# Patient Record
Sex: Female | Born: 1950 | Race: White | Hispanic: No | Smoking: Current every day smoker
Health system: Southern US, Community
[De-identification: ages and names within clinical notes are randomized; demographics above are authoritative.]

## PROBLEM LIST (undated history)

## (undated) ENCOUNTER — Emergency Department (HOSPITAL_BASED_OUTPATIENT_CLINIC_OR_DEPARTMENT_OTHER): Admission: EM | Payer: PPO | Source: Home / Self Care

## (undated) DIAGNOSIS — J449 Chronic obstructive pulmonary disease, unspecified: Secondary | ICD-10-CM

## (undated) DIAGNOSIS — C50919 Malignant neoplasm of unspecified site of unspecified female breast: Secondary | ICD-10-CM

## (undated) DIAGNOSIS — M1990 Unspecified osteoarthritis, unspecified site: Secondary | ICD-10-CM

## (undated) DIAGNOSIS — B029 Zoster without complications: Secondary | ICD-10-CM

## (undated) DIAGNOSIS — T7840XA Allergy, unspecified, initial encounter: Secondary | ICD-10-CM

## (undated) DIAGNOSIS — Z9889 Other specified postprocedural states: Secondary | ICD-10-CM

## (undated) DIAGNOSIS — Z803 Family history of malignant neoplasm of breast: Secondary | ICD-10-CM

## (undated) DIAGNOSIS — K5790 Diverticulosis of intestine, part unspecified, without perforation or abscess without bleeding: Secondary | ICD-10-CM

## (undated) DIAGNOSIS — R112 Nausea with vomiting, unspecified: Secondary | ICD-10-CM

## (undated) DIAGNOSIS — K648 Other hemorrhoids: Secondary | ICD-10-CM

## (undated) DIAGNOSIS — F17200 Nicotine dependence, unspecified, uncomplicated: Secondary | ICD-10-CM

## (undated) DIAGNOSIS — M8580 Other specified disorders of bone density and structure, unspecified site: Secondary | ICD-10-CM

## (undated) DIAGNOSIS — E785 Hyperlipidemia, unspecified: Secondary | ICD-10-CM

## (undated) DIAGNOSIS — Z8049 Family history of malignant neoplasm of other genital organs: Secondary | ICD-10-CM

## (undated) HISTORY — PX: TONSILLECTOMY AND ADENOIDECTOMY: SUR1326

## (undated) HISTORY — PX: COLONOSCOPY: SHX174

## (undated) HISTORY — DX: Other specified disorders of bone density and structure, unspecified site: M85.80

## (undated) HISTORY — DX: Malignant neoplasm of unspecified site of unspecified female breast: C50.919

## (undated) HISTORY — DX: Unspecified osteoarthritis, unspecified site: M19.90

## (undated) HISTORY — DX: Allergy, unspecified, initial encounter: T78.40XA

## (undated) HISTORY — PX: TONSILLECTOMY: SUR1361

## (undated) HISTORY — DX: Chronic obstructive pulmonary disease, unspecified: J44.9

## (undated) HISTORY — DX: Zoster without complications: B02.9

## (undated) HISTORY — DX: Hyperlipidemia, unspecified: E78.5

## (undated) HISTORY — PX: BREAST LUMPECTOMY: SHX2

## (undated) HISTORY — PX: JOINT REPLACEMENT: SHX530

## (undated) HISTORY — DX: Diverticulosis of intestine, part unspecified, without perforation or abscess without bleeding: K57.90

## (undated) HISTORY — DX: Nicotine dependence, unspecified, uncomplicated: F17.200

## (undated) HISTORY — PX: BREAST BIOPSY: SHX20

## (undated) HISTORY — DX: Other hemorrhoids: K64.8

## (undated) HISTORY — PX: DILATION AND EVACUATION: SHX1459

## (undated) HISTORY — DX: Family history of malignant neoplasm of breast: Z80.3

## (undated) HISTORY — DX: Family history of malignant neoplasm of other genital organs: Z80.49

---

## 1997-05-03 ENCOUNTER — Ambulatory Visit (HOSPITAL_COMMUNITY): Admission: RE | Admit: 1997-05-03 | Discharge: 1997-05-03 | Payer: Self-pay | Admitting: Gynecology

## 1997-05-28 ENCOUNTER — Ambulatory Visit (HOSPITAL_BASED_OUTPATIENT_CLINIC_OR_DEPARTMENT_OTHER): Admission: RE | Admit: 1997-05-28 | Discharge: 1997-05-28 | Payer: Self-pay | Admitting: Surgery

## 1997-06-05 ENCOUNTER — Encounter: Admission: RE | Admit: 1997-06-05 | Discharge: 1997-09-03 | Payer: Self-pay | Admitting: Radiation Oncology

## 1997-06-06 ENCOUNTER — Ambulatory Visit (HOSPITAL_COMMUNITY): Admission: RE | Admit: 1997-06-06 | Discharge: 1997-06-07 | Payer: Self-pay | Admitting: Surgery

## 1997-11-21 ENCOUNTER — Encounter: Payer: Self-pay | Admitting: Surgery

## 1997-11-21 ENCOUNTER — Ambulatory Visit (HOSPITAL_COMMUNITY): Admission: RE | Admit: 1997-11-21 | Discharge: 1997-11-21 | Payer: Self-pay | Admitting: Surgery

## 1998-05-01 ENCOUNTER — Other Ambulatory Visit: Admission: RE | Admit: 1998-05-01 | Discharge: 1998-05-01 | Payer: Self-pay | Admitting: Gynecology

## 1998-05-12 ENCOUNTER — Ambulatory Visit (HOSPITAL_COMMUNITY): Admission: RE | Admit: 1998-05-12 | Discharge: 1998-05-12 | Payer: Self-pay | Admitting: Gynecology

## 1998-05-12 ENCOUNTER — Encounter: Payer: Self-pay | Admitting: Gynecology

## 1998-07-04 ENCOUNTER — Ambulatory Visit (HOSPITAL_BASED_OUTPATIENT_CLINIC_OR_DEPARTMENT_OTHER): Admission: RE | Admit: 1998-07-04 | Discharge: 1998-07-04 | Payer: Self-pay | Admitting: Surgery

## 1998-07-04 ENCOUNTER — Encounter: Payer: Self-pay | Admitting: Surgery

## 1998-07-28 ENCOUNTER — Encounter (INDEPENDENT_AMBULATORY_CARE_PROVIDER_SITE_OTHER): Payer: Self-pay | Admitting: Specialist

## 1998-07-28 ENCOUNTER — Other Ambulatory Visit: Admission: RE | Admit: 1998-07-28 | Discharge: 1998-07-28 | Payer: Self-pay | Admitting: Gynecology

## 1999-05-14 ENCOUNTER — Encounter: Payer: Self-pay | Admitting: Gynecology

## 1999-05-14 ENCOUNTER — Encounter: Admission: RE | Admit: 1999-05-14 | Discharge: 1999-05-14 | Payer: Self-pay | Admitting: Gynecology

## 1999-05-27 ENCOUNTER — Other Ambulatory Visit: Admission: RE | Admit: 1999-05-27 | Discharge: 1999-05-27 | Payer: Self-pay | Admitting: Gynecology

## 1999-09-25 ENCOUNTER — Encounter (INDEPENDENT_AMBULATORY_CARE_PROVIDER_SITE_OTHER): Payer: Self-pay | Admitting: Specialist

## 1999-09-25 ENCOUNTER — Other Ambulatory Visit: Admission: RE | Admit: 1999-09-25 | Discharge: 1999-09-25 | Payer: Self-pay | Admitting: Gynecology

## 2000-05-17 ENCOUNTER — Encounter: Admission: RE | Admit: 2000-05-17 | Discharge: 2000-05-17 | Payer: Self-pay | Admitting: Gynecology

## 2000-05-17 ENCOUNTER — Encounter: Payer: Self-pay | Admitting: Gynecology

## 2000-06-02 ENCOUNTER — Other Ambulatory Visit: Admission: RE | Admit: 2000-06-02 | Discharge: 2000-06-02 | Payer: Self-pay | Admitting: Internal Medicine

## 2001-05-18 ENCOUNTER — Encounter: Payer: Self-pay | Admitting: Family Medicine

## 2001-05-18 ENCOUNTER — Encounter: Admission: RE | Admit: 2001-05-18 | Discharge: 2001-05-18 | Payer: Self-pay | Admitting: Family Medicine

## 2001-06-02 ENCOUNTER — Other Ambulatory Visit: Admission: RE | Admit: 2001-06-02 | Discharge: 2001-06-02 | Payer: Self-pay | Admitting: Gynecology

## 2001-06-09 ENCOUNTER — Encounter: Payer: Self-pay | Admitting: Gynecology

## 2001-06-09 ENCOUNTER — Encounter: Admission: RE | Admit: 2001-06-09 | Discharge: 2001-06-09 | Payer: Self-pay | Admitting: Gynecology

## 2001-09-08 ENCOUNTER — Other Ambulatory Visit: Admission: RE | Admit: 2001-09-08 | Discharge: 2001-09-08 | Payer: Self-pay | Admitting: Gynecology

## 2001-11-27 ENCOUNTER — Other Ambulatory Visit: Admission: RE | Admit: 2001-11-27 | Discharge: 2001-11-27 | Payer: Self-pay | Admitting: Gynecology

## 2002-02-16 ENCOUNTER — Other Ambulatory Visit: Admission: RE | Admit: 2002-02-16 | Discharge: 2002-02-16 | Payer: Self-pay | Admitting: Gynecology

## 2002-06-21 ENCOUNTER — Other Ambulatory Visit: Admission: RE | Admit: 2002-06-21 | Discharge: 2002-06-21 | Payer: Self-pay | Admitting: Gynecology

## 2002-06-22 ENCOUNTER — Encounter: Admission: RE | Admit: 2002-06-22 | Discharge: 2002-06-22 | Payer: Self-pay | Admitting: Gynecology

## 2002-06-22 ENCOUNTER — Encounter: Payer: Self-pay | Admitting: Gynecology

## 2002-10-10 ENCOUNTER — Encounter: Payer: Self-pay | Admitting: Gastroenterology

## 2002-10-10 ENCOUNTER — Encounter: Admission: RE | Admit: 2002-10-10 | Discharge: 2002-10-10 | Payer: Self-pay | Admitting: Gastroenterology

## 2002-12-07 ENCOUNTER — Ambulatory Visit (HOSPITAL_COMMUNITY): Admission: RE | Admit: 2002-12-07 | Discharge: 2002-12-07 | Payer: Self-pay | Admitting: Gastroenterology

## 2002-12-19 ENCOUNTER — Ambulatory Visit (HOSPITAL_COMMUNITY): Admission: RE | Admit: 2002-12-19 | Discharge: 2002-12-19 | Payer: Self-pay | Admitting: Gastroenterology

## 2003-05-22 ENCOUNTER — Other Ambulatory Visit: Admission: RE | Admit: 2003-05-22 | Discharge: 2003-05-22 | Payer: Self-pay | Admitting: Gynecology

## 2003-05-24 ENCOUNTER — Encounter: Admission: RE | Admit: 2003-05-24 | Discharge: 2003-05-24 | Payer: Self-pay | Admitting: Oncology

## 2003-06-07 ENCOUNTER — Encounter: Admission: RE | Admit: 2003-06-07 | Discharge: 2003-06-07 | Payer: Self-pay | Admitting: Gynecology

## 2003-10-04 ENCOUNTER — Ambulatory Visit (HOSPITAL_COMMUNITY): Admission: RE | Admit: 2003-10-04 | Discharge: 2003-10-04 | Payer: Self-pay | Admitting: Gynecology

## 2004-05-18 ENCOUNTER — Ambulatory Visit: Payer: Self-pay | Admitting: Oncology

## 2004-05-19 ENCOUNTER — Other Ambulatory Visit: Admission: RE | Admit: 2004-05-19 | Discharge: 2004-05-19 | Payer: Self-pay | Admitting: Gynecology

## 2004-05-25 ENCOUNTER — Encounter: Admission: RE | Admit: 2004-05-25 | Discharge: 2004-05-25 | Payer: Self-pay | Admitting: Oncology

## 2005-05-20 ENCOUNTER — Other Ambulatory Visit: Admission: RE | Admit: 2005-05-20 | Discharge: 2005-05-20 | Payer: Self-pay | Admitting: Gynecology

## 2005-05-26 ENCOUNTER — Encounter: Admission: RE | Admit: 2005-05-26 | Discharge: 2005-05-26 | Payer: Self-pay | Admitting: Gynecology

## 2005-09-22 ENCOUNTER — Ambulatory Visit (HOSPITAL_COMMUNITY): Admission: RE | Admit: 2005-09-22 | Discharge: 2005-09-22 | Payer: Self-pay | Admitting: Gynecology

## 2006-03-03 ENCOUNTER — Emergency Department (HOSPITAL_COMMUNITY): Admission: EM | Admit: 2006-03-03 | Discharge: 2006-03-03 | Payer: Self-pay | Admitting: Family Medicine

## 2006-05-23 ENCOUNTER — Other Ambulatory Visit: Admission: RE | Admit: 2006-05-23 | Discharge: 2006-05-23 | Payer: Self-pay | Admitting: Gynecology

## 2006-05-24 ENCOUNTER — Encounter: Admission: RE | Admit: 2006-05-24 | Discharge: 2006-05-24 | Payer: Self-pay | Admitting: Gynecology

## 2007-05-26 ENCOUNTER — Other Ambulatory Visit: Admission: RE | Admit: 2007-05-26 | Discharge: 2007-05-26 | Payer: Self-pay | Admitting: Gynecology

## 2007-05-30 ENCOUNTER — Encounter: Admission: RE | Admit: 2007-05-30 | Discharge: 2007-05-30 | Payer: Self-pay | Admitting: Gynecology

## 2007-06-23 ENCOUNTER — Ambulatory Visit (HOSPITAL_COMMUNITY): Admission: RE | Admit: 2007-06-23 | Discharge: 2007-06-23 | Payer: Self-pay | Admitting: Gynecology

## 2007-06-27 ENCOUNTER — Encounter: Admission: RE | Admit: 2007-06-27 | Discharge: 2007-06-27 | Payer: Self-pay | Admitting: Gynecology

## 2007-11-10 ENCOUNTER — Ambulatory Visit: Payer: Self-pay | Admitting: Internal Medicine

## 2007-11-27 ENCOUNTER — Ambulatory Visit: Payer: Self-pay | Admitting: Internal Medicine

## 2007-11-27 LAB — HM COLONOSCOPY

## 2007-12-11 ENCOUNTER — Emergency Department (HOSPITAL_COMMUNITY): Admission: EM | Admit: 2007-12-11 | Discharge: 2007-12-11 | Payer: Self-pay | Admitting: Family Medicine

## 2008-05-27 ENCOUNTER — Encounter: Payer: Self-pay | Admitting: Gynecology

## 2008-05-27 ENCOUNTER — Encounter: Payer: Self-pay | Admitting: Internal Medicine

## 2008-05-27 ENCOUNTER — Other Ambulatory Visit: Admission: RE | Admit: 2008-05-27 | Discharge: 2008-05-27 | Payer: Self-pay | Admitting: Gynecology

## 2008-05-27 ENCOUNTER — Ambulatory Visit: Payer: Self-pay | Admitting: Gynecology

## 2008-05-30 ENCOUNTER — Encounter: Admission: RE | Admit: 2008-05-30 | Discharge: 2008-05-30 | Payer: Self-pay | Admitting: Gynecology

## 2008-08-27 ENCOUNTER — Encounter: Payer: Self-pay | Admitting: Internal Medicine

## 2008-08-27 LAB — CONVERTED CEMR LAB
Cholesterol: 234 mg/dL
HDL: 90 mg/dL
LDL (calc): 2.6 mg/dL
LDL Cholesterol: 134 mg/dL
TSH: 0.88 microintl units/mL
Triglyceride fasting, serum: 48 mg/dL

## 2008-11-04 ENCOUNTER — Ambulatory Visit: Payer: Self-pay | Admitting: Internal Medicine

## 2008-11-04 DIAGNOSIS — Z9189 Other specified personal risk factors, not elsewhere classified: Secondary | ICD-10-CM | POA: Insufficient documentation

## 2008-11-04 DIAGNOSIS — F17200 Nicotine dependence, unspecified, uncomplicated: Secondary | ICD-10-CM | POA: Insufficient documentation

## 2008-11-04 DIAGNOSIS — J449 Chronic obstructive pulmonary disease, unspecified: Secondary | ICD-10-CM | POA: Insufficient documentation

## 2008-11-04 DIAGNOSIS — M8580 Other specified disorders of bone density and structure, unspecified site: Secondary | ICD-10-CM

## 2008-11-05 ENCOUNTER — Encounter: Payer: Self-pay | Admitting: Internal Medicine

## 2009-05-29 ENCOUNTER — Other Ambulatory Visit: Admission: RE | Admit: 2009-05-29 | Discharge: 2009-05-29 | Payer: Self-pay | Admitting: Gynecology

## 2009-05-29 ENCOUNTER — Ambulatory Visit: Payer: Self-pay | Admitting: Gynecology

## 2009-06-04 ENCOUNTER — Encounter: Admission: RE | Admit: 2009-06-04 | Discharge: 2009-06-04 | Payer: Self-pay | Admitting: Gynecology

## 2009-10-30 ENCOUNTER — Encounter: Admission: RE | Admit: 2009-10-30 | Discharge: 2009-10-30 | Payer: Self-pay | Admitting: Gynecology

## 2010-01-01 ENCOUNTER — Ambulatory Visit (HOSPITAL_COMMUNITY)
Admission: RE | Admit: 2010-01-01 | Discharge: 2010-01-01 | Payer: Self-pay | Source: Home / Self Care | Attending: Gynecology | Admitting: Gynecology

## 2010-01-24 ENCOUNTER — Encounter: Payer: Self-pay | Admitting: Gastroenterology

## 2010-02-01 LAB — CONVERTED CEMR LAB: Pap Smear: NORMAL

## 2010-02-23 ENCOUNTER — Encounter: Payer: Self-pay | Admitting: Internal Medicine

## 2010-02-23 ENCOUNTER — Ambulatory Visit (INDEPENDENT_AMBULATORY_CARE_PROVIDER_SITE_OTHER): Payer: 59 | Admitting: Internal Medicine

## 2010-02-23 DIAGNOSIS — B029 Zoster without complications: Secondary | ICD-10-CM | POA: Insufficient documentation

## 2010-02-23 DIAGNOSIS — M949 Disorder of cartilage, unspecified: Secondary | ICD-10-CM

## 2010-03-03 NOTE — Assessment & Plan Note (Signed)
Summary: RASH / BUG BITE? / NWS #   Vital Signs:  Patient profile:   60 year old female Height:      66 inches (167.64 cm) Weight:      120.25 pounds (54.66 kg) BMI:     19.48 O2 Sat:      97 % on Room air Temp:     98.9 degrees F (37.17 degrees C) oral Pulse rate:   86 / minute Pulse rhythm:   regular BP sitting:   118 / 74  (left arm) Cuff size:   regular  Vitals Entered By: Brenton Grills CMA Duncan Dull) (February 23, 2010 10:53 AM)  O2 Flow:  Room air CC: Rash on buttocks x 9 days, redness, tender to touch/aj Is Patient Diabetic? No Comments Pt is no longer taking Evista (Joint pain)   Primary Care Provider:  Newt Lukes MD  CC:  Rash on buttocks x 9 days, redness, and tender to touch/aj.  History of Present Illness: c/o rash onset 10d ago located on right buttock denes outdoor exposure or known bug bite no rash elsewhere on body preceeded by itch and tingling - then red patch, then blisters and red nodules currently, dried scabs with mild  and tender pain, not swollen or red symptoms worse after hot bath and not improved with otc meds no fever no hx same  arthritis -  prior left knee pain and right rib pain all pain resolved within 2 weeks of stopping evista  osteopenia - follows with gyn for same - reviewed Ca and vit d recs  Clinical Review Panels:  Immunizations   Last Flu Vaccine:  Historical (09/04/2008)   Last Pneumovax:  Pneumovax (11/04/2008)  Lipid Management   Cholesterol:  234 (08/27/2008)   LDL (bad choesterol):  134 (08/27/2008)   HDL (good cholesterol):  90 (08/27/2008)   Triglycerides:  48 (08/27/2008)   Current Medications (verified): 1)  Evista 60 Mg Tabs (Raloxifene Hcl) .... Take 1 Po Qd 2)  Calcium Carbonate 600 Mg Tabs (Calcium Carbonate) .... Take 2 By Mouth Qd 3)  Flexamin W/vitamin D .... Take 1 By Mouth Qd 4)  Multivitamins  Tabs (Multiple Vitamin) .Marland Kitchen.. 1 By Mouth Once Daily  Allergies (verified): 1)  ! * Tetanus 2)  !  Codeine  Past History:  Past Medical History: tobacco abuse osteopenia OA - left knee  MD roster gyn - fernedez  Social History: Current Smoker married, lives with spouse  works in Water quality scientist for [employer] -  occ alcohol use - social  Review of Systems  The patient denies fever, weight loss, chest pain, and abdominal pain.    Physical Exam  General:  thin, alert, well-developed, well-nourished, and cooperative to examination.   tobacco smell in room Lungs:  normal respiratory effort, no intercostal retractions or use of accessory muscles; normal breath sounds bilaterally - no crackles and no wheezes.    Heart:  normal rate, regular rhythm, no murmur, and no rub. BLE without edema. normal DP pulses and normal cap refill in all 4 extremities    Skin:  resolving patch of shingles blisters localized over 4x6cm patch over right buttock - no cellulitis or oozing   Impression & Recommendations:  Problem # 1:  SHINGLES (ICD-053.9)  minimally symptomatic already past stage for antivirals given >1 week outbreak use topical steroids as needed - erx done no evidence for PHN at this time but pt educated on risk of same  Orders: Prescription Created Electronically [phone])  Problem #  2:  OSTEOPENIA (ICD-733.90)  The following medications were removed from the medication list:    Evista 60 Mg Tabs (Raloxifene hcl) .Marland Kitchen... Take 1 po qd  followed and tx for same by gyn -  intol of evista rec vit d 2000u/d and cal citrate until further f/u with gyn done  Complete Medication List: 1)  Calcium Carbonate 600 Mg Tabs (Calcium carbonate) .... Take 2 by mouth once daily 2)  Flexamin W/vitamin D  .... Take 1 by mouth qd 3)  Multivitamins Tabs (Multiple vitamin) .Marland Kitchen.. 1 by mouth once daily 4)  Betamethasone Dipropionate 0.05 % Oint (Betamethasone dipropionate) .... Apply to affected skin two times a day as needed  Patient Instructions: 1)  it was good to see you today. 2)  rash is an  outbreak of shingles - use steroid ointment as needed but no other treatment recommended at this time 3)  you are not contagious at this time 4)  your prescription has been electronically submitted to your pharmacy. Please take as directed. Contact our office if you believe you're having problems with the medication(s).  5)  Please schedule a follow-up appointment annually for physical and labs, call sooner if problems.  Prescriptions: BETAMETHASONE DIPROPIONATE 0.05 % OINT (BETAMETHASONE DIPROPIONATE) apply to affected skin two times a day as needed  #1 x 0   Entered and Authorized by:   Newt Lukes MD   Signed by:   Newt Lukes MD on 02/23/2010   Method used:   Electronically to        [facility]* (retail)       [address]       [address] Shipping/mailing       [place], [place]  [address]       Ph: [phone]       Fax: [phone]   RxID:   [id number]    Orders Added: 1)  Est. Patient Level IV [27253] 2)  Prescription Created Electronically [phone]

## 2010-03-06 ENCOUNTER — Encounter: Payer: Self-pay | Admitting: Internal Medicine

## 2010-03-06 ENCOUNTER — Ambulatory Visit (INDEPENDENT_AMBULATORY_CARE_PROVIDER_SITE_OTHER): Payer: 59 | Admitting: Internal Medicine

## 2010-03-06 DIAGNOSIS — J180 Bronchopneumonia, unspecified organism: Secondary | ICD-10-CM

## 2010-03-12 NOTE — Assessment & Plan Note (Signed)
Summary: BRONCHITIS /NWS   Vital Signs:  Patient profile:   60 year old female O2 Sat:      97 % on Room air Temp:     101.3 degrees F (38.50 degrees C) oral Pulse rate:   93 / minute BP sitting:   122 / 70  (left arm) Cuff size:   regular  Vitals Entered By: Orlan Leavens RMA (March 06, 2010 11:45 AM)  O2 Flow:  Room air CC: ? Bronchitis, URI symptoms Is Patient Diabetic? No Pain Assessment Patient in pain? no        Primary Care Provider:  Newt Lukes MD  CC:  ? Bronchitis and URI symptoms.  History of Present Illness:  URI Symptoms      This is a 60 year old woman who presents with URI symptoms.  The symptoms began 4 days ago.  The severity is described as moderate.  The patient reports sore throat, dry cough, productive cough, and sick contacts, but denies nasal congestion, clear nasal discharge, purulent nasal discharge, and earache.  Associated symptoms include fever of 100.5-103 degrees.  The patient denies stiff neck, dyspnea, wheezing, rash, vomiting, and diarrhea.  The patient also reports muscle aches.  The patient denies itchy throat, sneezing, seasonal symptoms, response to antihistamine, headache, and severe fatigue.  The patient denies the following risk factors for Strep sinusitis: double sickening, tooth pain, Strep exposure, tender adenopathy, and absence of cough.    also reviewed chronic med issuesL arthritis -  prior left knee pain and right rib pain all pain resolved within 2 weeks of stopping evista  osteopenia - follows with gyn for same - reviewed Ca and vit d recs  Current Medications (verified): 1)  Calcium Carbonate 600 Mg Tabs (Calcium Carbonate) .... Take 2 By Mouth Once Daily 2)  Flexamin W/vitamin D .... Take 1 By Mouth Qd 3)  Multivitamins  Tabs (Multiple Vitamin) .Marland Kitchen.. 1 By Mouth Once Daily 4)  Betamethasone Dipropionate 0.05 % Oint (Betamethasone Dipropionate) .... Apply To Affected Skin Two Times A Day As Needed  Allergies  (verified): 1)  ! * Tetanus 2)  ! Codeine  Past History:  Past Medical History: tobacco abuse osteopenia OA - left knee  MD roster: gyn - fernedez  Review of Systems  The patient denies chest pain, syncope, headaches, and hemoptysis.    Physical Exam  General:  thin, alert, well-developed, well-nourished, and cooperative to examination. tired and ill appearing.  tobacco smell in room Eyes:  vision grossly intact; pupils equal, round and reactive to light.  conjunctiva and lids normal.    Ears:  normal pinnae bilaterally, without erythema, swelling, or tenderness to palpation. TMs clear, without effusion, or cerumen impaction. Hearing grossly normal bilaterally  Mouth:  teeth and gums in good repair; mucous membranes moist, without lesions or ulcers. oropharynx clear without exudate, mod erythema. tiny erythematous nodule (1mm) on left side of OP - noninflammed - no tonsils Lungs:  coarse bronchial BS bilaterally with deep cough but no crackle or wheeze Heart:  normal rate, regular rhythm, no murmur, and no rub. BLE without edema. Psych:  Oriented X3, memory intact for recent and remote, normally interactive, good eye contact, not anxious appearing, not depressed appearing, and not agitated.      Impression & Recommendations:  Problem # 1:  BRONCHOPNEUMONIA (ICD-485)  fever but O2 sat ok - wll forgo cxr as results would not change tx plan for infx: 10d FQ cont otc cough med (declines  offered rx for narc cough suppressant), hydration callif unimproved, sooner if worse again reminded to stop smoking! Her updated medication list for this problem includes:    Levaquin 750 Mg Tabs (Levofloxacin) .Marland Kitchen... 1 by mouth once daily  Instructed patient to complete antibiotics, and call for worsened shortness of breath or new symptoms.   Orders: Prescription Created Electronically [phone])  Complete Medication List: 1)  Calcium Carbonate 600 Mg Tabs (Calcium carbonate) .... Take 2 by  mouth once daily 2)  Flexamin W/vitamin D  .... Take 1 by mouth qd 3)  Multivitamins Tabs (Multiple vitamin) .Marland Kitchen.. 1 by mouth once daily 4)  Betamethasone Dipropionate 0.05 % Oint (Betamethasone dipropionate) .... Apply to affected skin two times a day as needed 5)  Levaquin 750 Mg Tabs (Levofloxacin) .Marland Kitchen.. 1 by mouth once daily 6)  Robitussin Dm 100-10 Mg/66ml Syrp (Dextromethorphan-guaifenesin) .... As needed for cough  Patient Instructions: 1)  it was good to see you today. 2)  Levaquin for bronchopneumonia symptoms - your prescription has been electronically submitted to your pharmacy. Please take as directed. Contact our office if you believe you're having problems with the medication(s).  3)  Get plenty of rest, drink lots of clear liquids, and use Tylenol or Ibuprofen for fever and comfort. Robitussin DM for cough - call if something stronger is needed. Return in 7-10 days if you're not better:sooner if you're feeling worse. Prescriptions: LEVAQUIN 750 MG TABS (LEVOFLOXACIN) 1 by mouth once daily  #10 x 0   Entered and Authorized by:   Newt Lukes MD   Signed by:   Newt Lukes MD on 03/06/2010   Method used:   Electronically to        [facility]* (retail)       [address]       [address] Shipping/mailing       [place], [place]  [address]       Ph: [phone]       Fax: [phone]   RxID:   [id number]    Orders Added: 1)  Est. Patient Level IV [56387] 2)  Prescription Created Electronically [phone]

## 2010-03-16 ENCOUNTER — Other Ambulatory Visit: Payer: Self-pay | Admitting: Dermatology

## 2010-05-22 NOTE — Op Note (Signed)
NAME:  Tanya [name], Tanya [name]                         ACCOUNT NO.:  [id number]   MEDICAL RECORD NO.:  [id number]                   PATIENT TYPE:  AMB   LOCATION:  ENDO                                 FACILITY:   PHYSICIAN:  Anselmo Rod, M.D.               DATE OF BIRTH:  [birth year]   DATE OF PROCEDURE:  12/07/2002  DATE OF DISCHARGE:                                 OPERATIVE REPORT   PROCEDURE PERFORMED:  Screening colonoscopy.   ENDOSCOPIST:  Anselmo Rod, M.D.   INSTRUMENT USED:  Olympus video colonoscope changed to a pediatric  adjustable colonoscope.   INDICATIONS FOR PROCEDURE:  A 60 year old white female with a personal  history of breast cancer, rectal bleeding undergoing screening colonoscopy  to rule out colonic polyps, masses, etc.   PREPROCEDURE PREPARATION:  Informed consent was procured from the patient.  The patient was fasted for eight hours prior to procedure and prepped with a  bottle of magnesium citrate and gallon of GoLYTELY the night prior to  procedure.   PREPROCEDURE PHYSICAL:  VITAL SIGNS:  The patient has stable vital signs.  NECK:  Supple.  CHEST:  Clear to auscultation.  CARDIAC:  S1, S2 regular.  ABDOMEN:  Soft with normal bowel sounds.   DESCRIPTION OF PROCEDURE:  The patient was placed in the left lateral  decubitus position and sedated with 80 mg of Demerol and 8 mg of Versed  intravenously. Once the patient was adequately sedated and maintained on low-  flow oxygen and continuous cardiac monitoring, the Olympus video colonoscope  was advanced from the rectum to the hepatic flexure with difficulty and  therefore this was withdrawn and the peds adjustable colonoscope was used  instead.  The patient's position was changed from the left decubitus to  spine.  The patient positioned with gentle application of abdominal pressure  to reach the cecum.  The appendicular orifice and ileocecal valve were  clearly visualized and photographed.  The  terminal ileum appeared normal and  without lesion.  Retroflexion in the rectum revealed small internal  hemorrhoids which I suspect is the patient's source of rectal bleeding.  There was some extrinsic compression of the rectum, but the reason for this  is unclear to me.  The patient tolerated the procedure well without  complication.   IMPRESSION:  1. Small internal hemorrhoid.  2. Extrinsic compression of the rectum. Question uterine fibroid.  Symptoms     __________.  Etiology unclear.  3. No masses, polyps or diverticula seen.  4. Normal appearing colonic mucosa up to terminal ileum.   RECOMMENDATIONS:  1. Pelvic ultrasound with next gynecologic evaluation.  2. High-fiber diet with liberal fluid intake.  3. Repeat CRC screening in the next five years unless the patient develops     any abnormal symptoms in the interim.  4. Outpatient followup as need arises in the future.  Anselmo Rod, M.D.    JNM/MEDQ  D:  12/07/2002  T:  12/07/2002  Job:  [id number]   cc:   Gaetano Hawthorne. Lily Peer, M.D.  [address]  [place]  [place] [address]  Fax: [phone]   Mosetta Putt, M.D.  [address] [place]  [place] [address]  Fax: [phone]   Genene Churn. Cyndie Chime, M.D.  [address] [facility]  [place]  [place] [address]  Fax: [phone]

## 2010-05-29 ENCOUNTER — Other Ambulatory Visit: Payer: Self-pay | Admitting: Gynecology

## 2010-05-29 DIAGNOSIS — Z1231 Encounter for screening mammogram for malignant neoplasm of breast: Secondary | ICD-10-CM

## 2010-06-22 ENCOUNTER — Other Ambulatory Visit: Payer: 59

## 2010-06-22 ENCOUNTER — Encounter: Payer: 59 | Admitting: Gynecology

## 2010-06-23 ENCOUNTER — Ambulatory Visit
Admission: RE | Admit: 2010-06-23 | Discharge: 2010-06-23 | Disposition: A | Discharge status: Home / Self Care | Payer: 59 | Source: Ambulatory Visit | Attending: Gynecology | Admitting: Gynecology

## 2010-06-23 DIAGNOSIS — Z1231 Encounter for screening mammogram for malignant neoplasm of breast: Secondary | ICD-10-CM

## 2010-07-02 ENCOUNTER — Encounter: Payer: 59 | Admitting: Gynecology

## 2010-07-03 ENCOUNTER — Other Ambulatory Visit: Payer: Self-pay | Admitting: Gynecology

## 2010-07-03 ENCOUNTER — Encounter (INDEPENDENT_AMBULATORY_CARE_PROVIDER_SITE_OTHER): Payer: 59 | Admitting: Gynecology

## 2010-07-03 ENCOUNTER — Other Ambulatory Visit: Payer: 59

## 2010-07-03 ENCOUNTER — Other Ambulatory Visit (HOSPITAL_COMMUNITY)
Admission: RE | Admit: 2010-07-03 | Discharge: 2010-07-03 | Disposition: A | Discharge status: Home / Self Care | Payer: 59 | Source: Ambulatory Visit | Attending: Gynecology | Admitting: Gynecology

## 2010-07-03 DIAGNOSIS — R634 Abnormal weight loss: Secondary | ICD-10-CM

## 2010-07-03 DIAGNOSIS — Z124 Encounter for screening for malignant neoplasm of cervix: Secondary | ICD-10-CM | POA: Insufficient documentation

## 2010-07-03 DIAGNOSIS — Z01419 Encounter for gynecological examination (general) (routine) without abnormal findings: Secondary | ICD-10-CM

## 2010-07-03 DIAGNOSIS — Z833 Family history of diabetes mellitus: Secondary | ICD-10-CM

## 2010-07-03 DIAGNOSIS — Z13220 Encounter for screening for lipoid disorders: Secondary | ICD-10-CM

## 2010-07-03 DIAGNOSIS — D259 Leiomyoma of uterus, unspecified: Secondary | ICD-10-CM

## 2010-07-03 DIAGNOSIS — Z1211 Encounter for screening for malignant neoplasm of colon: Secondary | ICD-10-CM

## 2010-07-03 DIAGNOSIS — R823 Hemoglobinuria: Secondary | ICD-10-CM

## 2010-07-03 LAB — LIPID PANEL
Cholesterol: 225 mg/dL — AB (ref 0–200)
HDL: 100 mg/dL — AB (ref 35–70)

## 2010-07-03 LAB — CBC AND DIFFERENTIAL: HCT: 45 % (ref 36–46)

## 2010-07-03 LAB — TSH: TSH: 0.87 u[IU]/mL (ref 0.41–5.90)

## 2010-07-10 ENCOUNTER — Encounter: Payer: Self-pay | Admitting: *Deleted

## 2010-07-13 ENCOUNTER — Encounter: Payer: Self-pay | Admitting: Internal Medicine

## 2011-01-22 ENCOUNTER — Other Ambulatory Visit: Payer: Self-pay | Admitting: Gynecology

## 2011-05-26 ENCOUNTER — Other Ambulatory Visit: Payer: Self-pay | Admitting: Gynecology

## 2011-05-26 ENCOUNTER — Telehealth: Payer: Self-pay | Admitting: *Deleted

## 2011-05-26 DIAGNOSIS — Z1231 Encounter for screening mammogram for malignant neoplasm of breast: Secondary | ICD-10-CM

## 2011-05-26 DIAGNOSIS — Z0000 Encounter for general adult medical examination without abnormal findings: Secondary | ICD-10-CM

## 2011-05-26 NOTE — Telephone Encounter (Signed)
Besides her ultrasound she would have a CBC, fasting lipid profile, fasting blood sugar and urinalysis and TSH for her visit.

## 2011-05-26 NOTE — Telephone Encounter (Signed)
Pt has annual schedule for June 5th and would like to know if she will have pelvic ultrasound done this year as well? Pt said the reason for ultrasound was due to history of breast cancer. She would also like to know if additional blood work other than fasting cholesterol were going to be order? Please advise

## 2011-05-27 NOTE — Telephone Encounter (Signed)
Pt informed with the below, orders placed in computer.

## 2011-06-07 ENCOUNTER — Other Ambulatory Visit: Payer: Self-pay | Admitting: Gynecology

## 2011-06-07 DIAGNOSIS — Z853 Personal history of malignant neoplasm of breast: Secondary | ICD-10-CM

## 2011-06-09 ENCOUNTER — Encounter: Payer: Self-pay | Admitting: Gynecology

## 2011-06-09 ENCOUNTER — Other Ambulatory Visit: Payer: 59

## 2011-06-09 ENCOUNTER — Ambulatory Visit (INDEPENDENT_AMBULATORY_CARE_PROVIDER_SITE_OTHER): Payer: 59 | Admitting: Gynecology

## 2011-06-09 ENCOUNTER — Ambulatory Visit (INDEPENDENT_AMBULATORY_CARE_PROVIDER_SITE_OTHER): Payer: 59

## 2011-06-09 VITALS — BP 118/70 | Ht 64.5 in | Wt 118.0 lb

## 2011-06-09 DIAGNOSIS — Z0000 Encounter for general adult medical examination without abnormal findings: Secondary | ICD-10-CM

## 2011-06-09 DIAGNOSIS — Z01419 Encounter for gynecological examination (general) (routine) without abnormal findings: Secondary | ICD-10-CM

## 2011-06-09 DIAGNOSIS — D251 Intramural leiomyoma of uterus: Secondary | ICD-10-CM

## 2011-06-09 DIAGNOSIS — N83339 Acquired atrophy of ovary and fallopian tube, unspecified side: Secondary | ICD-10-CM

## 2011-06-09 DIAGNOSIS — C50912 Malignant neoplasm of unspecified site of left female breast: Secondary | ICD-10-CM | POA: Insufficient documentation

## 2011-06-09 DIAGNOSIS — Z853 Personal history of malignant neoplasm of breast: Secondary | ICD-10-CM

## 2011-06-09 DIAGNOSIS — D259 Leiomyoma of uterus, unspecified: Secondary | ICD-10-CM

## 2011-06-09 LAB — CBC
Hemoglobin: 15.1 g/dL — ABNORMAL HIGH (ref 12.0–15.0)
MCH: 33.7 pg (ref 26.0–34.0)
MCHC: 33.7 g/dL (ref 30.0–36.0)
Platelets: 173 10*3/uL (ref 150–400)
RBC: 4.48 MIL/uL (ref 3.87–5.11)

## 2011-06-09 LAB — LIPID PANEL: Total CHOL/HDL Ratio: 2.9 Ratio

## 2011-06-09 LAB — TSH: TSH: 1.39 u[IU]/mL (ref 0.350–4.500)

## 2011-06-09 LAB — GLUCOSE, RANDOM: Glucose, Bld: 89 mg/dL (ref 70–99)

## 2011-06-09 NOTE — Progress Notes (Signed)
Tanya [name] 06/11/1950 [id number]   History:    61 y.o.  for annual gyn exam with no complaints. 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 Patient's treatment consisted of a left lumpectomy along with radiation and 5 years of tamoxifen. Patient currently on Evista 60 mg daily for osteopenia. Patient is a chronic smoker of 20 years. Patient had a normal chest x-ray in 2011. Her last mammogram was in June of 2012 normal. Patient does her monthly self breast examination. Last bone density study October 2011 with her lowest T score at the left femoral neck -1.3 (decreased bone mineralization/osteopenia). Patient currently taking calcium and vitamin D twice a day. Last colonoscopy was normal in 2009. In 2012 patient had outbreak of shingles and has not had a vaccine yet.  Patient had ultrasound today as a result of prior history of uterine fibroids and history of breast cancer for assessment of her ovaries. Ultrasound demonstrated a uterus that measured 5.5 x 4.1 x by 2.3 cm with endometrial stripe of 1.4 mm 2 small less than 1 cm fibroids were noted. Right and left ovary were atrophic.  Past medical history,surgical history, family history and social history were all reviewed and documented in the EPIC chart.  Gynecologic History No LMP recorded. Patient is postmenopausal. Contraception: none Last Pap: 2012. Results were: normal Last mammogram: 2012. Results were: normal  Obstetric History OB History    Grav Para Term Preterm Abortions TAB SAB Ect Mult Living   3 0   2  1   0     # Outc Date GA Lbr Len/2nd Wgt Sex Del Anes PTL Lv   1 GRA            2 ABT            3 SAB                ROS: A ROS was performed and pertinent positives and negatives are included in the history.  GENERAL: No fevers or chills. HEENT: No change in vision, no earache, sore throat or sinus congestion. NECK: No pain or stiffness. CARDIOVASCULAR: No  chest pain or pressure. No palpitations. PULMONARY: No shortness of breath, cough or wheeze. GASTROINTESTINAL: No abdominal pain, nausea, vomiting or diarrhea, melena or bright red blood per rectum. GENITOURINARY: No urinary frequency, urgency, hesitancy or dysuria. MUSCULOSKELETAL: No joint or muscle pain, no back pain, no recent trauma. DERMATOLOGIC: No rash, no itching, no lesions. ENDOCRINE: No polyuria, polydipsia, no heat or cold intolerance. No recent change in weight. HEMATOLOGICAL: No anemia or easy bruising or bleeding. NEUROLOGIC: No headache, seizures, numbness, tingling or weakness. PSYCHIATRIC: No depression, no loss of interest in normal activity or change in sleep pattern.     Exam: chaperone present  BP 118/70  Ht 5' 4.5" (1.638 m)  Wt 118 lb (53.524 kg)  BMI 19.94 kg/m2  Body mass index is 19.94 kg/(m^2).  General appearance : Well developed well nourished female. No acute distress HEENT: Neck supple, trachea midline, no carotid bruits, no thyroidmegaly Lungs: Clear to auscultation, no rhonchi or wheezes, or rib retractions  Heart: Regular rate and rhythm, no murmurs or gallops Breast:Examined in sitting and supine position were symmetrical in appearance, no palpable masses or tenderness,  no skin retraction, no nipple inversion, no nipple discharge, no skin discoloration, no axillary or supraclavicular lymphadenopathy Abdomen: no palpable masses or tenderness, no rebound or guarding Extremities: no edema or skin  discoloration or tenderness  Pelvic:  Bartholin, Urethra, Skene Glands: Within normal limits             Vagina: No gross lesions or discharge  Cervix: No gross lesions or discharge  Uterus  anteverted, normal size, shape and consistency, non-tender and mobile  Adnexa  Without masses or tenderness  Anus and perineum  normal   Rectovaginal  normal sphincter tone without palpated masses or tenderness             Hemoccult cards provided for patient's mid to the  office for testing     Assessment/Plan:  61 y.o. female for annual exam with prior history of ductal carcinoma in situ with microinvasion in 1999 has done well. Patient continues to smoke at least one pack cigarette per day despite numerous times that she has been counseled and offered treatment but has declined. Chest x-ray normal 2 years ago. She scheduled for mammogram in the next few weeks. Her next bone density study is due at the end of the year. She was instructed to continue to take her calcium and vitamin D twice a day and to engage in some form of weightbearing exercises. The following labs were drawn today: CBC, fasting lipid profile, fasting blood sugar, TSH and urinalysis. New past year screening guidelines discussed. No Pap smear done today. Literature information on smoking cessation was provided. Fecal occult blood testing card provided her to submit to the office at a later date for testing. Patient to continue her Evista 60 mg daily for osteopenia. Prescription was provided for the shingles vaccine.   Ok Edwards MD, 10:58 AM 06/09/2011

## 2011-06-10 LAB — URINALYSIS W MICROSCOPIC + REFLEX CULTURE
Hgb urine dipstick: NEGATIVE
Leukocytes, UA: NEGATIVE
Nitrite: NEGATIVE
Protein, ur: NEGATIVE mg/dL
Urobilinogen, UA: 0.2 mg/dL (ref 0.0–1.0)
pH: 7.5 (ref 5.0–8.0)

## 2011-06-17 ENCOUNTER — Ambulatory Visit: Payer: 59

## 2011-06-18 ENCOUNTER — Ambulatory Visit
Admission: RE | Admit: 2011-06-18 | Discharge: 2011-06-18 | Disposition: A | Discharge status: Home / Self Care | Payer: 59 | Source: Ambulatory Visit | Attending: Gynecology | Admitting: Gynecology

## 2011-06-18 DIAGNOSIS — Z1231 Encounter for screening mammogram for malignant neoplasm of breast: Secondary | ICD-10-CM

## 2011-07-21 ENCOUNTER — Other Ambulatory Visit: Payer: Self-pay | Admitting: Gynecology

## 2011-08-01 ENCOUNTER — Emergency Department (INDEPENDENT_AMBULATORY_CARE_PROVIDER_SITE_OTHER): Payer: 59

## 2011-08-01 ENCOUNTER — Encounter (HOSPITAL_COMMUNITY): Payer: Self-pay | Admitting: Emergency Medicine

## 2011-08-01 ENCOUNTER — Emergency Department (HOSPITAL_COMMUNITY)
Admission: EM | Admit: 2011-08-01 | Discharge: 2011-08-01 | Disposition: A | Discharge status: Home / Self Care | Payer: 59 | Source: Home / Self Care | Attending: Emergency Medicine | Admitting: Emergency Medicine

## 2011-08-01 DIAGNOSIS — S7000XA Contusion of unspecified hip, initial encounter: Secondary | ICD-10-CM

## 2011-08-01 DIAGNOSIS — S7002XA Contusion of left hip, initial encounter: Secondary | ICD-10-CM

## 2011-08-01 MED ORDER — HYDROCODONE-ACETAMINOPHEN 5-325 MG PO TABS: ORAL_TABLET | ORAL | Status: AC

## 2011-08-01 NOTE — ED Notes (Signed)
Pt was sitting in a dinning room chair when her husband stumbled into her, knocking her onto the floor onto her left hip. Began having pain during the night. States pain is with walking only.

## 2011-08-01 NOTE — ED Provider Notes (Signed)
Chief Complaint  Patient presents with  . Hip Pain    History of Present Illness:   Tanya [name] is a 61 year old female who injured her left hip last night. She was sitting in the chair and her husband tripped, bumped into the chair, not the chair over, and she ended up falling sideways on her left side. She did not hit her head and there was no loss of consciousness. She did thereafter have some pain in her left hip in the groin area. She's able to walk but with a limp. She denies any radiation the pain, numbness, or tingling.  Review of Systems:  Other than noted above, the patient denies any of the following symptoms: Systemic:  No fevers, chills, sweats, or aches.  No fatigue or tiredness. Musculoskeletal:  No joint pain, arthritis, bursitis, swelling, back pain, or neck pain. Neurological:  No muscular weakness, paresthesias, headache, or trouble with speech or coordination.  No dizziness.   PMFSH:  Past medical history, family history, social history, meds, and allergies were reviewed.  Physical Exam:   Vital signs:  BP 148/80  Pulse 89  Temp 99.4 F (37.4 C) (Oral)  Resp 18  SpO2 96% Gen:  Alert and oriented times 3.  In no distress. Musculoskeletal: There was slight pain to palpation in the left groin. No swelling, bruising or deformity. The hip itself has a full range of motion with some pain on rotation. She has full flexion. Otherwise, all joints had a full a ROM with no swelling, bruising or deformity.  No edema, pulses full. Extremities were warm and pink.  Capillary refill was brisk.  Skin:  Clear, warm and dry.  No rash. Neuro:  Alert and oriented times 3.  Muscle strength was normal.  Sensation was intact to light touch.   Radiology:  Dg Hip Complete Left  08/01/2011  *RADIOLOGY REPORT*  Clinical Data: Pain post fall  LEFT HIP - COMPLETE 2+ VIEW  Comparison: None.  Findings: Three views of the left hip submitted.  No acute fracture or subluxation.  Degenerative changes are  noted left hip joint with spurring of the left femoral head.  Minimal remodeling of the left femoral head.  Subtle sclerotic probable degenerative changes femoral head.  Mild narrowing of superior joint space.  IMPRESSION: No acute fracture or subluxation.  Degenerative changes as described above.  Original Report Authenticated By: Natasha Mead, M.D.    Assessment:  The encounter diagnosis was Contusion of left hip.  Plan:   1.  The following meds were prescribed:   New Prescriptions   HYDROCODONE-ACETAMINOPHEN (NORCO/VICODIN) 5-325 MG PER TABLET    1 to 2 tabs every 4 to 6 hours as needed for pain.   2.  The patient was instructed in symptomatic care, including rest and activity, elevation, application of ice and compression.  Appropriate handouts were given. 3.  The patient was told to return if becoming worse in any way, if no better in 3 or 4 days, and given some red flag symptoms that would indicate earlier return.   4.  The patient was told to follow up with her primary care physician if pain continues.   Reuben Likes, MD 08/01/11 [phone]

## 2011-08-25 ENCOUNTER — Telehealth: Payer: Self-pay | Admitting: *Deleted

## 2011-08-25 NOTE — Telephone Encounter (Signed)
Pt informed with the below note, she will call back in November to have chest x-ray done.

## 2011-08-25 NOTE — Telephone Encounter (Signed)
Pt called asking if you want a chest x-ray this year due to her smoking? Last office visit 06/09/11 annual

## 2011-08-25 NOTE — Telephone Encounter (Signed)
Yes, her last chest x-ray was in November of 2010 so this November would be 3 years. Please schedule for one after November.

## 2011-10-05 ENCOUNTER — Telehealth: Payer: Self-pay | Admitting: *Deleted

## 2011-10-05 NOTE — Telephone Encounter (Signed)
Error

## 2011-10-27 ENCOUNTER — Telehealth: Payer: Self-pay | Admitting: *Deleted

## 2011-10-27 DIAGNOSIS — M8580 Other specified disorders of bone density and structure, unspecified site: Secondary | ICD-10-CM

## 2011-10-27 NOTE — Telephone Encounter (Signed)
Message copied by Aura Camps on Wed Oct 27, 2011  9:01 AM ------      Message from: Carole Civil R      Created: Tue Oct 26, 2011  4:41 PM      Contact: [phone]       Tanya [name] pt spoke with you earlier about scheduling a bone density. She goes to the [facility] on [address].      Pt states when calling to make her appt for a bone density she was told we need to send order to them first. The phone number for them is [phone]. Can you call her at her work number once you do that? She came in here personally to make sure we would take care of it(??). Thanks, Toniann Fail

## 2011-10-27 NOTE — Telephone Encounter (Signed)
Order placed, pt informed to call and schedule.

## 2011-11-03 ENCOUNTER — Ambulatory Visit
Admission: RE | Admit: 2011-11-03 | Discharge: 2011-11-03 | Disposition: A | Discharge status: Home / Self Care | Payer: 59 | Source: Ambulatory Visit | Attending: Gynecology | Admitting: Gynecology

## 2011-11-03 DIAGNOSIS — M8580 Other specified disorders of bone density and structure, unspecified site: Secondary | ICD-10-CM

## 2011-11-08 ENCOUNTER — Telehealth: Payer: Self-pay | Admitting: *Deleted

## 2011-11-08 DIAGNOSIS — F17200 Nicotine dependence, unspecified, uncomplicated: Secondary | ICD-10-CM

## 2011-11-08 NOTE — Telephone Encounter (Signed)
Pt called requesting order for chest x-ray per telephone encounter 08/25/11 order placed.

## 2011-11-17 ENCOUNTER — Ambulatory Visit (HOSPITAL_COMMUNITY)
Admission: RE | Admit: 2011-11-17 | Discharge: 2011-11-17 | Disposition: A | Discharge status: Home / Self Care | Payer: 59 | Source: Ambulatory Visit | Attending: Gynecology | Admitting: Gynecology

## 2011-11-17 DIAGNOSIS — Z0000 Encounter for general adult medical examination without abnormal findings: Secondary | ICD-10-CM | POA: Insufficient documentation

## 2011-11-17 DIAGNOSIS — F17200 Nicotine dependence, unspecified, uncomplicated: Secondary | ICD-10-CM | POA: Insufficient documentation

## 2011-11-17 DIAGNOSIS — J449 Chronic obstructive pulmonary disease, unspecified: Secondary | ICD-10-CM | POA: Insufficient documentation

## 2011-11-17 DIAGNOSIS — J4489 Other specified chronic obstructive pulmonary disease: Secondary | ICD-10-CM | POA: Insufficient documentation

## 2011-11-22 ENCOUNTER — Telehealth: Payer: Self-pay | Admitting: *Deleted

## 2011-11-22 NOTE — Telephone Encounter (Signed)
Pt calling requesting recent dexa and chest x-ray results. Please advise

## 2011-11-22 NOTE — Telephone Encounter (Signed)
Please inform patient that her chest x-ray is stable with evidence of chronic obstructive pulmonary disease (smokers lungs). She needs to be reminded once again that she needs to stop smoking. Her bone density study in stable. She has osteopenia which is decreased bone mineralization. She's to continue to take her calcium and vitamin D as well as regular exercise 3-4 times a week and stop smoking. She should continue her Evista 60 mg daily

## 2011-11-23 NOTE — Telephone Encounter (Signed)
Pt informed with the below note. 

## 2012-02-25 ENCOUNTER — Telehealth: Payer: Self-pay | Admitting: *Deleted

## 2012-02-25 DIAGNOSIS — Z0000 Encounter for general adult medical examination without abnormal findings: Secondary | ICD-10-CM

## 2012-02-25 NOTE — Telephone Encounter (Signed)
Message copied by Deatra James on Fri Feb 25, 2012  9:14 AM ------      Message from: Burnett Harry      Created: Thu Feb 24, 2012  3:37 PM      Regarding: CPE 03/17/12       tHANKS ------

## 2012-02-25 NOTE — Telephone Encounter (Signed)
Received staff msg pt made cpx for March. Entering cpx labs...Tanya [name]

## 2012-03-13 ENCOUNTER — Ambulatory Visit (INDEPENDENT_AMBULATORY_CARE_PROVIDER_SITE_OTHER): Payer: 59

## 2012-03-13 DIAGNOSIS — Z0000 Encounter for general adult medical examination without abnormal findings: Secondary | ICD-10-CM

## 2012-03-13 LAB — URINALYSIS, ROUTINE W REFLEX MICROSCOPIC
Bilirubin Urine: NEGATIVE
Ketones, ur: 15
Leukocytes, UA: NEGATIVE
Nitrite: NEGATIVE
Specific Gravity, Urine: 1.025 (ref 1.000–1.030)
Total Protein, Urine: NEGATIVE
pH: 6 (ref 5.0–8.0)

## 2012-03-13 LAB — BASIC METABOLIC PANEL
Chloride: 103 mEq/L (ref 96–112)
GFR: 90.7 mL/min (ref >60.00)
Potassium: 4.3 mEq/L (ref 3.5–5.1)
Sodium: 140 mEq/L (ref 135–145)

## 2012-03-13 LAB — CBC WITH DIFFERENTIAL/PLATELET
Basophils Relative: 0.7 % (ref 0.0–3.0)
Eosinophils Relative: 1.5 % (ref 0.0–5.0)
HCT: 42.1 % (ref 36.0–46.0)
Lymphs Abs: 1.9 10*3/uL (ref 0.7–4.0)
MCV: 98.9 fl (ref 78.0–100.0)
Monocytes Absolute: 0.4 10*3/uL (ref 0.1–1.0)
Monocytes Relative: 6.8 % (ref 3.0–12.0)
RBC: 4.25 Mil/uL (ref 3.87–5.11)
WBC: 5.8 10*3/uL (ref 4.5–10.5)

## 2012-03-13 LAB — HEPATIC FUNCTION PANEL
Alkaline Phosphatase: 35 U/L — ABNORMAL LOW (ref 39–117)
Bilirubin, Direct: 0.1 mg/dL (ref 0.0–0.3)
Total Bilirubin: 0.6 mg/dL (ref 0.3–1.2)
Total Protein: 7.4 g/dL (ref 6.0–8.3)

## 2012-03-13 LAB — LIPID PANEL
HDL: 90.9 mg/dL (ref >39.00)
Total CHOL/HDL Ratio: 2
VLDL: 11.2 mg/dL (ref 0.0–40.0)

## 2012-03-13 LAB — TSH: TSH: 2.03 u[IU]/mL (ref 0.35–5.50)

## 2012-03-17 ENCOUNTER — Ambulatory Visit (INDEPENDENT_AMBULATORY_CARE_PROVIDER_SITE_OTHER): Payer: 59 | Admitting: Internal Medicine

## 2012-03-17 ENCOUNTER — Encounter: Payer: Self-pay | Admitting: Internal Medicine

## 2012-03-17 VITALS — BP 118/74 | HR 88 | Temp 98.1°F | Ht 65.5 in | Wt 117.1 lb

## 2012-03-17 DIAGNOSIS — M949 Disorder of cartilage, unspecified: Secondary | ICD-10-CM

## 2012-03-17 DIAGNOSIS — Z2911 Encounter for prophylactic immunotherapy for respiratory syncytial virus (RSV): Secondary | ICD-10-CM

## 2012-03-17 DIAGNOSIS — Z23 Encounter for immunization: Secondary | ICD-10-CM

## 2012-03-17 DIAGNOSIS — R3129 Other microscopic hematuria: Secondary | ICD-10-CM

## 2012-03-17 DIAGNOSIS — F17200 Nicotine dependence, unspecified, uncomplicated: Secondary | ICD-10-CM

## 2012-03-17 DIAGNOSIS — Z0000 Encounter for general adult medical examination without abnormal findings: Secondary | ICD-10-CM

## 2012-03-17 DIAGNOSIS — J449 Chronic obstructive pulmonary disease, unspecified: Secondary | ICD-10-CM

## 2012-03-17 NOTE — Assessment & Plan Note (Signed)
Continue Evista and WB activity Advised on smoking cessation

## 2012-03-17 NOTE — Assessment & Plan Note (Signed)
asymptomatic - continued tobacco abuse reviewed and cessation advised 10/2011 CXR with hyperinflation changes reviewed

## 2012-03-17 NOTE — Patient Instructions (Signed)
It was good to see you today. We have reviewed your prior records including labs and tests today Medications reviewed, no changes at this time. Health Maintenance reviewed - all recommended immunizations and age-appropriate screenings are up-to-date. Zostavax (shingles) updated today Repeat urine testing on Wednesday to follow up on "microscopic" trace of blood as discussed - your results will be called to you after review of same Continue to think about giving up cigarettes! Use nicotine gum, nicotine patches or electronic cigarettes. If you're interested in medication to help you quit, please call  - let me know how I can help! Please schedule followup in 12 months for annual medical physical and labs, call sooner if problems.  Health Maintenance, Females A healthy lifestyle and preventative care can promote health and wellness.  Maintain regular health, dental, and eye exams.  Eat a healthy diet. Foods like vegetables, fruits, whole grains, low-fat dairy products, and lean protein foods contain the nutrients you need without too many calories. Decrease your intake of foods high in solid fats, added sugars, and salt. Get information about a proper diet from your caregiver, if necessary.  Regular physical exercise is one of the most important things you can do for your health. Most adults should get at least 150 minutes of moderate-intensity exercise (any activity that increases your heart rate and causes you to sweat) each week. In addition, most adults need muscle-strengthening exercises on 2 or more days a week.   Maintain a healthy weight. The body mass index (BMI) is a screening tool to identify possible weight problems. It provides an estimate of body fat based on height and weight. Your caregiver can help determine your BMI, and can help you achieve or maintain a healthy weight. For adults 20 years and older:  A BMI below 18.5 is considered underweight.  A BMI of 18.5 to 24.9 is  normal.  A BMI of 25 to 29.9 is considered overweight.  A BMI of 30 and above is considered obese.  Maintain normal blood lipids and cholesterol by exercising and minimizing your intake of saturated fat. Eat a balanced diet with plenty of fruits and vegetables. Blood tests for lipids and cholesterol should begin at age 47 and be repeated every 5 years. If your lipid or cholesterol levels are high, you are over 50, or you are a high risk for heart disease, you may need your cholesterol levels checked more frequently.Ongoing high lipid and cholesterol levels should be treated with medicines if diet and exercise are not effective.  If you smoke, find out from your caregiver how to quit. If you do not use tobacco, do not start.  If you are pregnant, do not drink alcohol. If you are breastfeeding, be very cautious about drinking alcohol. If you are not pregnant and choose to drink alcohol, do not exceed 1 drink per day. One drink is considered to be 12 ounces (355 mL) of beer, 5 ounces (148 mL) of wine, or 1.5 ounces (44 mL) of liquor.  Avoid use of street drugs. Do not share needles with anyone. Ask for help if you need support or instructions about stopping the use of drugs.  High blood pressure causes heart disease and increases the risk of stroke. Blood pressure should be checked at least every 1 to 2 years. Ongoing high blood pressure should be treated with medicines, if weight loss and exercise are not effective.  If you are 50 to 60 years old, ask your caregiver if you should take aspirin  to prevent strokes.  Diabetes screening involves taking a blood sample to check your fasting blood sugar level. This should be done once every 3 years, after age 35, if you are within normal weight and without risk factors for diabetes. Testing should be considered at a younger age or be carried out more frequently if you are overweight and have at least 1 risk factor for diabetes.  Breast cancer screening is  essential preventative care for women. You should practice "breast self-awareness." This means understanding the normal appearance and feel of your breasts and may include breast self-examination. Any changes detected, no matter how small, should be reported to a caregiver. Women in their 38s and 30s should have a clinical breast exam (CBE) by a caregiver as part of a regular health exam every 1 to 3 years. After age 35, women should have a CBE every year. Starting at age 60, women should consider having a mammogram (breast X-ray) every year. Women who have a family history of breast cancer should talk to their caregiver about genetic screening. Women at a high risk of breast cancer should talk to their caregiver about having an MRI and a mammogram every year.  The Pap test is a screening test for cervical cancer. Women should have a Pap test starting at age 4. Between ages 46 and 50, Pap tests should be repeated every 2 years. Beginning at age 33, you should have a Pap test every 3 years as long as the past 3 Pap tests have been normal. If you had a hysterectomy for a problem that was not cancer or a condition that could lead to cancer, then you no longer need Pap tests. If you are between ages 28 and 50, and you have had normal Pap tests going back 10 years, you no longer need Pap tests. If you have had past treatment for cervical cancer or a condition that could lead to cancer, you need Pap tests and screening for cancer for at least 20 years after your treatment. If Pap tests have been discontinued, risk factors (such as a new sexual partner) need to be reassessed to determine if screening should be resumed. Some women have medical problems that increase the chance of getting cervical cancer. In these cases, your caregiver may recommend more frequent screening and Pap tests.  The human papillomavirus (HPV) test is an additional test that may be used for cervical cancer screening. The HPV test looks for the  virus that can cause the cell changes on the cervix. The cells collected during the Pap test can be tested for HPV. The HPV test could be used to screen women aged 60 years and older, and should be used in women of any age who have unclear Pap test results. After the age of 73, women should have HPV testing at the same frequency as a Pap test.  Colorectal cancer can be detected and often prevented. Most routine colorectal cancer screening begins at the age of 63 and continues through age 59. However, your caregiver may recommend screening at an earlier age if you have risk factors for colon cancer. On a yearly basis, your caregiver may provide home test kits to check for hidden blood in the stool. Use of a small camera at the end of a tube, to directly examine the colon (sigmoidoscopy or colonoscopy), can detect the earliest forms of colorectal cancer. Talk to your caregiver about this at age 25, when routine screening begins. Direct examination of the colon should  be repeated every 5 to 10 years through age 11, unless early forms of pre-cancerous polyps or small growths are found.  Hepatitis C blood testing is recommended for all people born from [birth year] through 1963 and any individual with known risks for hepatitis C.  Practice safe sex. Use condoms and avoid high-risk sexual practices to reduce the spread of sexually transmitted infections (STIs). Sexually active women aged 51 and younger should be checked for Chlamydia, which is a common sexually transmitted infection. Older women with new or multiple partners should also be tested for Chlamydia. Testing for other STIs is recommended if you are sexually active and at increased risk.  Osteoporosis is a disease in which the bones lose minerals and strength with aging. This can result in serious bone fractures. The risk of osteoporosis can be identified using a bone density scan. Women ages 14 and over and women at risk for fractures or osteoporosis should  discuss screening with their caregivers. Ask your caregiver whether you should be taking a calcium supplement or vitamin D to reduce the rate of osteoporosis.  Menopause can be associated with physical symptoms and risks. Hormone replacement therapy is available to decrease symptoms and risks. You should talk to your caregiver about whether hormone replacement therapy is right for you.  Use sunscreen with a sun protection factor (SPF) of 30 or greater. Apply sunscreen liberally and repeatedly throughout the day. You should seek shade when your shadow is shorter than you. Protect yourself by wearing long sleeves, pants, a wide-brimmed hat, and sunglasses year round, whenever you are outdoors.  Notify your caregiver of new moles or changes in moles, especially if there is a change in shape or color. Also notify your caregiver if a mole is larger than the size of a pencil eraser.  Stay current with your immunizations. Document Released: 07/06/2010 Document Revised: 03/15/2011 Document Reviewed: 07/06/2010 [facility] Patient Information 2013 [place], [place]. Smoking Cessation Quitting smoking is important to your health and has many advantages. However, it is not always easy to quit since nicotine is a very addictive drug. Often times, people try 3 times or more before being able to quit. This document explains the best ways for you to prepare to quit smoking. Quitting takes hard work and a lot of effort, but you can do it. ADVANTAGES OF QUITTING SMOKING  You will live longer, feel better, and live better.  Your body will feel the impact of quitting smoking almost immediately.  Within 20 minutes, blood pressure decreases. Your pulse returns to its normal level.  After 8 hours, carbon monoxide levels in the blood return to normal. Your oxygen level increases.  After 24 hours, the chance of having a heart attack starts to decrease. Your breath, hair, and body stop smelling like smoke.  After 48  hours, damaged nerve endings begin to recover. Your sense of taste and smell improve.  After 72 hours, the body is virtually free of nicotine. Your bronchial tubes relax and breathing becomes easier.  After 2 to 12 weeks, lungs can hold more air. Exercise becomes easier and circulation improves.  The risk of having a heart attack, stroke, cancer, or lung disease is greatly reduced.  After 1 year, the risk of coronary heart disease is cut in half.  After 5 years, the risk of stroke falls to the same as a nonsmoker.  After 10 years, the risk of lung cancer is cut in half and the risk of other cancers decreases significantly.  After 15  years, the risk of coronary heart disease drops, usually to the level of a nonsmoker.  If you are pregnant, quitting smoking will improve your chances of having a healthy baby.  The people you live with, especially any children, will be healthier.  You will have extra money to spend on things other than cigarettes. QUESTIONS TO THINK ABOUT BEFORE ATTEMPTING TO QUIT You may want to talk about your answers with your caregiver.  Why do you want to quit?  If you tried to quit in the past, what helped and what did not?  What will be the most difficult situations for you after you quit? How will you plan to handle them?  Who can help you through the tough times? Your family? Friends? A caregiver?  What pleasures do you get from smoking? What ways can you still get pleasure if you quit? Here are some questions to ask your caregiver:  How can you help me to be successful at quitting?  What medicine do you think would be best for me and how should I take it?  What should I do if I need more help?  What is smoking withdrawal like? How can I get information on withdrawal? GET READY  Set a quit date.  Change your environment by getting rid of all cigarettes, ashtrays, matches, and lighters in your home, car, or work. Do not let people smoke in your  home.  Review your past attempts to quit. Think about what worked and what did not. GET SUPPORT AND ENCOURAGEMENT You have a better chance of being successful if you have help. You can get support in many ways.  Tell your family, friends, and co-workers that you are going to quit and need their support. Ask them not to smoke around you.  Get individual, group, or telephone counseling and support. Programs are available at [employer] and health centers. Call your local health department for information about programs in your area.  Spiritual beliefs and practices may help some smokers quit.  Download a "quit meter" on your computer to keep track of quit statistics, such as how long you have gone without smoking, cigarettes not smoked, and money saved.  Get a self-help book about quitting smoking and staying off of tobacco. LEARN NEW SKILLS AND BEHAVIORS  Distract yourself from urges to smoke. Talk to someone, go for a walk, or occupy your time with a task.  Change your normal routine. Take a different route to work. Drink tea instead of coffee. Eat breakfast in a different place.  Reduce your stress. Take a hot bath, exercise, or read a book.  Plan something enjoyable to do every day. Reward yourself for not smoking.  Explore interactive web-based programs that specialize in helping you quit. GET MEDICINE AND USE IT CORRECTLY Medicines can help you stop smoking and decrease the urge to smoke. Combining medicine with the above behavioral methods and support can greatly increase your chances of successfully quitting smoking.  Nicotine replacement therapy helps deliver nicotine to your body without the negative effects and risks of smoking. Nicotine replacement therapy includes nicotine gum, lozenges, inhalers, nasal sprays, and skin patches. Some may be available over-the-counter and others require a prescription.  Antidepressant medicine helps people abstain from smoking, but how  this works is unknown. This medicine is available by prescription.  Nicotinic receptor partial agonist medicine simulates the effect of nicotine in your brain. This medicine is available by prescription. Ask your caregiver for advice about which medicines to  use and how to use them based on your health history. Your caregiver will tell you what side effects to look out for if you choose to be on a medicine or therapy. Carefully read the information on the package. Do not use any other product containing nicotine while using a nicotine replacement product.  RELAPSE OR DIFFICULT SITUATIONS Most relapses occur within the first 3 months after quitting. Do not be discouraged if you start smoking again. Remember, most people try several times before finally quitting. You may have symptoms of withdrawal because your body is used to nicotine. You may crave cigarettes, be irritable, feel very hungry, cough often, get headaches, or have difficulty concentrating. The withdrawal symptoms are only temporary. They are strongest when you first quit, but they will go away within 10 14 days. To reduce the chances of relapse, try to:  Avoid drinking alcohol. Drinking lowers your chances of successfully quitting.  Reduce the amount of caffeine you consume. Once you quit smoking, the amount of caffeine in your body increases and can give you symptoms, such as a rapid heartbeat, sweating, and anxiety.  Avoid smokers because they can make you want to smoke.  Do not let weight gain distract you. Many smokers will gain weight when they quit, usually less than 10 pounds. Eat a healthy diet and stay active. You can always lose the weight gained after you quit.  Find ways to improve your mood other than smoking. FOR MORE INFORMATION  www.smokefree.gov  Document Released: 12/15/2000 Document Revised: 06/22/2011 Document Reviewed: 04/01/2011 [facility] Patient Information 2013 [place], [place].

## 2012-03-17 NOTE — Progress Notes (Signed)
Subjective:    Patient ID: Tanya [name], female    DOB: 1950/12/27, 62 y.o.   MRN: [id number]  HPI  patient is here today for annual physical. Patient feels well and has no complaints.  Also reviewed chronic medical issues and interval history  Past Medical History  Diagnosis Date  . COPD, mild   . Smoker   . Osteopenia     DEXA 10/27/11: -1.4, no change from 10/2009  . Shingles   . Breast cancer    Family History  Problem Relation Age of Onset  . Heart disease Mother   . Hypertension Mother   . Breast cancer Mother 25  . Cancer Mother     UTERINE AND SKIN  . Heart disease Paternal Grandfather   . Hypertension Father    History  Substance Use Topics  . Smoking status: Current Every Day Smoker -- 1.00 packs/day    Types: Cigarettes  . Smokeless tobacco: Never Used  . Alcohol Use: Yes     Comment: 5 DRINKS A WEEK    Review of Systems Constitutional: Negative for fever or weight change.  Respiratory: Negative for cough and shortness of breath.   Cardiovascular: Negative for chest pain or palpitations.  Gastrointestinal: Negative for abdominal pain, no bowel changes.  Musculoskeletal: Negative for gait problem or joint swelling.  Skin: Negative for rash.  Neurological: Negative for dizziness or headache.  No other specific complaints in a complete review of systems (except as listed in HPI above).     Objective:   Physical Exam BP 118/74  Pulse 88  Temp(Src) 98.1 F (36.7 C) (Oral)  Ht 5' 5.5" (1.664 m)  Wt 117 lb 1.9 oz (53.125 kg)  BMI 19.19 kg/m2  SpO2 99% Wt Readings from Last 3 Encounters:  03/17/12 117 lb 1.9 oz (53.125 kg)  06/09/11 118 lb (53.524 kg)  02/23/10 120 lb 4 oz (54.545 kg)   Constitutional: She is thin, smells of tobacco - but well-developed and well-nourished. No distress.  HENT: Head: Normocephalic and atraumatic. Ears: B TMs ok, no erythema or effusion; Nose: Nose normal. Mouth/Throat: Oropharynx is clear and moist. No  oropharyngeal exudate. small soft polyps (2) of upper mouth at junction of palates Eyes: Conjunctivae and EOM are normal. Pupils are equal, round, and reactive to light. No scleral icterus.  Neck: Normal range of motion. Neck supple. No JVD present. No thyromegaly present.  Cardiovascular: Normal rate, regular rhythm and normal heart sounds.  No murmur heard. No BLE edema. Pulmonary/Chest: Effort normal and breath sounds normal. No respiratory distress. She has no wheezes.  Abdominal: Soft. Bowel sounds are normal. She exhibits no distension. There is no tenderness. no masses. No bruit or abnormal pulsations. Musculoskeletal: Normal range of motion, no joint effusions. No gross deformities Neurological: She is alert and oriented to person, place, and time. No cranial nerve deficit. Coordination normal.  Skin: Skin is warm and dry. No rash noted. No erythema.  Psychiatric: She has a normal mood and affect. Her behavior is normal. Judgment and thought content normal.   Lab Results  Component Value Date   WBC 5.8 03/13/2012   HGB 14.4 03/13/2012   HCT 42.1 03/13/2012   PLT 209.0 03/13/2012   GLUCOSE 86 03/13/2012   CHOL 223* 03/13/2012   TRIG 56.0 03/13/2012   HDL 90.90 03/13/2012   LDLDIRECT 127.4 03/13/2012   LDLCALC 125* 06/09/2011   ALT 15 03/13/2012   AST 23 03/13/2012   NA 140 03/13/2012   K 4.3  03/13/2012   CL 103 03/13/2012   CREATININE 0.7 03/13/2012   BUN 22 03/13/2012   CO2 28 03/13/2012   TSH 2.03 03/13/2012   NWG:NFAOZ @ 85 bpm - R axis with RVH, occ PACs     Assessment & Plan:   CPX/v70.0 - Patient has been counseled on age-appropriate routine health concerns for screening and prevention. These are reviewed and up-to-date. Immunizations are up-to-date or declined. Labs and ECG reviewed.  microscopic hematuria - no symptoms UTI - given tobacco abise, recheck UA next week with Ucx and refer to uro for eval if persistent rbcs without UTI - explained same to pt who understands and  agrees  Tobacco abuse - .5 minutes today spent counseling patient on unhealthy effects of continued tobacco abuse and encouragement of cessation including medical options available to help the patient quit smoking.

## 2012-03-22 ENCOUNTER — Other Ambulatory Visit (INDEPENDENT_AMBULATORY_CARE_PROVIDER_SITE_OTHER): Payer: 59

## 2012-03-22 DIAGNOSIS — R3129 Other microscopic hematuria: Secondary | ICD-10-CM

## 2012-03-22 DIAGNOSIS — F17200 Nicotine dependence, unspecified, uncomplicated: Secondary | ICD-10-CM

## 2012-03-22 LAB — URINALYSIS, ROUTINE W REFLEX MICROSCOPIC
Hgb urine dipstick: NEGATIVE
Leukocytes, UA: NEGATIVE
Nitrite: NEGATIVE
Specific Gravity, Urine: 1.01 (ref 1.000–1.030)
Urobilinogen, UA: 0.2 (ref 0.0–1.0)

## 2012-03-24 LAB — URINE CULTURE: Colony Count: 6000

## 2012-04-07 ENCOUNTER — Other Ambulatory Visit: Payer: Self-pay

## 2012-04-07 ENCOUNTER — Telehealth: Payer: Self-pay

## 2012-04-07 ENCOUNTER — Other Ambulatory Visit: Payer: Self-pay | Admitting: Gynecology

## 2012-04-07 DIAGNOSIS — Z853 Personal history of malignant neoplasm of breast: Secondary | ICD-10-CM

## 2012-04-07 DIAGNOSIS — Z1231 Encounter for screening mammogram for malignant neoplasm of breast: Secondary | ICD-10-CM

## 2012-04-07 NOTE — Telephone Encounter (Signed)
Patient said you usually have her have a pelvic u/s at her [facility].  She wanted to see if you wanted her to do that again this year before she schedules CE.

## 2012-04-07 NOTE — Telephone Encounter (Signed)
Yes because of her history of breast cancer.

## 2012-04-10 NOTE — Telephone Encounter (Signed)
Tanya [name] contacted her and scheduled appt.

## 2012-04-26 ENCOUNTER — Encounter: Payer: Self-pay | Admitting: Gynecology

## 2012-04-26 ENCOUNTER — Ambulatory Visit (INDEPENDENT_AMBULATORY_CARE_PROVIDER_SITE_OTHER): Payer: 59

## 2012-04-26 ENCOUNTER — Ambulatory Visit (INDEPENDENT_AMBULATORY_CARE_PROVIDER_SITE_OTHER): Payer: 59 | Admitting: Gynecology

## 2012-04-26 ENCOUNTER — Other Ambulatory Visit (HOSPITAL_COMMUNITY)
Admission: RE | Admit: 2012-04-26 | Discharge: 2012-04-26 | Disposition: A | Discharge status: Home / Self Care | Payer: 59 | Source: Ambulatory Visit | Attending: Gynecology | Admitting: Gynecology

## 2012-04-26 VITALS — BP 122/74 | Ht 65.0 in | Wt 122.0 lb

## 2012-04-26 DIAGNOSIS — N83339 Acquired atrophy of ovary and fallopian tube, unspecified side: Secondary | ICD-10-CM

## 2012-04-26 DIAGNOSIS — Z853 Personal history of malignant neoplasm of breast: Secondary | ICD-10-CM

## 2012-04-26 DIAGNOSIS — F17200 Nicotine dependence, unspecified, uncomplicated: Secondary | ICD-10-CM

## 2012-04-26 DIAGNOSIS — Z01419 Encounter for gynecological examination (general) (routine) without abnormal findings: Secondary | ICD-10-CM | POA: Insufficient documentation

## 2012-04-26 DIAGNOSIS — Z1151 Encounter for screening for human papillomavirus (HPV): Secondary | ICD-10-CM | POA: Insufficient documentation

## 2012-04-26 DIAGNOSIS — N8300 Follicular cyst of ovary, unspecified side: Secondary | ICD-10-CM

## 2012-04-26 NOTE — Patient Instructions (Addendum)
Smoking Cessation Quitting smoking is important to your health and has many advantages. However, it is not always easy to quit since nicotine is a very addictive drug. Often times, people try 3 times or more before being able to quit. This document explains the best ways for you to prepare to quit smoking. Quitting takes hard work and a lot of effort, but you can do it. ADVANTAGES OF QUITTING SMOKING  You will live longer, feel better, and live better.  Your body will feel the impact of quitting smoking almost immediately.  Within 20 minutes, blood pressure decreases. Your pulse returns to its normal level.  After 8 hours, carbon monoxide levels in the blood return to normal. Your oxygen level increases.  After 24 hours, the chance of having a heart attack starts to decrease. Your breath, hair, and body stop smelling like smoke.  After 48 hours, damaged nerve endings begin to recover. Your sense of taste and smell improve.  After 72 hours, the body is virtually free of nicotine. Your bronchial tubes relax and breathing becomes easier.  After 2 to 12 weeks, lungs can hold more air. Exercise becomes easier and circulation improves.  The risk of having a heart attack, stroke, cancer, or lung disease is greatly reduced.  After 1 year, the risk of coronary heart disease is cut in half.  After 5 years, the risk of stroke falls to the same as a nonsmoker.  After 10 years, the risk of lung cancer is cut in half and the risk of other cancers decreases significantly.  After 15 years, the risk of coronary heart disease drops, usually to the level of a nonsmoker.  If you are pregnant, quitting smoking will improve your chances of having a healthy baby.  The people you live with, especially any children, will be healthier.  You will have extra money to spend on things other than cigarettes. QUESTIONS TO THINK ABOUT BEFORE ATTEMPTING TO QUIT You may want to talk about your answers with your  caregiver.  Why do you want to quit?  If you tried to quit in the past, what helped and what did not?  What will be the most difficult situations for you after you quit? How will you plan to handle them?  Who can help you through the tough times? Your family? Friends? A caregiver?  What pleasures do you get from smoking? What ways can you still get pleasure if you quit? Here are some questions to ask your caregiver:  How can you help me to be successful at quitting?  What medicine do you think would be best for me and how should I take it?  What should I do if I need more help?  What is smoking withdrawal like? How can I get information on withdrawal? GET READY  Set a quit date.  Change your environment by getting rid of all cigarettes, ashtrays, matches, and lighters in your home, car, or work. Do not let people smoke in your home.  Review your past attempts to quit. Think about what worked and what did not. GET SUPPORT AND ENCOURAGEMENT You have a better chance of being successful if you have help. You can get support in many ways.  Tell your family, friends, and co-workers that you are going to quit and need their support. Ask them not to smoke around you.  Get individual, group, or telephone counseling and support. Programs are available at local hospitals and health centers. Call your local health department for   information about programs in your area.  Spiritual beliefs and practices may help some smokers quit.  Download a "quit meter" on your computer to keep track of quit statistics, such as how long you have gone without smoking, cigarettes not smoked, and money saved.  Get a self-help book about quitting smoking and staying off of tobacco. LEARN NEW SKILLS AND BEHAVIORS  Distract yourself from urges to smoke. Talk to someone, go for a walk, or occupy your time with a task.  Change your normal routine. Take a different route to work. Drink tea instead of coffee.  Eat breakfast in a different place.  Reduce your stress. Take a hot bath, exercise, or read a book.  Plan something enjoyable to do every day. Reward yourself for not smoking.  Explore interactive web-based programs that specialize in helping you quit. GET MEDICINE AND USE IT CORRECTLY Medicines can help you stop smoking and decrease the urge to smoke. Combining medicine with the above behavioral methods and support can greatly increase your chances of successfully quitting smoking.  Nicotine replacement therapy helps deliver nicotine to your body without the negative effects and risks of smoking. Nicotine replacement therapy includes nicotine gum, lozenges, inhalers, nasal sprays, and skin patches. Some may be available over-the-counter and others require a prescription.  Antidepressant medicine helps people abstain from smoking, but how this works is unknown. This medicine is available by prescription.  Nicotinic receptor partial agonist medicine simulates the effect of nicotine in your brain. This medicine is available by prescription. Ask your caregiver for advice about which medicines to use and how to use them based on your health history. Your caregiver will tell you what side effects to look out for if you choose to be on a medicine or therapy. Carefully read the information on the package. Do not use any other product containing nicotine while using a nicotine replacement product.  RELAPSE OR DIFFICULT SITUATIONS Most relapses occur within the first 3 months after quitting. Do not be discouraged if you start smoking again. Remember, most people try several times before finally quitting. You may have symptoms of withdrawal because your body is used to nicotine. You may crave cigarettes, be irritable, feel very hungry, cough often, get headaches, or have difficulty concentrating. The withdrawal symptoms are only temporary. They are strongest when you first quit, but they will go away within  10 14 days. To reduce the chances of relapse, try to:  Avoid drinking alcohol. Drinking lowers your chances of successfully quitting.  Reduce the amount of caffeine you consume. Once you quit smoking, the amount of caffeine in your body increases and can give you symptoms, such as a rapid heartbeat, sweating, and anxiety.  Avoid smokers because they can make you want to smoke.  Do not let weight gain distract you. Many smokers will gain weight when they quit, usually less than 10 pounds. Eat a healthy diet and stay active. You can always lose the weight gained after you quit.  Find ways to improve your mood other than smoking. FOR MORE INFORMATION  www.smokefree.gov  Document Released: 12/15/2000 Document Revised: 06/22/2011 Document Reviewed: 04/01/2011 [employer] Patient Information 2013 [employer].  

## 2012-04-26 NOTE — Progress Notes (Signed)
Tanya [name] 11-16-1950 [id number]   History:    62 y.o.  for annual gyn exam With no complaints today. Her primary physician Dr. Azalia Bilis has done her lab work. 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 Patient's treatment consisted of a left lumpectomy along with radiation and 5 years of tamoxifen. Patient currently on Evista 60 mg daily for osteopenia. Patient is a chronic smoker of 20 years. Patient had a normal chest x-ray in 2011.patient last mammogram was normal in 2013. Her breasts are dense and for this reason is recommended she have a 3-D with her next mammogram. Patient's shingles and Tdap vaccinations are up-to-date. Patient's last colonoscopy was normal in 2009. Her bone density study in 2013 demonstrated osteopenia. She is currently on Evista 60 mg daily. Patient would know prior history of abnormal Pap smears.  Patient is now 15 years cancer free from her breast cancer  Past medical history,surgical history, family history and social history were all reviewed and documented in the EPIC chart.  Gynecologic History No LMP recorded. Patient is postmenopausal. Contraception: post menopausal status Last Pap: 2013. Results were: normal Last mammogram: 2013. Results were: normal  Obstetric History OB History   Grav Para Term Preterm Abortions TAB SAB Ect Mult Living   3 0   2  1   0     # Outc Date GA Lbr Len/2nd Wgt Sex Del Anes PTL Lv   1 GRA            2 ABT            3 SAB                ROS: A ROS was performed and pertinent positives and negatives are included in the history.  GENERAL: No fevers or chills. HEENT: No change in vision, no earache, sore throat or sinus congestion. NECK: No pain or stiffness. CARDIOVASCULAR: No chest pain or pressure. No palpitations. PULMONARY: No shortness of breath, cough or wheeze. GASTROINTESTINAL: No abdominal pain, nausea, vomiting or diarrhea, melena or bright red  blood per rectum. GENITOURINARY: No urinary frequency, urgency, hesitancy or dysuria. MUSCULOSKELETAL: No joint or muscle pain, no back pain, no recent trauma. DERMATOLOGIC: No rash, no itching, no lesions. ENDOCRINE: No polyuria, polydipsia, no heat or cold intolerance. No recent change in weight. HEMATOLOGICAL: No anemia or easy bruising or bleeding. NEUROLOGIC: No headache, seizures, numbness, tingling or weakness. PSYCHIATRIC: No depression, no loss of interest in normal activity or change in sleep pattern.     Exam: chaperone present  BP 122/74  Ht 5\' 5"  (1.651 m)  Wt 122 lb (55.339 kg)  BMI 20.3 kg/m2  Body mass index is 20.3 kg/(m^2).  General appearance : Well developed well nourished female. No acute distress HEENT: Neck supple, trachea midline, no carotid bruits, no thyroidmegaly Lungs: Clear to auscultation, no rhonchi or wheezes, or rib retractions  Heart: Regular rate and rhythm, no murmurs or gallops Breast:Examined in sitting and supine position were symmetrical in appearance, no palpable masses or tenderness,  no skin retraction, no nipple inversion, no nipple discharge, no skin discoloration, no axillary or supraclavicular lymphadenopathy Abdomen: no palpable masses or tenderness, no rebound or guarding Extremities: no edema or skin discoloration or tenderness  Pelvic:  Bartholin, Urethra, Skene Glands: Within normal limits             Vagina: No gross lesions or discharge, atrophic changes  Cervix: No  gross lesions or discharge  Uterus  anteverted, normal size, shape and consistency, non-tender and mobile  Adnexa  Without masses or tenderness  Anus and perineum  normal   Rectovaginal  normal sphincter tone without palpated masses or tenderness             Hemoccult card provided     Assessment/Plan:  62 y.o. 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 Patient's  treatment consisted of a left lumpectomy along with radiation and 5 years of tamoxifen.). Patient was provided with Hemoccult card system it to the office for testing. Pap smear was done today. New guidelines discussed. Patient was scheduled bone density study for next year. She was reminded to do her monthly self breast examination. In June when she goes upper mammogram I have recommended that she requests a 3-D since she's had a history of breast cancer and currently has dense breasts. Once again she was counseled as to the detrimental effects of smoking and literature formation was provided.     Ok Edwards MD, 1:53 PM 04/26/2012

## 2012-05-08 ENCOUNTER — Encounter: Payer: Self-pay | Admitting: Gynecology

## 2012-06-05 ENCOUNTER — Ambulatory Visit
Admission: RE | Admit: 2012-06-05 | Discharge: 2012-06-05 | Disposition: A | Discharge status: Home / Self Care | Payer: 59 | Source: Ambulatory Visit

## 2012-06-05 DIAGNOSIS — Z1231 Encounter for screening mammogram for malignant neoplasm of breast: Secondary | ICD-10-CM

## 2012-09-19 ENCOUNTER — Other Ambulatory Visit: Payer: Self-pay | Admitting: Gynecology

## 2012-10-10 ENCOUNTER — Ambulatory Visit (INDEPENDENT_AMBULATORY_CARE_PROVIDER_SITE_OTHER): Payer: 59

## 2012-10-10 DIAGNOSIS — Z23 Encounter for immunization: Secondary | ICD-10-CM

## 2012-11-24 ENCOUNTER — Telehealth: Payer: Self-pay | Admitting: *Deleted

## 2012-11-24 NOTE — Telephone Encounter (Signed)
Know she does not eat a chest x-ray this year

## 2012-11-24 NOTE — Telephone Encounter (Signed)
Pt is smoker had chest - ray done last in Nov. 2013, pt asked if you would like pt to repeat this year as well? Please advise

## 2012-11-24 NOTE — Telephone Encounter (Signed)
The below noted stating pt does not need a chest x-ray this year. I informed patient with this information.

## 2013-05-17 ENCOUNTER — Other Ambulatory Visit: Payer: Self-pay

## 2013-05-17 DIAGNOSIS — Z1231 Encounter for screening mammogram for malignant neoplasm of breast: Secondary | ICD-10-CM

## 2013-06-08 ENCOUNTER — Encounter: Payer: Self-pay | Admitting: Gynecology

## 2013-06-08 ENCOUNTER — Ambulatory Visit (INDEPENDENT_AMBULATORY_CARE_PROVIDER_SITE_OTHER): Payer: 59 | Admitting: Gynecology

## 2013-06-08 VITALS — BP 130/84 | Ht 64.25 in | Wt 119.0 lb

## 2013-06-08 DIAGNOSIS — M949 Disorder of cartilage, unspecified: Secondary | ICD-10-CM

## 2013-06-08 DIAGNOSIS — Z853 Personal history of malignant neoplasm of breast: Secondary | ICD-10-CM

## 2013-06-08 DIAGNOSIS — Z1159 Encounter for screening for other viral diseases: Secondary | ICD-10-CM

## 2013-06-08 DIAGNOSIS — M8580 Other specified disorders of bone density and structure, unspecified site: Secondary | ICD-10-CM

## 2013-06-08 DIAGNOSIS — F17200 Nicotine dependence, unspecified, uncomplicated: Secondary | ICD-10-CM

## 2013-06-08 DIAGNOSIS — Z01419 Encounter for gynecological examination (general) (routine) without abnormal findings: Secondary | ICD-10-CM

## 2013-06-08 DIAGNOSIS — M899 Disorder of bone, unspecified: Secondary | ICD-10-CM

## 2013-06-08 LAB — LIPID PANEL
Cholesterol: 211 mg/dL — ABNORMAL HIGH (ref 0–200)
HDL: 70 mg/dL (ref >39)
LDL Cholesterol: 126 mg/dL — ABNORMAL HIGH (ref 0–99)
Total CHOL/HDL Ratio: 3 Ratio
Triglycerides: 76 mg/dL (ref <150)
VLDL: 15 mg/dL (ref 0–40)

## 2013-06-08 LAB — CBC WITH DIFFERENTIAL/PLATELET
BASOS ABS: 0 10*3/uL (ref 0.0–0.1)
Basophils Relative: 0 % (ref 0–1)
Eosinophils Absolute: 0.1 10*3/uL (ref 0.0–0.7)
Eosinophils Relative: 2 % (ref 0–5)
HEMATOCRIT: 45.4 % (ref 36.0–46.0)
HEMOGLOBIN: 15.9 g/dL — AB (ref 12.0–15.0)
LYMPHS PCT: 32 % (ref 12–46)
Lymphs Abs: 1.6 10*3/uL (ref 0.7–4.0)
MCH: 34.1 pg — ABNORMAL HIGH (ref 26.0–34.0)
MCHC: 35 g/dL (ref 30.0–36.0)
MCV: 97.4 fL (ref 78.0–100.0)
MONO ABS: 0.4 10*3/uL (ref 0.1–1.0)
MONOS PCT: 8 % (ref 3–12)
NEUTROS ABS: 2.9 10*3/uL (ref 1.7–7.7)
Neutrophils Relative %: 58 % (ref 43–77)
Platelets: 183 10*3/uL (ref 150–400)
RBC: 4.66 MIL/uL (ref 3.87–5.11)
RDW: 13.5 % (ref 11.5–15.5)
WBC: 5 10*3/uL (ref 4.0–10.5)

## 2013-06-08 LAB — COMPREHENSIVE METABOLIC PANEL
ALBUMIN: 4.4 g/dL (ref 3.5–5.2)
ALT: 11 U/L (ref 0–35)
AST: 19 U/L (ref 0–37)
Alkaline Phosphatase: 36 U/L — ABNORMAL LOW (ref 39–117)
BUN: 15 mg/dL (ref 6–23)
CALCIUM: 9.5 mg/dL (ref 8.4–10.5)
CHLORIDE: 99 meq/L (ref 96–112)
CO2: 29 mEq/L (ref 19–32)
Creat: 0.66 mg/dL (ref 0.50–1.10)
GLUCOSE: 86 mg/dL (ref 70–99)
POTASSIUM: 4.3 meq/L (ref 3.5–5.3)
SODIUM: 139 meq/L (ref 135–145)
TOTAL PROTEIN: 6.9 g/dL (ref 6.0–8.3)
Total Bilirubin: 0.6 mg/dL (ref 0.2–1.2)

## 2013-06-08 LAB — TSH: TSH: 1.331 u[IU]/mL (ref 0.350–4.500)

## 2013-06-08 NOTE — Progress Notes (Signed)
Tanya [name] 10-24-1950 [id number]   History:    63 y.o.  for annual gyn exam with no complaints today.Her primary physician Dr. 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 Patient's treatment consisted of a left lumpectomy along with radiation and 5 years of tamoxifen. Patient currently on Evista 60 mg daily for osteopenia. Patient is a chronic smoker of 20 years. Patient had a normal chest x-ray in 2011.patient last mammogram in 2014 and was normal. She has dense breast and for this reason we are also recommending three-dimensional mammogram.Patient's shingles and Tdap vaccinations are up-to-date. Patient's last colonoscopy was normal in 2009. Her bone density study in 2013 demonstrated osteopenia. Patient with no past history of abnormal Pap smear. Patient taking her calcium and vitamin D twice a day.  Bone density study 2013 demonstrated that the lowest T score was -1.4 at the left femoral neck with no statistical change when compared with 2011.   Past medical history,surgical history, family history and social history were all reviewed and documented in the EPIC chart.  Gynecologic History No LMP recorded. Patient is postmenopausal. Contraception: post menopausal status Last Pap: 2014. Results were: normal Last mammogram: 2014. Results were: Normal but dense  Obstetric History OB History  Gravida Para Term Preterm AB SAB TAB Ectopic Multiple Living  3 0   2 1    0    # Outcome Date GA Lbr Len/2nd Weight Sex Delivery Anes PTL Lv  3 SAB           2 ABT           1 GRA                ROS: A ROS was performed and pertinent positives and negatives are included in the history.  GENERAL: No fevers or chills. HEENT: No change in vision, no earache, sore throat or sinus congestion. NECK: No pain or stiffness.  CARDIOVASCULAR: No chest pain or pressure. No palpitations. PULMONARY: No shortness of breath, cough or wheeze. GASTROINTESTINAL: No abdominal pain, nausea, vomiting or diarrhea, melena or bright red blood per rectum. GENITOURINARY: No urinary frequency, urgency, hesitancy or dysuria. MUSCULOSKELETAL: No joint or muscle pain, no back pain, no recent trauma. DERMATOLOGIC: 3 black mass with keratosis noted ENDOCRINE: No polyuria, polydipsia, no heat or cold intolerance. No recent change in weight. HEMATOLOGICAL: No anemia or easy bruising or bleeding. NEUROLOGIC: No headache, seizures, numbness, tingling or weakness. PSYCHIATRIC: No depression, no loss of interest in normal activity or change in sleep pattern.     Exam: chaperone present  BP 130/84  Ht 5' 4.25" (1.632 m)  Wt 119 lb (53.978 kg)  BMI 20.27 kg/m2  Body mass index is 20.27 kg/(m^2).  General appearance : Well developed well nourished female. No acute distress HEENT: Neck supple, trachea midline, no carotid bruits, no thyroidmegaly Lungs: Clear to auscultation, no rhonchi or wheezes, or rib retractions  Heart: Regular rate and rhythm, no murmurs or gallops Breast:Examined in sitting and supine position were symmetrical in appearance, no palpable masses or tenderness,  no skin retraction, no nipple inversion, no nipple discharge, no skin discoloration, no axillary or supraclavicular lymphadenopathy Abdomen: no palpable masses or tenderness, no rebound or guarding Extremities: no edema or skin discoloration or tenderness  Pelvic:  Bartholin, Urethra, Skene Glands: Within  normal limits             Vagina: No gross lesions or discharge, vaginal atrophy  Cervix: No gross lesions or discharge  Uterus  anteverted, normal size, shape and consistency, non-tender and mobile  Adnexa  Without masses or tenderness  Anus and perineum  normal   Rectovaginal  normal sphincter tone without palpated masses or tenderness             Hemoccult  cards provided     Assessment/Plan:  63 y.o. female for annual exam will have the following labs today: CBC, fasting lipid profile, comprehensive metabolic panel, TSH, and urinalysis. Pap smear not done today in accordance to the new guidelines.  #1. Patient will make appointment with dermatologist for now check #2 due to patient's history of chronic smoking one pack per day and history of COPD changes on previous x-rays she will have a chest x-ray PA and lateral this week. She was counseled on the detrimental effects of smoking once again. #3 patient will schedule her mammogram for next week and it's recommended that it be a three-dimensional mammogram because of her dense breasts and history of breast cancer #4 patient will need a bone density study at the end of this year. We discussed importance of calcium and vitamin D and regular exercise for osteoporosis prevention.  Note: This dictation was prepared with  Dragon/digital dictation along withSmart phrase technology. Any transcriptional errors that result from this process are unintentional.   [name] MD, 10:13 AM 06/08/2013

## 2013-06-08 NOTE — Patient Instructions (Signed)
Smoking Cessation Quitting smoking is important to your health and has many advantages. However, it is not always easy to quit since nicotine is a very addictive drug. Often times, people try 3 times or more before being able to quit. This document explains the best ways for you to prepare to quit smoking. Quitting takes hard work and a lot of effort, but you can do it. ADVANTAGES OF QUITTING SMOKING  You will live longer, feel better, and live better.  Your body will feel the impact of quitting smoking almost immediately.  Within 20 minutes, blood pressure decreases. Your pulse returns to its normal level.  After 8 hours, carbon monoxide levels in the blood return to normal. Your oxygen level increases.  After 24 hours, the chance of having a heart attack starts to decrease. Your breath, hair, and body stop smelling like smoke.  After 48 hours, damaged nerve endings begin to recover. Your sense of taste and smell improve.  After 72 hours, the body is virtually free of nicotine. Your bronchial tubes relax and breathing becomes easier.  After 2 to 12 weeks, lungs can hold more air. Exercise becomes easier and circulation improves.  The risk of having a heart attack, stroke, cancer, or lung disease is greatly reduced.  After 1 year, the risk of coronary heart disease is cut in half.  After 5 years, the risk of stroke falls to the same as a nonsmoker.  After 10 years, the risk of lung cancer is cut in half and the risk of other cancers decreases significantly.  After 15 years, the risk of coronary heart disease drops, usually to the level of a nonsmoker.  If you are pregnant, quitting smoking will improve your chances of having a healthy baby.  The people you live with, especially any children, will be healthier.  You will have extra money to spend on things other than cigarettes. QUESTIONS TO THINK ABOUT BEFORE ATTEMPTING TO QUIT You may want to talk about your answers with your  caregiver.  Why do you want to quit?  If you tried to quit in the past, what helped and what did not?  What will be the most difficult situations for you after you quit? How will you plan to handle them?  Who can help you through the tough times? Your family? Friends? A caregiver?  What pleasures do you get from smoking? What ways can you still get pleasure if you quit? Here are some questions to ask your caregiver:  How can you help me to be successful at quitting?  What medicine do you think would be best for me and how should I take it?  What should I do if I need more help?  What is smoking withdrawal like? How can I get information on withdrawal? GET READY  Set a quit date.  Change your environment by getting rid of all cigarettes, ashtrays, matches, and lighters in your home, car, or work. Do not let people smoke in your home.  Review your past attempts to quit. Think about what worked and what did not. GET SUPPORT AND ENCOURAGEMENT You have a better chance of being successful if you have help. You can get support in many ways.  Tell your family, friends, and co-workers that you are going to quit and need their support. Ask them not to smoke around you.  Get individual, group, or telephone counseling and support. Programs are available at local hospitals and health centers. Call your local health department for   information about programs in your area.  Spiritual beliefs and practices may help some smokers quit.  Download a "quit meter" on your computer to keep track of quit statistics, such as how long you have gone without smoking, cigarettes not smoked, and money saved.  Get a self-help book about quitting smoking and staying off of tobacco. LEARN NEW SKILLS AND BEHAVIORS  Distract yourself from urges to smoke. Talk to someone, go for a walk, or occupy your time with a task.  Change your normal routine. Take a different route to work. Drink tea instead of coffee.  Eat breakfast in a different place.  Reduce your stress. Take a hot bath, exercise, or read a book.  Plan something enjoyable to do every day. Reward yourself for not smoking.  Explore interactive web-based programs that specialize in helping you quit. GET MEDICINE AND USE IT CORRECTLY Medicines can help you stop smoking and decrease the urge to smoke. Combining medicine with the above behavioral methods and support can greatly increase your chances of successfully quitting smoking.  Nicotine replacement therapy helps deliver nicotine to your body without the negative effects and risks of smoking. Nicotine replacement therapy includes nicotine gum, lozenges, inhalers, nasal sprays, and skin patches. Some may be available over-the-counter and others require a prescription.  Antidepressant medicine helps people abstain from smoking, but how this works is unknown. This medicine is available by prescription.  Nicotinic receptor partial agonist medicine simulates the effect of nicotine in your brain. This medicine is available by prescription. Ask your caregiver for advice about which medicines to use and how to use them based on your health history. Your caregiver will tell you what side effects to look out for if you choose to be on a medicine or therapy. Carefully read the information on the package. Do not use any other product containing nicotine while using a nicotine replacement product.  RELAPSE OR DIFFICULT SITUATIONS Most relapses occur within the first 3 months after quitting. Do not be discouraged if you start smoking again. Remember, most people try several times before finally quitting. You may have symptoms of withdrawal because your body is used to nicotine. You may crave cigarettes, be irritable, feel very hungry, cough often, get headaches, or have difficulty concentrating. The withdrawal symptoms are only temporary. They are strongest when you first quit, but they will go away within  10 14 days. To reduce the chances of relapse, try to:  Avoid drinking alcohol. Drinking lowers your chances of successfully quitting.  Reduce the amount of caffeine you consume. Once you quit smoking, the amount of caffeine in your body increases and can give you symptoms, such as a rapid heartbeat, sweating, and anxiety.  Avoid smokers because they can make you want to smoke.  Do not let weight gain distract you. Many smokers will gain weight when they quit, usually less than 10 pounds. Eat a healthy diet and stay active. You can always lose the weight gained after you quit.  Find ways to improve your mood other than smoking. FOR MORE INFORMATION  www.smokefree.gov  Document Released: 12/15/2000 Document Revised: 06/22/2011 Document Reviewed: 04/01/2011 [employer] Patient Information 2014 [employer].  

## 2013-06-09 LAB — HEPATITIS C ANTIBODY: HCV Ab: NEGATIVE

## 2013-06-09 LAB — URINALYSIS W MICROSCOPIC + REFLEX CULTURE
BILIRUBIN URINE: NEGATIVE
Bacteria, UA: NONE SEEN
CRYSTALS: NONE SEEN
Casts: NONE SEEN
Glucose, UA: NEGATIVE mg/dL
Hgb urine dipstick: NEGATIVE
Ketones, ur: NEGATIVE mg/dL
LEUKOCYTES UA: NEGATIVE
NITRITE: NEGATIVE
Protein, ur: NEGATIVE mg/dL
SPECIFIC GRAVITY, URINE: 1.015 (ref 1.005–1.030)
SQUAMOUS EPITHELIAL / LPF: NONE SEEN
UROBILINOGEN UA: 0.2 mg/dL (ref 0.0–1.0)
pH: 6.5 (ref 5.0–8.0)

## 2013-06-12 ENCOUNTER — Ambulatory Visit
Admission: RE | Admit: 2013-06-12 | Discharge: 2013-06-12 | Disposition: A | Discharge status: Home / Self Care | Payer: 59 | Source: Ambulatory Visit

## 2013-06-12 DIAGNOSIS — Z1231 Encounter for screening mammogram for malignant neoplasm of breast: Secondary | ICD-10-CM

## 2013-06-22 ENCOUNTER — Encounter: Payer: Self-pay | Admitting: Gynecology

## 2013-06-22 ENCOUNTER — Ambulatory Visit (INDEPENDENT_AMBULATORY_CARE_PROVIDER_SITE_OTHER): Payer: 59 | Admitting: Gynecology

## 2013-06-22 ENCOUNTER — Ambulatory Visit (INDEPENDENT_AMBULATORY_CARE_PROVIDER_SITE_OTHER): Payer: 59

## 2013-06-22 DIAGNOSIS — Z853 Personal history of malignant neoplasm of breast: Secondary | ICD-10-CM

## 2013-06-22 DIAGNOSIS — N83201 Unspecified ovarian cyst, right side: Secondary | ICD-10-CM | POA: Insufficient documentation

## 2013-06-22 DIAGNOSIS — N83209 Unspecified ovarian cyst, unspecified side: Secondary | ICD-10-CM

## 2013-06-22 DIAGNOSIS — E785 Hyperlipidemia, unspecified: Secondary | ICD-10-CM

## 2013-06-22 DIAGNOSIS — D251 Intramural leiomyoma of uterus: Secondary | ICD-10-CM

## 2013-06-22 NOTE — Patient Instructions (Signed)
Patient information: High cholesterol (The Basics)  What is cholesterol? - Cholesterol is a substance that is found in the blood. Everyone has some. It is needed for good health. The problem is, people sometimes have too much cholesterol. Compared with people with normal cholesterol, people with high cholesterol have a higher risk of heart attacks, strokes, and other health problems. The higher your cholesterol, the higher your risk of these problems. Cholesterol levels in your body are determined significantly by your diet. Cholesterol levels may also be related to heart disease. The following material helps to explain this relationship and discusses what you can do to help keep your heart healthy. Not all cholesterol is bad. Low-density lipoprotein (LDL) cholesterol is the "bad" cholesterol. It may cause fatty deposits to build up inside your arteries. High-density lipoprotein (HDL) cholesterol is "good." It helps to remove the "bad" LDL cholesterol from your blood. Cholesterol is a very important risk factor for heart disease. Other risk factors are high blood pressure, smoking, stress, heredity, and weight.  The heart muscle gets its supply of blood through the coronary arteries. If your LDL cholesterol is high and your HDL cholesterol is low, you are at risk for having fatty deposits build up in your coronary arteries. This leaves less room through which blood can flow. Without sufficient blood and oxygen, the heart muscle cannot function properly and you may feel chest pains (angina pectoris). When a coronary artery closes up entirely, a part of the heart muscle may die, causing a heart attack (myocardial infarction).  CHECKING CHOLESTEROL When your caregiver sends your blood to a lab to be analyzed for cholesterol, a complete lipid (fat) profile may be done. With this test, the total amount of cholesterol and levels of LDL and HDL are determined. Triglycerides  are a type of fat that circulates in the blood and can also be used to determine heart disease risk. Are there different types of cholesterol? - Yes, there are a few different types. If you get a cholesterol test, you may hear your doctor or nurse talk about: Total cholesterol  LDL cholesterol - Some people call this the "bad" cholesterol. That's because having high LDL levels raises your risk of heart attacks, strokes, and other health problems.  HDL cholesterol - Some people call this the "good" cholesterol. That's because having high HDL levels lowers your risk of heart attacks, strokes, and other health problems.  Non-HDL cholesterol - Non-HDL cholesterol is your total cholesterol minus your HDL cholesterol.  Triglycerides - Triglycerides are not cholesterol. They are a type of fat. But they often get measured when cholesterol is measured. (Having high triglycerides also seems to increase the risk of heart attacks and strokes.)   Keep in mind, though, that many people who cannot meet these goals still have a low risk of heart attacks and strokes. What should I do if my doctor tells me I have high cholesterol? - Ask your doctor what your overall risk of heart attacks and strokes is. High cholesterol, by itself, is not always a reason to worry. Having high cholesterol is just one of many things that can increase your risk of heart attacks and strokes. Other factors that increase your risk include:  Cigarette smoking  High blood pressure  Having a parent, sister, or brother who got heart disease at a young age (Young, in this case, means younger than 55 for men and younger than 65 for women.)  Being a man (Women are at risk, too, but men   have a higher risk.)  Older age  If you are at high risk of heart attacks and strokes, having high cholesterol is a problem. On the other hand, if you have are at low risk, having high cholesterol may not mean much. Should I take medicine to lower cholesterol? - Not  everyone who has high cholesterol needs medicines. Your doctor or nurse will decide if you need them based on your age, family history, and other health concerns.  You should probably take a cholesterol-lowering medicine called a statin if you: Already had a heart attack or stroke  Have known heart disease  Have diabetes  Have a condition called peripheral artery disease, which makes it painful to walk, and happens when the arteries in your legs get clogged with fatty deposits  Have an abdominal aortic aneurysm, which is a widening of the main artery in the belly  Most people with any of the conditions listed above should take a statin no matter what their cholesterol level is. If your doctor or nurse puts you on a statin, stay on it. The medicine may not make you feel any different. But it can help prevent heart attacks, strokes, and death.  Can I lower my cholesterol without medicines? - Yes, you can lower your cholesterol some by:  Avoiding red meat, butter, fried foods, cheese, and other foods that have a lot of saturated fat  Losing weight (if you are overweight)  Being more active Even if these steps do little to change your cholesterol, they can improve your health in many ways.                                                   Cholesterol Control Diet  CONTROLLING CHOLESTEROL WITH DIET Although exercise and lifestyle factors are important, your diet is key. That is because certain foods are known to raise cholesterol and others to lower it. The goal is to balance foods for their effect on cholesterol and more importantly, to replace saturated and trans fat with other types of fat, such as monounsaturated fat, polyunsaturated fat, and omega-3 fatty acids. On average, a person should consume no more than 15 to 17 g of saturated fat daily. Saturated and trans fats are considered "bad" fats, and they will raise LDL cholesterol. Saturated fats are primarily found in animal products such as  meats, butter, and cream. However, that does not mean you need to sacrifice all your favorite foods. Today, there are good tasting, low-fat, low-cholesterol substitutes for most of the things you like to eat. Choose low-fat or nonfat alternatives. Choose round or loin cuts of red meat, since these types of cuts are lowest in fat and cholesterol. Chicken (without the skin), fish, veal, and ground turkey breast are excellent choices. Eliminate fatty meats, such as hot dogs and salami. Even shellfish have little or no saturated fat. Have a 3 oz (85 g) portion when you eat lean meat, poultry, or fish. Trans fats are also called "partially hydrogenated oils." They are oils that have been scientifically manipulated so that they are solid at room temperature resulting in a longer shelf life and improved taste and texture of foods in which they are added. Trans fats are found in stick margarine, some tub margarines, cookies, crackers, and baked goods.  When baking and cooking, oils are an excellent substitute   for butter. The monounsaturated oils are especially beneficial since it is believed they lower LDL and raise HDL. The oils you should avoid entirely are saturated tropical oils, such as coconut and palm.  Remember to eat liberally from food groups that are naturally free of saturated and trans fat, including fish, fruit, vegetables, beans, grains (barley, rice, couscous, bulgur wheat), and pasta (without cream sauces).  IDENTIFYING FOODS THAT LOWER CHOLESTEROL  Soluble fiber may lower your cholesterol. This type of fiber is found in fruits such as apples, vegetables such as broccoli, potatoes, and carrots, legumes such as beans, peas, and lentils, and grains such as barley. Foods fortified with plant sterols (phytosterol) may also lower cholesterol. You should eat at least 2 g per day of these foods for a cholesterol lowering effect.  Read package labels to identify low-saturated fats, trans fats free, and  low-fat foods at the supermarket. Select cheeses that have only 2 to 3 g saturated fat per ounce. Use a heart-healthy tub margarine that is free of trans fats or partially hydrogenated oil. When buying baked goods (cookies, crackers), avoid partially hydrogenated oils. Breads and muffins should be made from whole grains (whole-wheat or whole oat flour, instead of "flour" or "enriched flour"). Buy non-creamy canned soups with reduced salt and no added fats.  FOOD PREPARATION TECHNIQUES  Never deep-fry. If you must fry, either stir-fry, which uses very little fat, or use non-stick cooking sprays. When possible, broil, bake, or roast meats, and steam vegetables. Instead of dressing vegetables with butter or margarine, use lemon and herbs, applesauce and cinnamon (for squash and sweet potatoes), nonfat yogurt, salsa, and low-fat dressings for salads.  LOW-SATURATED FAT / LOW-FAT FOOD SUBSTITUTES Meats / Saturated Fat (g)  Avoid: Steak, marbled (3 oz/85 g) / 11 g   Choose: Steak, lean (3 oz/85 g) / 4 g   Avoid: Hamburger (3 oz/85 g) / 7 g   Choose: Hamburger, lean (3 oz/85 g) / 5 g   Avoid: Ham (3 oz/85 g) / 6 g   Choose: Ham, lean cut (3 oz/85 g) / 2.4 g   Avoid: Chicken, with skin, dark meat (3 oz/85 g) / 4 g   Choose: Chicken, skin removed, dark meat (3 oz/85 g) / 2 g   Avoid: Chicken, with skin, light meat (3 oz/85 g) / 2.5 g   Choose: Chicken, skin removed, light meat (3 oz/85 g) / 1 g  Dairy / Saturated Fat (g)  Avoid: Whole milk (1 cup) / 5 g   Choose: Low-fat milk, 2% (1 cup) / 3 g   Choose: Low-fat milk, 1% (1 cup) / 1.5 g   Choose: Skim milk (1 cup) / 0.3 g   Avoid: Hard cheese (1 oz/28 g) / 6 g   Choose: Skim milk cheese (1 oz/28 g) / 2 to 3 g   Avoid: Cottage cheese, 4% fat (1 cup) / 6.5 g   Choose: Low-fat cottage cheese, 1% fat (1 cup) / 1.5 g   Avoid: Ice cream (1 cup) / 9 g   Choose: Sherbet (1 cup) / 2.5 g   Choose: Nonfat frozen yogurt (1 cup) / 0.3 g    Choose: Frozen fruit bar / trace   Avoid: Whipped cream (1 tbs) / 3.5 g   Choose: Nondairy whipped topping (1 tbs) / 1 g  Condiments / Saturated Fat (g)  Avoid: Mayonnaise (1 tbs) / 2 g   Choose: Low-fat mayonnaise (1 tbs) / 1 g   Avoid:   Butter (1 tbs) / 7 g   Choose: Extra light margarine (1 tbs) / 1 g   Avoid: Coconut oil (1 tbs) / 11.8 g   Choose: Olive oil (1 tbs) / 1.8 g   Choose: Corn oil (1 tbs) / 1.7 g   Choose: Safflower oil (1 tbs) / 1.2 g   Choose: Sunflower oil (1 tbs) / 1.4 g   Choose: Soybean oil (1 tbs) / 2.4 g   Choose: Canola oil (1 tbs) / 1 g  Smoking Cessation Quitting smoking is important to your health and has many advantages. However, it is not always easy to quit since nicotine is a very addictive drug. Often times, people try 3 times or more before being able to quit. This document explains the best ways for you to prepare to quit smoking. Quitting takes hard work and a lot of effort, but you can do it. ADVANTAGES OF QUITTING SMOKING You will live longer, feel better, and live better. Your body will feel the impact of quitting smoking almost immediately. Within 20 minutes, blood pressure decreases. Your pulse returns to its normal level. After 8 hours, carbon monoxide levels in the blood return to normal. Your oxygen level increases. After 24 hours, the chance of having a heart attack starts to decrease. Your breath, hair, and body stop smelling like smoke. After 48 hours, damaged nerve endings begin to recover. Your sense of taste and smell improve. After 72 hours, the body is virtually free of nicotine. Your bronchial tubes relax and breathing becomes easier. After 2 to 12 weeks, lungs can hold more air. Exercise becomes easier and circulation improves. The risk of having a heart attack, stroke, cancer, or lung disease is greatly reduced. After 1 year, the risk of coronary heart disease is cut in half. After 5 years, the risk of stroke falls to  the same as a nonsmoker. After 10 years, the risk of lung cancer is cut in half and the risk of other cancers decreases significantly. After 15 years, the risk of coronary heart disease drops, usually to the level of a nonsmoker. If you are pregnant, quitting smoking will improve your chances of having a healthy baby. The people you live with, especially any children, will be healthier. You will have extra money to spend on things other than cigarettes. QUESTIONS TO THINK ABOUT BEFORE ATTEMPTING TO QUIT You may want to talk about your answers with your caregiver. Why do you want to quit? If you tried to quit in the past, what helped and what did not? What will be the most difficult situations for you after you quit? How will you plan to handle them? Who can help you through the tough times? Your family? Friends? A caregiver? What pleasures do you get from smoking? What ways can you still get pleasure if you quit? Here are some questions to ask your caregiver: How can you help me to be successful at quitting? What medicine do you think would be best for me and how should I take it? What should I do if I need more help? What is smoking withdrawal like? How can I get information on withdrawal? GET READY Set a quit date. Change your environment by getting rid of all cigarettes, ashtrays, matches, and lighters in your home, car, or work. Do not let people smoke in your home. Review your past attempts to quit. Think about what worked and what did not. GET SUPPORT AND ENCOURAGEMENT You have a better chance of being  successful if you have help. You can get support in many ways. Tell your family, friends, and co-workers that you are going to quit and need their support. Ask them not to smoke around you. Get individual, group, or telephone counseling and support. Programs are available at [employer] and health centers. Call your local health department for information about programs in your  area. Spiritual beliefs and practices may help some smokers quit. Download a "quit meter" on your computer to keep track of quit statistics, such as how long you have gone without smoking, cigarettes not smoked, and money saved. Get a self-help book about quitting smoking and staying off of tobacco. [place] yourself from urges to smoke. Talk to someone, go for a walk, or occupy your time with a task. Change your normal routine. Take a different route to work. Drink tea instead of coffee. Eat breakfast in a different place. Reduce your stress. Take a hot bath, exercise, or read a book. Plan something enjoyable to do every day. Reward yourself for not smoking. Explore interactive web-based programs that specialize in helping you quit. GET MEDICINE AND USE IT CORRECTLY Medicines can help you stop smoking and decrease the urge to smoke. Combining medicine with the above behavioral methods and support can greatly increase your chances of successfully quitting smoking. Nicotine replacement therapy helps deliver nicotine to your body without the negative effects and risks of smoking. Nicotine replacement therapy includes nicotine gum, lozenges, inhalers, nasal sprays, and skin patches. Some may be available over-the-counter and others require a prescription. Antidepressant medicine helps people abstain from smoking, but how this works is unknown. This medicine is available by prescription. Nicotinic receptor partial agonist medicine simulates the effect of nicotine in your brain. This medicine is available by prescription. Ask your caregiver for advice about which medicines to use and how to use them based on your health history. Your caregiver will tell you what side effects to look out for if you choose to be on a medicine or therapy. Carefully read the information on the package. Do not use any other product containing nicotine while using a nicotine replacement product.   RELAPSE OR DIFFICULT SITUATIONS Most relapses occur within the first 3 months after quitting. Do not be discouraged if you start smoking again. Remember, most people try several times before finally quitting. You may have symptoms of withdrawal because your body is used to nicotine. You may crave cigarettes, be irritable, feel very hungry, cough often, get headaches, or have difficulty concentrating. The withdrawal symptoms are only temporary. They are strongest when you first quit, but they will go away within 10-14 days. To reduce the chances of relapse, try to: Avoid drinking alcohol. Drinking lowers your chances of successfully quitting. Reduce the amount of caffeine you consume. Once you quit smoking, the amount of caffeine in your body increases and can give you symptoms, such as a rapid heartbeat, sweating, and anxiety. Avoid smokers because they can make you want to smoke. Do not let weight gain distract you. Many smokers will gain weight when they quit, usually less than 10 pounds. Eat a healthy diet and stay active. You can always lose the weight gained after you quit. Find ways to improve your mood other than smoking. FOR MORE INFORMATION  www.smokefree.gov  Document Released: 12/15/2000 Document Revised: 06/22/2011 Document Reviewed: 04/01/2011 [facility] Patient Information 2015 [place], [place]. This information is not intended to replace advice given to you by your health care provider. Make  sure you discuss any questions you have with your health care provider.  

## 2013-06-22 NOTE — Progress Notes (Signed)
   Patient presented to the office in today to discuss her annual screen pelvic ultrasound. Patient was seen in the office on June 5 for her annual exam. Patient's primary physician Dr. Ripley Fraise who she has not seen in over a year so we did her fasting lab work last visit. Her fasting lipid profile once again and demonstrated her LDL to be elevated at 126. Patient once again is adamant about trying to be placed on statin. She has other family members who have hyperlipidemia. She once again states that she is going to try a  cholesterol-lowering diet that she had read about I have told her that this may be genetic and will need medical treatment. I am also concerned over 20+ year smoking history where she smokes one pack cigarettes per day. She is a high  risk for stroke. She will touch base with her PCP to perhaps consider sending her to a cardiologist for stress test and carotid Doppler studies as well.   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 Patient's treatment consisted of a left lumpectomy along with radiation and 5 years of tamoxifen. Patient currently on Evista 60 mg daily for osteopenia.   Ultrasound today: Uterus measured 5.5 x 4.0 x 2.6 cm with endometrial stripe of 2.7 mm. Patient with 2 small intramural fibroids largest one measuring 16 x 8 mm. Left ear was normal. Right ovarian pedicle free thin wall a vascular cyst/follicle measuring 9 x 8 mm. No free fluid in the cul-de-sac.  The above findings were discussed with the patient. No worrisome feature. Followup ultrasound in one year. Once again the patient was encouraged to stop smoking. At smoking ligature provided. Also I presented her with a cholesterol-lowering diet handout  as well as information on hyperlipidemia.

## 2013-07-02 ENCOUNTER — Ambulatory Visit (HOSPITAL_COMMUNITY)
Admission: RE | Admit: 2013-07-02 | Discharge: 2013-07-02 | Disposition: A | Discharge status: Home / Self Care | Payer: 59 | Source: Ambulatory Visit | Attending: Gynecology | Admitting: Gynecology

## 2013-07-02 DIAGNOSIS — F17200 Nicotine dependence, unspecified, uncomplicated: Secondary | ICD-10-CM | POA: Insufficient documentation

## 2013-09-18 ENCOUNTER — Other Ambulatory Visit: Payer: Self-pay | Admitting: Dermatology

## 2013-09-18 ENCOUNTER — Ambulatory Visit (INDEPENDENT_AMBULATORY_CARE_PROVIDER_SITE_OTHER): Payer: 59

## 2013-09-18 DIAGNOSIS — Z23 Encounter for immunization: Secondary | ICD-10-CM

## 2013-10-18 ENCOUNTER — Telehealth: Payer: Self-pay | Admitting: Internal Medicine

## 2013-10-18 ENCOUNTER — Telehealth: Payer: Self-pay | Admitting: *Deleted

## 2013-10-18 DIAGNOSIS — M8580 Other specified disorders of bone density and structure, unspecified site: Secondary | ICD-10-CM

## 2013-10-18 NOTE — Telephone Encounter (Signed)
Pt called requesting bone density order placed at [facility] center, pt will call to schedule.

## 2013-10-19 NOTE — Telephone Encounter (Signed)
Patient notified and recall changed.

## 2013-10-19 NOTE — Telephone Encounter (Signed)
She can be a 10 year recall - 2019.

## 2013-10-19 NOTE — Telephone Encounter (Signed)
Patient states she has [employer] that will run out in December. She is asking to have her colonoscopy now (recall 11/2014). She does not have any problems, no change in her health history. Please, advise.

## 2013-11-05 ENCOUNTER — Encounter: Payer: Self-pay | Admitting: Gynecology

## 2013-11-12 ENCOUNTER — Ambulatory Visit
Admission: RE | Admit: 2013-11-12 | Discharge: 2013-11-12 | Disposition: A | Discharge status: Home / Self Care | Payer: 59 | Source: Ambulatory Visit | Attending: Gynecology | Admitting: Gynecology

## 2013-11-12 DIAGNOSIS — M8580 Other specified disorders of bone density and structure, unspecified site: Secondary | ICD-10-CM

## 2013-11-19 ENCOUNTER — Other Ambulatory Visit: Payer: Self-pay | Admitting: Family

## 2013-11-19 ENCOUNTER — Other Ambulatory Visit (INDEPENDENT_AMBULATORY_CARE_PROVIDER_SITE_OTHER): Payer: 59

## 2013-11-19 ENCOUNTER — Encounter: Payer: Self-pay | Admitting: Family

## 2013-11-19 ENCOUNTER — Ambulatory Visit (INDEPENDENT_AMBULATORY_CARE_PROVIDER_SITE_OTHER): Payer: 59 | Admitting: Family

## 2013-11-19 VITALS — BP 116/70 | HR 84 | Temp 98.3°F | Resp 18 | Ht 65.0 in | Wt 115.8 lb

## 2013-11-19 DIAGNOSIS — Z0000 Encounter for general adult medical examination without abnormal findings: Secondary | ICD-10-CM

## 2013-11-19 DIAGNOSIS — D7589 Other specified diseases of blood and blood-forming organs: Secondary | ICD-10-CM

## 2013-11-19 LAB — CBC
HCT: 47.1 % — ABNORMAL HIGH (ref 36.0–46.0)
Hemoglobin: 15.7 g/dL — ABNORMAL HIGH (ref 12.0–15.0)
MCHC: 33.4 g/dL (ref 30.0–36.0)
MCV: 101.6 fl — ABNORMAL HIGH (ref 78.0–100.0)
PLATELETS: 177 10*3/uL (ref 150.0–400.0)
RBC: 4.64 Mil/uL (ref 3.87–5.11)
RDW: 13.4 % (ref 11.5–15.5)
WBC: 5.9 10*3/uL (ref 4.0–10.5)

## 2013-11-19 LAB — LIPID PANEL
CHOLESTEROL: 225 mg/dL — AB (ref 0–200)
HDL: 76.1 mg/dL (ref >39.00)
LDL Cholesterol: 136 mg/dL — ABNORMAL HIGH (ref 0–99)
NonHDL: 148.9
TRIGLYCERIDES: 64 mg/dL (ref 0.0–149.0)
Total CHOL/HDL Ratio: 3
VLDL: 12.8 mg/dL (ref 0.0–40.0)

## 2013-11-19 LAB — BASIC METABOLIC PANEL
BUN: 17 mg/dL (ref 6–23)
CHLORIDE: 103 meq/L (ref 96–112)
CO2: 26 mEq/L (ref 19–32)
Calcium: 9.9 mg/dL (ref 8.4–10.5)
Creatinine, Ser: 0.7 mg/dL (ref 0.4–1.2)
GFR: 88.73 mL/min (ref >60.00)
Glucose, Bld: 91 mg/dL (ref 70–99)
POTASSIUM: 5 meq/L (ref 3.5–5.1)
SODIUM: 141 meq/L (ref 135–145)

## 2013-11-19 LAB — TSH: TSH: 1.38 u[IU]/mL (ref 0.35–4.50)

## 2013-11-19 NOTE — Patient Instructions (Signed)
Thank you for choosing [facility].  Summary/Instructions:  Please stop by the lab on the basement level of the building for your blood work. Your results will be released to MyChart (or called to you) after review, usually within 72hours after test completion. If any changes need to be made, you will be notified at that same time.  Health Maintenance Adopting a healthy lifestyle and getting preventive care can go a long way to promote health and wellness. Talk with your health care provider about what schedule of regular examinations is right for you. This is a good chance for you to check in with your provider about disease prevention and staying healthy. In between checkups, there are plenty of things you can do on your own. Experts have done a lot of research about which lifestyle changes and preventive measures are most likely to keep you healthy. Ask your health care provider for more information. WEIGHT AND DIET  Eat a healthy diet  Be sure to include plenty of vegetables, fruits, low-fat dairy products, and lean protein.  Do not eat a lot of foods high in solid fats, added sugars, or salt.  Get regular exercise. This is one of the most important things you can do for your health.  Most adults should exercise for at least 150 minutes each week. The exercise should increase your heart rate and make you sweat (moderate-intensity exercise).  Most adults should also do strengthening exercises at least twice a week. This is in addition to the moderate-intensity exercise.  Maintain a healthy weight  Body mass index (BMI) is a measurement that can be used to identify possible weight problems. It estimates body fat based on height and weight. Your health care provider can help determine your BMI and help you achieve or maintain a healthy weight.  For females 20 years of age and older:   A BMI below 18.5 is considered underweight.  A BMI of 18.5 to 24.9 is normal.  A BMI of 25  to 29.9 is considered overweight.  A BMI of 30 and above is considered obese.  Watch levels of cholesterol and blood lipids  You should start having your blood tested for lipids and cholesterol at 61 years of age, then have this test every 5 years.  You may need to have your cholesterol levels checked more often if:  Your lipid or cholesterol levels are high.  You are older than 61 years of age.  You are at high risk for heart disease.  CANCER SCREENING   Lung Cancer  Lung cancer screening is recommended for adults 55-80 years old who are at high risk for lung cancer because of a history of smoking.  A yearly low-dose CT scan of the lungs is recommended for people who:  Currently smoke.  Have quit within the past 15 years.  Have at least a 30-pack-year history of smoking. A pack year is smoking an average of one pack of cigarettes a day for 1 year.  Yearly screening should continue until it has been 15 years since you quit.  Yearly screening should stop if you develop a health problem that would prevent you from having lung cancer treatment.  Breast Cancer  Practice breast self-awareness. This means understanding how your breasts normally appear and feel.  It also means doing regular breast self-exams. Let your health care provider know about any changes, no matter how small.  If you are in your 20s or 30s, you should have a clinical breast exam (  exam (CBE) by a health care provider every 1-3 years as part of a regular health exam.  If you are 61 or older, have a CBE every year. Also consider having a breast X-ray (mammogram) every year.  If you have a family history of breast cancer, talk to your health care provider about genetic screening.  If you are at high risk for breast cancer, talk to your health care provider about having an MRI and a mammogram every year.  Breast cancer gene (BRCA) assessment is recommended for women who have family members with BRCA-related  cancers. BRCA-related cancers include:  Breast.  Ovarian.  Tubal.  Peritoneal cancers.  Results of the assessment will determine the need for genetic counseling and BRCA1 and BRCA2 testing. Cervical Cancer Routine pelvic examinations to screen for cervical cancer are no longer recommended for nonpregnant women who are considered low risk for cancer of the pelvic organs (ovaries, uterus, and vagina) and who do not have symptoms. A pelvic examination may be necessary if you have symptoms including those associated with pelvic infections. Ask your health care provider if a screening pelvic exam is right for you.   The Pap test is the screening test for cervical cancer for women who are considered at risk.  If you had a hysterectomy for a problem that was not cancer or a condition that could lead to cancer, then you no longer need Pap tests.  If you are older than 65 years, and you have had normal Pap tests for the past 10 years, you no longer need to have Pap tests.  If you have had past treatment for cervical cancer or a condition that could lead to cancer, you need Pap tests and screening for cancer for at least 20 years after your treatment.  If you no longer get a Pap test, assess your risk factors if they change (such as having a new sexual partner). This can affect whether you should start being screened again.  Some women have medical problems that increase their chance of getting cervical cancer. If this is the case for you, your health care provider may recommend more frequent screening and Pap tests.  The human papillomavirus (HPV) test is another test that may be used for cervical cancer screening. The HPV test looks for the virus that can cause cell changes in the cervix. The cells collected during the Pap test can be tested for HPV.  The HPV test can be used to screen women 64 years of age and older. Getting tested for HPV can extend the interval between normal Pap tests from  three to five years.  An HPV test also should be used to screen women of any age who have unclear Pap test results.  After 61 years of age, women should have HPV testing as often as Pap tests.  Colorectal Cancer  This type of cancer can be detected and often prevented.  Routine colorectal cancer screening usually begins at 61 years of age and continues through 61 years of age.  Your health care provider may recommend screening at an earlier age if you have risk factors for colon cancer.  Your health care provider may also recommend using home test kits to check for hidden blood in the stool.  A small camera at the end of a tube can be used to examine your colon directly (sigmoidoscopy or colonoscopy). This is done to check for the earliest forms of colorectal cancer.  Routine screening usually begins at age 67.  Direct examination of the colon should be repeated every 5-10 years through 61 years of age. However, you may need to be screened more often if early forms of precancerous polyps or small growths are found. Skin Cancer  Check your skin from head to toe regularly.  Tell your health care provider about any new moles or changes in moles, especially if there is a change in a mole's shape or color.  Also tell your health care provider if you have a mole that is larger than the size of a pencil eraser.  Always use sunscreen. Apply sunscreen liberally and repeatedly throughout the day.  Protect yourself by wearing long sleeves, pants, a wide-brimmed hat, and sunglasses whenever you are outside. HEART DISEASE, DIABETES, AND HIGH BLOOD PRESSURE   Have your blood pressure checked at least every 1-2 years. High blood pressure causes heart disease and increases the risk of stroke.  If you are between 55 years and 79 years old, ask your health care provider if you should take aspirin to prevent strokes.  Have regular diabetes screenings. This involves taking a blood sample to check  your fasting blood sugar level.  If you are at a normal weight and have a low risk for diabetes, have this test once every three years after 61 years of age.  If you are overweight and have a high risk for diabetes, consider being tested at a younger age or more often. PREVENTING INFECTION  Hepatitis B  If you have a higher risk for hepatitis B, you should be screened for this virus. You are considered at high risk for hepatitis B if:  You were born in a country where hepatitis B is common. Ask your health care provider which countries are considered high risk.  Your parents were born in a high-risk country, and you have not been immunized against hepatitis B (hepatitis B vaccine).  You have HIV or AIDS.  You use needles to inject street drugs.  You live with someone who has hepatitis B.  You have had sex with someone who has hepatitis B.  You get hemodialysis treatment.  You take certain medicines for conditions, including cancer, organ transplantation, and autoimmune conditions. Hepatitis C  Blood testing is recommended for:  Everyone born from 1945 through 1965.  Anyone with known risk factors for hepatitis C. Sexually transmitted infections (STIs)  You should be screened for sexually transmitted infections (STIs) including gonorrhea and chlamydia if:  You are sexually active and are younger than 61 years of age.  You are older than 61 years of age and your health care provider tells you that you are at risk for this type of infection.  Your sexual activity has changed since you were last screened and you are at an increased risk for chlamydia or gonorrhea. Ask your health care provider if you are at risk.  If you do not have HIV, but are at risk, it may be recommended that you take a prescription medicine daily to prevent HIV infection. This is called pre-exposure prophylaxis (PrEP). You are considered at risk if:  You are sexually active and do not regularly use  condoms or know the HIV status of your partner(s).  You take drugs by injection.  You are sexually active with a partner who has HIV. Talk with your health care provider about whether you are at high risk of being infected with HIV. If you choose to begin PrEP, you should first be tested for HIV. You should then be   tested every 3 months for as long as you are taking PrEP.  PREGNANCY   If you are premenopausal and you may become pregnant, ask your health care provider about preconception counseling.  If you may become pregnant, take 400 to 800 micrograms (mcg) of folic acid every day.  If you want to prevent pregnancy, talk to your health care provider about birth control (contraception). OSTEOPOROSIS AND MENOPAUSE   Osteoporosis is a disease in which the bones lose minerals and strength with aging. This can result in serious bone fractures. Your risk for osteoporosis can be identified using a bone density scan.  If you are 23 years of age or older, or if you are at risk for osteoporosis and fractures, ask your health care provider if you should be screened.  Ask your health care provider whether you should take a calcium or vitamin D supplement to lower your risk for osteoporosis.  Menopause may have certain physical symptoms and risks.  Hormone replacement therapy may reduce some of these symptoms and risks. Talk to your health care provider about whether hormone replacement therapy is right for you.  HOME CARE INSTRUCTIONS   Schedule regular health, dental, and eye exams.  Stay current with your immunizations.   Do not use any tobacco products including cigarettes, chewing tobacco, or electronic cigarettes.  If you are pregnant, do not drink alcohol.  If you are breastfeeding, limit how much and how often you drink alcohol.  Limit alcohol intake to no more than 1 drink per day for nonpregnant women. One drink equals 12 ounces of beer, 5 ounces of wine, or 1 ounces of hard  liquor.  Do not use street drugs.  Do not share needles.  Ask your health care provider for help if you need support or information about quitting drugs.  Tell your health care provider if you often feel depressed.  Tell your health care provider if you have ever been abused or do not feel safe at home. Document Released: 07/06/2010 Document Revised: 05/07/2013 Document Reviewed: 11/22/2012 [facility] Patient Information 2015 [place], [place]. This information is not intended to replace advice given to you by your health care provider. Make sure you discuss any questions you have with your health care provider.

## 2013-11-19 NOTE — Assessment & Plan Note (Addendum)
1) Anticipatory Guidance: Discussed importance of wearing a seatbelt while driving and not texting while driving; changing batteries in smoke detector at least once annually; wearing suntan lotion when outside; eating a balanced and moderate diet; getting physical activity at least 30 minutes per day.  2) Immunizations / Screenings / Labs:  All immunizations and recommended health screenings are up to date. Obtain TSH, Lipid profile, CBC and BMET  Overall well exam. Discussed risk factors associated with cardiovascular disease with heredity and smoking as her major risk factors. Pending lab results will consider low threshold for adding cholesterol medication for reduction of risk factors.   Follow up in 1 year for annual exam.

## 2013-11-19 NOTE — Progress Notes (Signed)
Subjective:    Patient ID: Tanya [name], female    DOB: [birth year], 63 y.o.   MRN: [id number]  Chief Complaint  Patient presents with  . Annual Exam    [facility]    HPI:  Tanya [name] is a 63 y.o. female who presents today for an annual wellness visit.  1) Health Maintenance - Overall health has been good. GYN is concerned about her cholesterol. Left hip had a fall 2 years ago, still has some on and off pain.  Diet - Trying to cut down on saturated fats, eats well overall.  Exercise - Yard work, walk the dog. No structured exercise.   2) Preventative Exams / Immunizations:  Dental -- Up to date Vision -- Up to date   Health Maintenance  Topic Date Due  . INFLUENZA VACCINE  08/05/2014  . COLONOSCOPY  11/27/2014  . PAP SMEAR  04/27/2015  . MAMMOGRAM  06/13/2015  . ZOSTAVAX  Completed   Immunization History  Administered Date(s) Administered  . Influenza Split 10/05/2011  . Influenza Whole 09/04/2008  . Influenza,inj,Quad PF,36+ Mos 10/10/2012, 09/18/2013  . Pneumococcal Polysaccharide-23 11/04/2008  . Zoster 03/17/2012   Allergies  Allergen Reactions  . Codeine     REACTION: Rash  . Tetanus Toxoid     REACTION: Shock   Current Outpatient Prescriptions on File Prior to Visit  Medication Sig Dispense Refill  . Calcium Carbonate-Vit D-Min (CALCIUM 1200 PO) Take by mouth. Take 600/400 two times a day    . cholecalciferol (VITAMIN D) 1000 UNITS tablet Take 1,000 Units by mouth 2 (two) times daily.    Marland Kitchen glucosamine-chondroitin 500-400 MG tablet Take 1 tablet by mouth 3 (three) times daily. OSTEO BI-FLEX JOINT SHEILD    . raloxifene (EVISTA) 60 MG tablet TAKE 1 TABLET BY MOUTH ONCE DAILY 90 tablet 4   No current facility-administered medications on file prior to visit.   Past Medical History  Diagnosis Date  . COPD, mild   . Smoker   . Osteopenia     DEXA 10/27/11: -1.4, no change from 10/2009  . Shingles   . Breast cancer    History   Social  History  . Marital Status: Married    Spouse Name: N/A    Number of Children: N/A  . Years of Education: N/A   Occupational History  . Not on file.   Social History Main Topics  . Smoking status: Current Every Day Smoker -- 1.00 packs/day    Types: Cigarettes  . Smokeless tobacco: Never Used  . Alcohol Use: Yes     Comment: 5 DRINKS A WEEK  . Drug Use: No  . Sexual Activity: No   Other Topics Concern  . Not on file   Social History Narrative   RISK FACTORS History  Smoking status  . Current Every Day Smoker -- 1.00 packs/day for 30 years  . Types: Cigarettes  Smokeless tobacco  . Never Used     Review of Systems  Constitutional: Denies fever, chills, fatigue, or significant weight gain/loss. HENT: Head: Denies headache or neck pain Ears: Denies changes in hearing, ringing in ears, earache, drainage Nose: Denies discharge, stuffiness, itching, nosebleed, sinus pain Throat: Denies sore throat, hoarseness, dry mouth, sores, thrush Eyes: Denies loss/changes in vision, pain, redness, blurry/double vision, flashing lights Cardiovascular: Denies chest pain/discomfort, tightness, palpitations, shortness of breath with activity, difficulty lying down, swelling, sudden awakening with shortness of breath Respiratory: Denies shortness of breath, cough, sputum production, wheezing  Gastrointestinal: Denies dysphasia, heartburn, change in appetite, nausea, change in bowel habits, rectal bleeding, constipation, diarrhea, yellow skin or eyes Genitourinary: Denies frequency, urgency, burning/pain, blood in urine, incontinence, change in urinary strength. Musculoskeletal: Denies stiffness, back pain, redness or swelling of joints Hip soreness from a previous fall.  Skin: Denies rashes, lumps, itching, dryness, color changes, or hair/nail changes Neurological: Denies dizziness, fainting, seizures, weakness, numbness, tingling, tremor Psychiatric - Denies nervousness, stress, depression  or memory loss Endocrine: Denies heat or cold intolerance, sweating, frequent urination, excessive thirst, changes in appetite Hematologic: Denies ease of bruising or bleeding    Objective:    BP 116/70 mmHg  Pulse 84  Temp(Src) 98.3 F (36.8 C) (Oral)  Resp 18  Ht 5\' 5"  (1.651 m)  Wt 115 lb 12 oz (52.504 kg)  BMI 19.26 kg/m2  SpO2 97% Nursing note and vital signs reviewed.  Physical Exam  Constitutional: She is oriented to person, place, and time. She appears well-developed and well-nourished.  HENT:  Head: Normocephalic.  Right Ear: Hearing, tympanic membrane, external ear and ear canal normal.  Left Ear: Hearing, tympanic membrane, external ear and ear canal normal.  Nose: Nose normal.  Mouth/Throat: Uvula is midline, oropharynx is clear and moist and mucous membranes are normal.  Eyes: Conjunctivae and EOM are normal. Pupils are equal, round, and reactive to light.  Neck: Neck supple. No JVD present. No tracheal deviation present. No thyromegaly present.  Cardiovascular: Normal rate, regular rhythm, normal heart sounds and intact distal pulses.   Pulmonary/Chest: Effort normal and breath sounds normal.  Abdominal: Soft. Bowel sounds are normal. She exhibits no distension and no mass. There is no tenderness. There is no rebound and no guarding.  Musculoskeletal: Normal range of motion. She exhibits no edema or tenderness.  No palpable hip soreness elicited.  Lymphadenopathy:    She has no cervical adenopathy.  Neurological: She is alert and oriented to person, place, and time. She has normal reflexes. No cranial nerve deficit. She exhibits normal muscle tone. Coordination normal.  Skin: Skin is warm and dry.  Psychiatric: She has a normal mood and affect. Her behavior is normal. Judgment and thought content normal.       Assessment & Plan:

## 2013-11-19 NOTE — Progress Notes (Signed)
Pre visit review using our clinic review tool, if applicable. No additional management support is needed unless otherwise documented below in the visit note. 

## 2013-12-03 ENCOUNTER — Other Ambulatory Visit: Payer: Self-pay | Admitting: Gynecology

## 2013-12-07 ENCOUNTER — Other Ambulatory Visit (INDEPENDENT_AMBULATORY_CARE_PROVIDER_SITE_OTHER): Payer: 59

## 2013-12-07 DIAGNOSIS — D7589 Other specified diseases of blood and blood-forming organs: Secondary | ICD-10-CM

## 2013-12-08 LAB — B12 AND FOLATE PANEL
Folate: 18.4 ng/mL (ref >5.9)
Vitamin B-12: 384 pg/mL (ref 211–911)

## 2013-12-09 ENCOUNTER — Encounter: Payer: Self-pay | Admitting: Family

## 2014-03-18 ENCOUNTER — Telehealth: Payer: Self-pay | Admitting: *Deleted

## 2014-03-18 MED ORDER — RALOXIFENE HCL 60 MG PO TABS: 60.0000 mg | ORAL_TABLET | Freq: Every day | ORAL | Status: DC

## 2014-03-18 NOTE — Telephone Encounter (Signed)
Pt has new insurance asked if her Rx for Evista 60 mg could be printed and she will pick up to take to pharmacy. This was done pt annual due in June 2016.

## 2014-04-03 ENCOUNTER — Ambulatory Visit (INDEPENDENT_AMBULATORY_CARE_PROVIDER_SITE_OTHER)
Admission: RE | Admit: 2014-04-03 | Discharge: 2014-04-03 | Disposition: A | Discharge status: Home / Self Care | Payer: 59 | Source: Ambulatory Visit | Attending: Internal Medicine | Admitting: Internal Medicine

## 2014-04-03 ENCOUNTER — Telehealth: Payer: Self-pay | Admitting: Internal Medicine

## 2014-04-03 ENCOUNTER — Ambulatory Visit (INDEPENDENT_AMBULATORY_CARE_PROVIDER_SITE_OTHER): Payer: 59 | Admitting: Internal Medicine

## 2014-04-03 ENCOUNTER — Encounter: Payer: Self-pay | Admitting: Internal Medicine

## 2014-04-03 ENCOUNTER — Other Ambulatory Visit (INDEPENDENT_AMBULATORY_CARE_PROVIDER_SITE_OTHER): Payer: 59

## 2014-04-03 VITALS — BP 148/90 | HR 92 | Ht 65.5 in | Wt 117.0 lb

## 2014-04-03 DIAGNOSIS — Z0000 Encounter for general adult medical examination without abnormal findings: Secondary | ICD-10-CM | POA: Insufficient documentation

## 2014-04-03 DIAGNOSIS — M25552 Pain in left hip: Secondary | ICD-10-CM | POA: Diagnosis not present

## 2014-04-03 DIAGNOSIS — R072 Precordial pain: Secondary | ICD-10-CM

## 2014-04-03 DIAGNOSIS — M8580 Other specified disorders of bone density and structure, unspecified site: Secondary | ICD-10-CM

## 2014-04-03 DIAGNOSIS — R0789 Other chest pain: Secondary | ICD-10-CM

## 2014-04-03 LAB — HEPATIC FUNCTION PANEL
ALBUMIN: 4.5 g/dL (ref 3.5–5.2)
ALT: 13 U/L (ref 0–35)
AST: 20 U/L (ref 0–37)
Alkaline Phosphatase: 40 U/L (ref 39–117)
BILIRUBIN DIRECT: 0.1 mg/dL (ref 0.0–0.3)
TOTAL PROTEIN: 7.4 g/dL (ref 6.0–8.3)
Total Bilirubin: 0.5 mg/dL (ref 0.2–1.2)

## 2014-04-03 LAB — BASIC METABOLIC PANEL
BUN: 15 mg/dL (ref 6–23)
CALCIUM: 9.9 mg/dL (ref 8.4–10.5)
CO2: 33 mEq/L — ABNORMAL HIGH (ref 19–32)
CREATININE: 0.73 mg/dL (ref 0.40–1.20)
Chloride: 101 mEq/L (ref 96–112)
GFR: 85.82 mL/min (ref >60.00)
Glucose, Bld: 97 mg/dL (ref 70–99)
Potassium: 4.2 mEq/L (ref 3.5–5.1)
Sodium: 138 mEq/L (ref 135–145)

## 2014-04-03 LAB — CBC WITH DIFFERENTIAL/PLATELET
BASOS PCT: 0.7 % (ref 0.0–3.0)
Basophils Absolute: 0 10*3/uL (ref 0.0–0.1)
Eosinophils Absolute: 0 10*3/uL (ref 0.0–0.7)
Eosinophils Relative: 0.9 % (ref 0.0–5.0)
HEMATOCRIT: 45.3 % (ref 36.0–46.0)
Hemoglobin: 15.6 g/dL — ABNORMAL HIGH (ref 12.0–15.0)
LYMPHS ABS: 1.6 10*3/uL (ref 0.7–4.0)
Lymphocytes Relative: 31.2 % (ref 12.0–46.0)
MCHC: 34.4 g/dL (ref 30.0–36.0)
MCV: 98.4 fl (ref 78.0–100.0)
Monocytes Absolute: 0.4 10*3/uL (ref 0.1–1.0)
Monocytes Relative: 7.7 % (ref 3.0–12.0)
NEUTROS ABS: 3.1 10*3/uL (ref 1.4–7.7)
Neutrophils Relative %: 59.5 % (ref 43.0–77.0)
PLATELETS: 172 10*3/uL (ref 150.0–400.0)
RBC: 4.61 Mil/uL (ref 3.87–5.11)
RDW: 13.2 % (ref 11.5–15.5)
WBC: 5.2 10*3/uL (ref 4.0–10.5)

## 2014-04-03 LAB — LIPID PANEL
Cholesterol: 221 mg/dL — ABNORMAL HIGH (ref 0–200)
HDL: 88.9 mg/dL (ref >39.00)
LDL Cholesterol: 113 mg/dL — ABNORMAL HIGH (ref 0–99)
NonHDL: 132.1
Total CHOL/HDL Ratio: 2
Triglycerides: 96 mg/dL (ref 0.0–149.0)
VLDL: 19.2 mg/dL (ref 0.0–40.0)

## 2014-04-03 LAB — URINALYSIS
Bilirubin Urine: NEGATIVE
KETONES UR: NEGATIVE
Leukocytes, UA: NEGATIVE
Nitrite: NEGATIVE
PH: 7 (ref 5.0–8.0)
Total Protein, Urine: NEGATIVE
URINE GLUCOSE: NEGATIVE
UROBILINOGEN UA: 0.2 (ref 0.0–1.0)

## 2014-04-03 LAB — TSH: TSH: 1.54 u[IU]/mL (ref 0.35–4.50)

## 2014-04-03 MED ORDER — ASPIRIN 325 MG PO TABS: 325.0000 mg | ORAL_TABLET | Freq: Every day | ORAL | Status: DC

## 2014-04-03 MED ORDER — NITROGLYCERIN 0.4 MG SL SUBL: 0.4000 mg | SUBLINGUAL_TABLET | SUBLINGUAL | Status: DC | PRN

## 2014-04-03 NOTE — Progress Notes (Signed)
   Subjective:    Patient ID: Tanya [name], female    DOB: June 06, 1950, 64 y.o.   MRN: [id number]  HPI  The patient is here for a wellness exam. The patient is c/o chest tightness x3, last on 3/26. One CP episode woke her up  - 20 min. No exertional sx's  The patient needs to address osteopenia  Wt Readings from Last 3 Encounters:  04/03/14 117 lb (53.071 kg)  11/19/13 115 lb 12 oz (52.504 kg)  06/08/13 119 lb (53.978 kg)   BP Readings from Last 3 Encounters:  04/03/14 148/90  11/19/13 116/70  06/08/13 130/84       Review of Systems  Constitutional: Positive for unexpected weight change. Negative for fever, chills, diaphoresis, activity change, appetite change and fatigue.  HENT: Negative for congestion, dental problem, ear pain, hearing loss, mouth sores, postnasal drip, sinus pressure, sneezing, sore throat and voice change.   Eyes: Negative for pain and visual disturbance.  Respiratory: Negative for cough, chest tightness, shortness of breath, wheezing and stridor.   Cardiovascular: Positive for chest pain. Negative for palpitations and leg swelling.  Gastrointestinal: Negative for nausea, vomiting, abdominal pain, blood in stool, abdominal distention and rectal pain.  Genitourinary: Negative for dysuria, frequency, hematuria, decreased urine volume, vaginal bleeding, vaginal discharge, difficulty urinating, vaginal pain and menstrual problem.  Musculoskeletal: Positive for arthralgias. Negative for back pain, joint swelling, gait problem and neck pain.  Skin: Negative for color change, rash and wound.  Neurological: Negative for dizziness, tremors, syncope, speech difficulty, weakness and light-headedness.  Hematological: Negative for adenopathy.  Psychiatric/Behavioral: Negative for suicidal ideas, hallucinations, behavioral problems, confusion, sleep disturbance, dysphoric mood and decreased concentration. The patient is not nervous/anxious and is not hyperactive.          Objective:   Physical Exam  Constitutional: She appears well-developed. No distress.  HENT:  Head: Normocephalic.  Right Ear: External ear normal.  Left Ear: External ear normal.  Nose: Nose normal.  Mouth/Throat: Oropharynx is clear and moist.  Eyes: Conjunctivae are normal. Pupils are equal, round, and reactive to light. Right eye exhibits no discharge. Left eye exhibits no discharge.  Neck: Normal range of motion. Neck supple. No JVD present. No tracheal deviation present. No thyromegaly present.  Cardiovascular: Normal rate, regular rhythm and normal heart sounds.   Pulmonary/Chest: No stridor. No respiratory distress. She has no wheezes.  Abdominal: Soft. Bowel sounds are normal. She exhibits no distension and no mass. There is no tenderness. There is no rebound and no guarding.  Musculoskeletal: She exhibits tenderness. She exhibits no edema.  Lymphadenopathy:    She has no cervical adenopathy.  Neurological: She displays normal reflexes. No cranial nerve deficit. She exhibits normal muscle tone. Coordination normal.  Skin: No rash noted. No erythema.  Psychiatric: She has a normal mood and affect. Her behavior is normal. Judgment and thought content normal.   Thin L hip is tender w/ROM Stiff LS spine   EKG       Assessment & Plan:

## 2014-04-03 NOTE — Telephone Encounter (Signed)
Pt called in said that the meds nitroGLYCERIN (NITROSTAT) 0.4 MG SL tablet was give to her for 20 count but they only come in per package of 25. Can she get a new script called in or written for 25 count. ?      [facility] on [address] [phone]

## 2014-04-03 NOTE — Progress Notes (Signed)
Pre visit review using our clinic review tool, if applicable. No additional management support is needed unless otherwise documented below in the visit note. 

## 2014-04-03 NOTE — Telephone Encounter (Signed)
Notified pt resent for 25ct...Tanya [name]

## 2014-04-03 NOTE — Assessment & Plan Note (Signed)
Vit D, Evista Start yoga or pilates

## 2014-04-03 NOTE — Assessment & Plan Note (Addendum)
Labs EKG, CXR Start ASA Start NTG prn. To ER if CP>10 min Card ref this or next week D/c smoking

## 2014-04-03 NOTE — Assessment & Plan Note (Signed)
Likely OA X ray if not better

## 2014-04-03 NOTE — Assessment & Plan Note (Signed)
We discussed age appropriate health related issues, including available/recomended screening tests and vaccinations. We discussed a need for adhering to healthy diet and exercise. Labs/EKG were reviewed/ordered. All questions were answered.   

## 2014-04-08 ENCOUNTER — Encounter: Payer: Self-pay | Admitting: Internal Medicine

## 2014-04-09 ENCOUNTER — Telehealth: Payer: Self-pay | Admitting: Internal Medicine

## 2014-05-06 ENCOUNTER — Ambulatory Visit: Payer: 59 | Admitting: Internal Medicine

## 2014-05-08 ENCOUNTER — Other Ambulatory Visit: Payer: Self-pay

## 2014-05-08 DIAGNOSIS — Z853 Personal history of malignant neoplasm of breast: Secondary | ICD-10-CM

## 2014-05-08 DIAGNOSIS — Z1231 Encounter for screening mammogram for malignant neoplasm of breast: Secondary | ICD-10-CM

## 2014-05-08 DIAGNOSIS — Z9889 Other specified postprocedural states: Secondary | ICD-10-CM

## 2014-05-13 ENCOUNTER — Encounter: Payer: Self-pay | Admitting: Internal Medicine

## 2014-05-28 ENCOUNTER — Encounter: Payer: Self-pay | Admitting: Cardiology

## 2014-05-28 ENCOUNTER — Ambulatory Visit (INDEPENDENT_AMBULATORY_CARE_PROVIDER_SITE_OTHER): Payer: 59 | Admitting: Cardiology

## 2014-05-28 VITALS — BP 140/60 | HR 88 | Ht 65.0 in | Wt 118.9 lb

## 2014-05-28 DIAGNOSIS — R072 Precordial pain: Secondary | ICD-10-CM

## 2014-05-28 NOTE — Progress Notes (Signed)
Cardiology Office Note   Date:  05/28/2014   ID:  Tanya [name] February 29, 1950, MRN [id number]  PCP:  Gwendolyn Grant, MD  Cardiologist:   [name], MD   Chief Complaint  Patient presents with  . Chest Pain      History of Present Illness: Tanya [name] is a 64 y.o. female who presents for evaluation of chest pain. She has no prior cardiac history. Her risk factor is smoking. She said she's had chest discomfort 4 times. The first time was in March and the last time was April 10. It was a somewhat pressure-like discomfort that came in waves. It was across her chest. It lasted for 5-10 minutes. It went away spontaneously came on at rest. There was no radiation. There were no associated symptoms such as nausea vomiting or shortness of breath. She didn't have palpitations, presyncope or syncope. She has had no recurrence of this can be very active doing household chores and pushing a mower.  She does not have significant shortness of breath, PND or orthopnea.    Past Medical History  Diagnosis Date  . COPD, mild   . Smoker   . Osteopenia     DEXA 10/27/11: -1.4, no change from 10/2009  . Shingles   . Breast cancer     Radiation and lumpectomy    Past Surgical History  Procedure Laterality Date  . Tonsillectomy and adenoidectomy    . Dilation and evacuation    . Breast lumpectomy    . Breast biopsy       Current Outpatient Prescriptions  Medication Sig Dispense Refill  . aspirin (BAYER ASPIRIN) 325 MG tablet Take 1 tablet (325 mg total) by mouth daily. 100 tablet 3  . cholecalciferol (VITAMIN D) 1000 UNITS tablet Take 1,000 Units by mouth 2 (two) times daily.    . nitroGLYCERIN (NITROSTAT) 0.4 MG SL tablet Place 1 tablet (0.4 mg total) under the tongue every 5 (five) minutes as needed for chest pain. 25 tablet 3   No current facility-administered medications for this visit.    Allergies:   Codeine and Tetanus toxoid    Social History:  The patient   reports that she has been smoking Cigarettes.  She has a 30 pack-year smoking history. She has never used smokeless tobacco. She reports that she drinks alcohol. She reports that she does not use illicit drugs.   Family History:  The patient's family history includes Breast cancer (age of onset: 42) in her mother; CAD in her mother; Cancer in her mother; Hearing loss in her maternal grandfather; Heart disease in her paternal grandfather; Heart disease (age of onset: 11) in her father; Hypertension in her father and mother.    ROS:  Please see the history of present illness.   Otherwise, review of systems are positive for back pain..   All other systems are reviewed and negative.    PHYSICAL EXAM: VS:  BP 140/60 mmHg  Pulse 88  Ht 5\' 5"  (1.651 m)  Wt 118 lb 14.4 oz (53.933 kg)  BMI 19.79 kg/m2 , BMI Body mass index is 19.79 kg/(m^2). GENERAL:  Well appearing HEENT:  Pupils equal round and reactive, fundi not visualized, oral mucosa unremarkable NECK:  No jugular venous distention, waveform within normal limits, carotid upstroke brisk and symmetric, no bruits, no thyromegaly LYMPHATICS:  No cervical, inguinal adenopathy LUNGS:  Clear to auscultation bilaterally BACK:  No CVA tenderness CHEST:  Unremarkable HEART:  PMI not displaced or sustained,S1  and S2 within normal limits, no S3, no S4, no clicks, no rubs, no murmurs ABD:  Flat, positive bowel sounds normal in frequency in pitch, no bruits, no rebound, no guarding, no midline pulsatile mass, no hepatomegaly, no splenomegaly EXT:  2 plus pulses throughout, no edema, no cyanosis no clubbing SKIN:  No rashes no nodules NEURO:  Cranial nerves II through XII grossly intact, motor grossly intact throughout PSYCH:  Cognitively intact, oriented to person place and time    EKG:  EKG is not ordered today. The ekg ordered 04/03/14 demonstrates NSR, rate 72, axis WNL, intervals WNL, no acute ST T wave changes.  05/28/2014   Recent  Labs: 04/03/2014: ALT 13; BUN 15; Creatinine 0.73; Hemoglobin 15.6*; Platelets 172.0; Potassium 4.2; Sodium 138; TSH 1.54    Lipid Panel    Component Value Date/Time   CHOL 221* 04/03/2014 1059   TRIG 96.0 04/03/2014 1059   TRIG 48 08/27/2008   HDL 88.90 04/03/2014 1059   CHOLHDL 2 04/03/2014 1059   VLDL 19.2 04/03/2014 1059   LDLCALC 113* 04/03/2014 1059   LDLCALC 2.6 08/27/2008   LDLDIRECT 127.4 03/13/2012 0752      Wt Readings from Last 3 Encounters:  05/28/14 118 lb 14.4 oz (53.933 kg)  04/03/14 117 lb (53.071 kg)  11/19/13 115 lb 12 oz (52.504 kg)      Other studies Reviewed: Additional studies/ records that were reviewed today include: none.    ASSESSMENT AND PLAN:  CHEST PAIN:  I think the pretest probability of obstructive coronary disease is somewhat low. However, she has risk factors. I will bring the patient back for a POET (Plain Old Exercise Test). This will allow me to screen for obstructive coronary disease, risk stratify and very importantly provide a prescription for exercise.  TOBACCO ABUSE:  We discussed a specific strategy for tobacco cessation.  (Greater than three minutes discussing tobacco cessation.)  STRESS:  She has stress and anxiety as a caregiver.  We discussed strategies for this.    Current medicines are reviewed at length with the patient today.  The patient does not have concerns regarding medicines.  The following changes have been made:  no change  Labs/ tests ordered today include:  No orders of the defined types were placed in this encounter.     Disposition:   FU with me as needed.     Signed, [name], MD  05/28/2014 2:31 PM    [facility]

## 2014-05-28 NOTE — Patient Instructions (Signed)
Your physician recommends that you schedule a follow-up appointment in: as needed with Dr. Hochrein  We are ordering a stress test for you to get done    

## 2014-06-10 ENCOUNTER — Ambulatory Visit (INDEPENDENT_AMBULATORY_CARE_PROVIDER_SITE_OTHER): Payer: 59 | Admitting: Gynecology

## 2014-06-10 ENCOUNTER — Encounter: Payer: Self-pay | Admitting: Gynecology

## 2014-06-10 VITALS — BP 118/68 | Ht 64.75 in | Wt 117.0 lb

## 2014-06-10 DIAGNOSIS — Z853 Personal history of malignant neoplasm of breast: Secondary | ICD-10-CM | POA: Diagnosis not present

## 2014-06-10 DIAGNOSIS — F17200 Nicotine dependence, unspecified, uncomplicated: Secondary | ICD-10-CM

## 2014-06-10 DIAGNOSIS — Z720 Tobacco use: Secondary | ICD-10-CM

## 2014-06-10 DIAGNOSIS — Z01419 Encounter for gynecological examination (general) (routine) without abnormal findings: Secondary | ICD-10-CM

## 2014-06-10 DIAGNOSIS — M8580 Other specified disorders of bone density and structure, unspecified site: Secondary | ICD-10-CM

## 2014-06-10 MED ORDER — RALOXIFENE HCL 60 MG PO TABS: 60.0000 mg | ORAL_TABLET | Freq: Every day | ORAL | Status: DC

## 2014-06-10 NOTE — Patient Instructions (Signed)
Raloxifene tablets What is this medicine? RALOXIFENE (ral OX i feen) reduces the amount of calcium lost from bones. It is used to treat and prevent osteoporosis in women who have experienced menopause. This medicine may be used for other purposes; ask your health care provider or pharmacist if you have questions. COMMON BRAND NAME(S): Evista What should I tell my health care provider before I take this medicine? They need to know if you have any of these conditions: -a history of blood clots -cancer -heart failure -liver disease -premenopausal -an unusual or allergic reaction to raloxifene, other medicines, foods, dyes, or preservatives -pregnant or trying to get pregnant -breast-feeding How should I use this medicine? Take this medicine by mouth with a glass of water. Follow the directions on the prescription label. The tablets can be taken with or without food. Take your doses at regular intervals. Do not take your medicine more often than directed. Talk to your pediatrician regarding the use of this medicine in children. Special care may be needed. Overdosage: If you think you have taken too much of this medicine contact a poison control center or emergency room at once. NOTE: This medicine is only for you. Do not share this medicine with others. What if I miss a dose? If you miss a dose, take it as soon as you can. If it is almost time for your next dose, take only that dose. Do not take double or extra doses. What may interact with this medicine? -ampicillin -cholestyramine -colestipol -diazepam -diazoxide -female hormones like hormone replacement therapy -lidocaine -warfarin This list may not describe all possible interactions. Give your health care provider a list of all the medicines, herbs, non-prescription drugs, or dietary supplements you use. Also tell them if you smoke, drink alcohol, or use illegal drugs. Some items may interact with your medicine. What should I watch  for while using this medicine? Visit your doctor or health care professional for regular checks on your progress. Do not stop taking this medicine except on the advice of your doctor or health care professional. You should make sure you get enough calcium and vitamin D in your diet while you are taking this medicine. Discuss your dietary needs with your health care professional or nutritionist. Exercise may help to prevent bone loss. Discuss your exercise needs with your doctor or health care professional. This medicine can rarely cause blood clots. You should avoid long periods of bed rest while taking this medicine. If you are going to have surgery, tell your doctor or health care professional that you are taking this medicine. This medicine should be stopped at least 3 days before surgery. After surgery, it should be restarted only after you are walking again. It should not be restarted while you still need long periods of bed rest. You should not smoke while taking this medicine. Smoking may also increase your risk of blood clots. Smoking can also decrease the effects of this medicine. This medicine does not prevent hot flashes. It may cause hot flashes in some patients at the start of therapy. What side effects may I notice from receiving this medicine? Side effects that you should report to your doctor or health care professional as soon as possible: -change in vision -chest pain -difficulty breathing -leg pain or swelling -skin rash, itching Side effects that usually do not require medical attention (report to your doctor or health care professional if they continue or are bothersome): -fluid build-up -leg cramps -stomach pain -sweating This list may not   describe all possible side effects. Call your doctor for medical advice about side effects. You may report side effects to FDA at 1-800-FDA-1088. Where should I keep my medicine? Keep out of the reach of children. Store at room  temperature between 15 and 30 degrees C (59 and 86 degrees F). Throw away any unused medicine after the expiration date. NOTE: This sheet is a summary. It may not cover all possible information. If you have questions about this medicine, talk to your doctor, pharmacist, or health care provider.  2015, Elsevier/Gold Standard. (2007-12-07 15:15:14)  

## 2014-06-10 NOTE — Progress Notes (Signed)
Tanya [name] [birth year] [id number]   History:    64 y.o.  for annual gyn exam with no complaints today. She is now seeing Dr. Alain Marion is her primary care physician. 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 Patient's treatment consisted of a left lumpectomy along with radiation and 5 years of tamoxifen. Patient currently on Evista 60 mg daily for osteopenia. Patient has been a smoker for over 20 years. Patient is allergic to tetanus and she has never had a tetanus vaccine. Her shingles vaccine and Pneumovax are up-to-date. She is taking her calcium and vitamin D twice a day and she's also on Evista 60 mg daily. Patient with no previous history of abnormal Pap smear.  Last bone density study in 2015 demonstrated the lowest T score was at the left femoral neck -1.4. The radiologist had described that in comparison with 2003 there was a 6% decrease in bone mineralization at the left femoral neck but no changes when compared to 2013. Right hip no changes reported.  Past medical history,surgical history, family history and social history were all reviewed and documented in the EPIC chart.  Gynecologic History No LMP recorded. Patient is postmenopausal. Contraception: post menopausal status Last Pap: 2014. Results were: normal Last mammogram: 2015. Results were: normal  Obstetric History OB History  Gravida Para Term Preterm AB SAB TAB Ectopic Multiple Living  3 0   2 1    0    # Outcome Date GA Lbr Len/2nd Weight Sex Delivery Anes PTL Lv  3 SAB           2 AB           1 Gravida                ROS: A ROS was performed and pertinent positives and negatives are included in the history.  GENERAL: No fevers or chills. HEENT: No change in vision, no earache, sore throat or sinus congestion. NECK:  No pain or stiffness. CARDIOVASCULAR: No chest pain or pressure. No palpitations. PULMONARY: No shortness of breath, cough or wheeze. GASTROINTESTINAL: No abdominal pain, nausea, vomiting or diarrhea, melena or bright red blood per rectum. GENITOURINARY: No urinary frequency, urgency, hesitancy or dysuria. MUSCULOSKELETAL: No joint or muscle pain, no back pain, no recent trauma. DERMATOLOGIC: No rash, no itching, no lesions. ENDOCRINE: No polyuria, polydipsia, no heat or cold intolerance. No recent change in weight. HEMATOLOGICAL: No anemia or easy bruising or bleeding. NEUROLOGIC: No headache, seizures, numbness, tingling or weakness. PSYCHIATRIC: No depression, no loss of interest in normal activity or change in sleep pattern.     Exam: chaperone present  BP 118/68 mmHg  Ht 5' 4.75" (1.645 m)  Wt 117 lb (53.071 kg)  BMI 19.61 kg/m2  Body mass index is 19.61 kg/(m^2).  General appearance : Well developed well nourished female. No acute distress HEENT: Eyes: no retinal hemorrhage or exudates,  Neck supple, trachea midline, no carotid bruits, no thyroidmegaly Lungs: Clear to auscultation, no rhonchi or wheezes, or rib retractions  Heart: Regular rate and rhythm, no murmurs or gallops Breast:Examined in sitting and supine position were symmetrical in appearance, no palpable masses or tenderness,  no skin retraction, no nipple inversion, no nipple discharge, no skin discoloration, no axillary or supraclavicular lymphadenopathy Abdomen: no palpable masses or tenderness,  no rebound or guarding Extremities: no edema or skin discoloration or tenderness  Pelvic:  Bartholin, Urethra, Skene Glands: Within normal limits             Vagina: No gross lesions or discharge, atrophic changes  Cervix: No gross lesions or discharge  Uterus  anteverted, normal size, shape and consistency, non-tender and mobile  Adnexa  Without masses or tenderness  Anus and perineum  normal   Rectovaginal  normal sphincter  tone without palpated masses or tenderness             Hemoccult cards provided     Assessment/Plan:  64 y.o. female for annual exam with past history of breast cancer doing well. Once again with counseled her on the detrimental effects of smoking. Recent chest x-ray done by her PCP. He will be doing her blood work. Patient will continue on her Evista 60 mg daily because of her history of osteopenia and chronic smoker and history of breast cancer. Patient has returned to take calcium and vitamin D twice a day. Pap smear will be done next year. Patient was reminded to do her monthly breast exam as well.   Uvaldo Rising H MD, 1:12 PM 06/10/2014

## 2014-06-11 ENCOUNTER — Encounter: Payer: Self-pay | Admitting: Internal Medicine

## 2014-06-18 ENCOUNTER — Ambulatory Visit
Admission: RE | Admit: 2014-06-18 | Discharge: 2014-06-18 | Disposition: A | Discharge status: Home / Self Care | Payer: 59 | Source: Ambulatory Visit

## 2014-06-18 DIAGNOSIS — Z9889 Other specified postprocedural states: Secondary | ICD-10-CM

## 2014-06-18 DIAGNOSIS — Z853 Personal history of malignant neoplasm of breast: Secondary | ICD-10-CM

## 2014-06-18 DIAGNOSIS — Z1231 Encounter for screening mammogram for malignant neoplasm of breast: Secondary | ICD-10-CM

## 2014-06-20 ENCOUNTER — Other Ambulatory Visit: Payer: Self-pay | Admitting: Gynecology

## 2014-06-20 DIAGNOSIS — R928 Other abnormal and inconclusive findings on diagnostic imaging of breast: Secondary | ICD-10-CM

## 2014-06-21 ENCOUNTER — Encounter: Payer: Self-pay | Admitting: Gynecology

## 2014-06-24 ENCOUNTER — Encounter: Payer: Self-pay | Admitting: *Deleted

## 2014-06-26 ENCOUNTER — Other Ambulatory Visit: Payer: Self-pay | Admitting: Gynecology

## 2014-06-26 ENCOUNTER — Ambulatory Visit
Admission: RE | Admit: 2014-06-26 | Discharge: 2014-06-26 | Disposition: A | Discharge status: Home / Self Care | Payer: 59 | Source: Ambulatory Visit | Attending: Gynecology | Admitting: Gynecology

## 2014-06-26 DIAGNOSIS — R928 Other abnormal and inconclusive findings on diagnostic imaging of breast: Secondary | ICD-10-CM

## 2014-06-27 ENCOUNTER — Telehealth (HOSPITAL_COMMUNITY): Payer: Self-pay

## 2014-06-27 ENCOUNTER — Telehealth (HOSPITAL_COMMUNITY): Payer: Self-pay | Admitting: *Deleted

## 2014-06-27 NOTE — Telephone Encounter (Signed)
Encounter complete. 

## 2014-07-02 ENCOUNTER — Ambulatory Visit (HOSPITAL_COMMUNITY)
Admission: RE | Admit: 2014-07-02 | Discharge: 2014-07-02 | Disposition: A | Discharge status: Home / Self Care | Payer: 59 | Source: Ambulatory Visit | Attending: Cardiology | Admitting: Cardiology

## 2014-07-02 ENCOUNTER — Telehealth (HOSPITAL_COMMUNITY): Payer: Self-pay | Admitting: *Deleted

## 2014-07-02 DIAGNOSIS — R072 Precordial pain: Secondary | ICD-10-CM

## 2014-07-03 LAB — EXERCISE TOLERANCE TEST
CHL CUP RESTING HR STRESS: 72 {beats}/min
CSEPEDS: 5 s
CSEPPHR: 153 {beats}/min
Estimated workload: 4.6 METS
Exercise duration (min): 3 min
MPHR: 158 {beats}/min
Percent HR: 96 %
RPE: 16

## 2014-07-04 ENCOUNTER — Encounter: Payer: Self-pay | Admitting: Gynecology

## 2014-07-04 ENCOUNTER — Ambulatory Visit
Admission: RE | Admit: 2014-07-04 | Discharge: 2014-07-04 | Disposition: A | Discharge status: Home / Self Care | Payer: 59 | Source: Ambulatory Visit | Attending: Gynecology | Admitting: Gynecology

## 2014-07-04 ENCOUNTER — Telehealth: Payer: Self-pay | Admitting: Cardiology

## 2014-07-04 ENCOUNTER — Other Ambulatory Visit: Payer: Self-pay | Admitting: Gynecology

## 2014-07-04 DIAGNOSIS — R928 Other abnormal and inconclusive findings on diagnostic imaging of breast: Secondary | ICD-10-CM

## 2014-07-04 NOTE — Telephone Encounter (Signed)
Results and recommendations reported to patient, no further questions.

## 2014-07-04 NOTE — Telephone Encounter (Signed)
Pt called in stating that she received a call from our office and she thinks that it is in regards to her results from a stress test that was done on 6/28. Please call back  Thanks

## 2014-07-05 ENCOUNTER — Telehealth: Payer: Self-pay

## 2014-07-05 DIAGNOSIS — C50912 Malignant neoplasm of unspecified site of left female breast: Secondary | ICD-10-CM

## 2014-07-05 NOTE — Telephone Encounter (Signed)
Patient called and I let her know that Tanya [name] is working on the referral.

## 2014-07-05 NOTE — Telephone Encounter (Signed)
Leigh with [facility] called and confirmed that breast biopsy of left breast was cancerous. Pt has a hx of breast cancer in 1999. Pt has been informed of results by the [facility], However, pt has [employer] and they require a referral from PCP for a Education officer, environmental.  Referral has been entered. [facility] is calling pt back to let them know that we have the referral in.

## 2014-07-09 ENCOUNTER — Telehealth: Payer: Self-pay | Admitting: Internal Medicine

## 2014-07-09 NOTE — Telephone Encounter (Signed)
Referral re-faxed to [facility].

## 2014-07-09 NOTE — Telephone Encounter (Signed)
Joyceann from General Surgery from [facility] stated she havent recieve the referral, please advise  Fax# [phone]  Phone # [phone]

## 2014-07-16 ENCOUNTER — Encounter: Payer: Self-pay | Admitting: Gynecology

## 2014-08-05 HISTORY — PX: MASTECTOMY: SHX3

## 2014-08-15 ENCOUNTER — Encounter: Payer: Self-pay | Admitting: Gynecology

## 2014-08-19 ENCOUNTER — Telehealth: Payer: Self-pay | Admitting: Internal Medicine

## 2014-08-19 DIAGNOSIS — C50912 Malignant neoplasm of unspecified site of left female breast: Secondary | ICD-10-CM

## 2014-08-19 NOTE — Telephone Encounter (Signed)
Is requesting referral to [facility] with Dr. Chauncey Cruel (1st choice) or Dr. Elder Negus (2nd choice) for breast cancer.

## 2014-08-19 NOTE — Telephone Encounter (Signed)
Referral pended with dx code for PCP review.

## 2014-08-20 NOTE — Telephone Encounter (Signed)
Noted and agree. Thanks.

## 2014-08-29 ENCOUNTER — Telehealth: Payer: Self-pay | Admitting: *Deleted

## 2014-08-29 NOTE — Telephone Encounter (Signed)
Received referral Auth fax from Ophelia Charter at pt's PCP.  She gave me Referral Auth # [id number].  Good for 08/26/14 thru 02/26/15.  Sent Fax to HIM to scan.

## 2014-09-02 ENCOUNTER — Other Ambulatory Visit: Payer: Self-pay | Admitting: *Deleted

## 2014-09-02 ENCOUNTER — Telehealth: Payer: Self-pay | Admitting: *Deleted

## 2014-09-02 DIAGNOSIS — C50912 Malignant neoplasm of unspecified site of left female breast: Secondary | ICD-10-CM

## 2014-09-02 NOTE — Telephone Encounter (Signed)
Received records from [facility].  Placed a copy in Dr. [name] box and took one to HIM to scan.

## 2014-09-03 ENCOUNTER — Encounter: Payer: Self-pay | Admitting: Oncology

## 2014-09-03 ENCOUNTER — Ambulatory Visit (HOSPITAL_BASED_OUTPATIENT_CLINIC_OR_DEPARTMENT_OTHER): Payer: 59 | Admitting: Oncology

## 2014-09-03 ENCOUNTER — Ambulatory Visit: Payer: 59

## 2014-09-03 ENCOUNTER — Other Ambulatory Visit (HOSPITAL_BASED_OUTPATIENT_CLINIC_OR_DEPARTMENT_OTHER): Payer: 59

## 2014-09-03 VITALS — BP 110/59 | HR 89 | Temp 98.5°F | Resp 18 | Ht 64.75 in | Wt 113.8 lb

## 2014-09-03 DIAGNOSIS — F17200 Nicotine dependence, unspecified, uncomplicated: Secondary | ICD-10-CM

## 2014-09-03 DIAGNOSIS — Z853 Personal history of malignant neoplasm of breast: Secondary | ICD-10-CM | POA: Insufficient documentation

## 2014-09-03 DIAGNOSIS — C50912 Malignant neoplasm of unspecified site of left female breast: Secondary | ICD-10-CM

## 2014-09-03 DIAGNOSIS — Z720 Tobacco use: Secondary | ICD-10-CM | POA: Diagnosis not present

## 2014-09-03 LAB — COMPREHENSIVE METABOLIC PANEL (CC13)
ALT: 14 U/L (ref 0–55)
AST: 17 U/L (ref 5–34)
Albumin: 4.1 g/dL (ref 3.5–5.0)
Alkaline Phosphatase: 43 U/L (ref 40–150)
Anion Gap: 8 mEq/L (ref 3–11)
BUN: 19.3 mg/dL (ref 7.0–26.0)
CHLORIDE: 102 meq/L (ref 98–109)
CO2: 31 meq/L — AB (ref 22–29)
Calcium: 9.6 mg/dL (ref 8.4–10.4)
Creatinine: 0.8 mg/dL (ref 0.6–1.1)
EGFR: 76 mL/min/{1.73_m2} — AB (ref >90)
GLUCOSE: 120 mg/dL (ref 70–140)
Potassium: 4.1 mEq/L (ref 3.5–5.1)
Sodium: 140 mEq/L (ref 136–145)
Total Bilirubin: 0.32 mg/dL (ref 0.20–1.20)
Total Protein: 6.9 g/dL (ref 6.4–8.3)

## 2014-09-03 LAB — CBC WITH DIFFERENTIAL/PLATELET
BASO%: 0.3 % (ref 0.0–2.0)
Basophils Absolute: 0 10*3/uL (ref 0.0–0.1)
EOS%: 1.3 % (ref 0.0–7.0)
Eosinophils Absolute: 0.1 10*3/uL (ref 0.0–0.5)
HCT: 42.1 % (ref 34.8–46.6)
HGB: 14.6 g/dL (ref 11.6–15.9)
LYMPH%: 35.5 % (ref 14.0–49.7)
MCH: 34.2 pg — ABNORMAL HIGH (ref 25.1–34.0)
MCHC: 34.7 g/dL (ref 31.5–36.0)
MCV: 98.6 fL (ref 79.5–101.0)
MONO#: 0.5 10*3/uL (ref 0.1–0.9)
MONO%: 8 % (ref 0.0–14.0)
NEUT%: 54.9 % (ref 38.4–76.8)
NEUTROS ABS: 3.4 10*3/uL (ref 1.5–6.5)
Platelets: 157 10*3/uL (ref 145–400)
RBC: 4.27 10*6/uL (ref 3.70–5.45)
RDW: 13 % (ref 11.2–14.5)
WBC: 6.1 10*3/uL (ref 3.9–10.3)
lymph#: 2.2 10*3/uL (ref 0.9–3.3)

## 2014-09-03 NOTE — Progress Notes (Signed)
[place]  [phone] [phone]     ID: Tanya [name] DOB: July 05, 1950  MR#: [id number]  EOF#:[id number]  Patient Care Team: Tanya Clack, MD as PCP - General Tanya Dragon, MD (Gastroenterology) Tanya Mass, MD (Obstetrics and Gynecology) Tanya Cruel, MD as Consulting Physician (Oncology) Tanya Ok, MD as Referring Physician (Surgery) PCP: Tanya Grant, MD OTHER MD: Tanya Breeding MD, Tanya Luria MD  CHIEF COMPLAINT: Estrogen receptor positive breast cancer  CURRENT TREATMENT: Paclitaxel plus trastuzumab, to be followed by anastrozole   BREAST CANCER HISTORY: At the age of 71, the patient underwent lumpectomy and sentinel lymph node biopsy for a left sided T1(mic) N0, stage IA invasive breast cancer. She completed whole breast irradiation and received tamoxifen for 5 years. She has continued on raloxifene.  More recently, screening mammography at the breast Center with tomography 06/19/2014 showed calcifications in the left breast, which were further evaluated with diagnostic left mammography 06/26/2014. Breast density was category C. There was a 5 mm group of heterogeneous calcifications in the outer left breast. This was biopsied 07/04/2014, and showed ([id number]) invasive and in situ ductal carcinoma, grade 2. There was a minimal amount of invasive tumor, but it did allow for prognostic panel, which showed the cancer to be estrogen receptor 90% positive, progesterone receptor 80% positive, both with strong staining intensity, with an MIB-1 of 80%, and HER-2 amplified, with a signals ratio of 8.09, the number per cell being 8.90.  The patient then was evaluated at [facility] by Dr. Elisha [name] and, given the prior history of radiation, mastectomy with repeat sentinel lymph node sampling was suggested. This was performed 08/08/2014, and showed ([id number]) invasive ductal carcinoma, grade 1, measuring 0.3 cm.  Margins were negative. All 3 sentinel lymph nodes sampled were negative. Repeat prognostic panel was obtained on the DCIS only, and found the tumor to be estrogen receptor positive at 65% and progesterone receptor positive at 75%.  The patient's subsequent history is as detailed below.   INTERVAL HISTORY: [name] was evaluated at the breast clinic 09/03/2014  REVIEW OF SYSTEMS: She did remarkably well with her surgery, with very little pain, no significant problems with bleeding, or fever. She does have some numbness involving the medial aspect of the left upper extremity as well as the axilla. At this point she is not contemplating reconstruction. Otherwise she has minimal fatigue, has some arthritic pain here in there which is not more intense or persistent than before, and does continue to smoke. A detailed review of systems today was otherwise stable.  PAST MEDICAL HISTORY: Past Medical History  Diagnosis Date  . COPD, mild   . Smoker   . Osteopenia     DEXA 10/27/11: -1.4, no change from 10/2009  . Shingles   . Breast cancer     Radiation and lumpectomy    PAST SURGICAL HISTORY: Past Surgical History  Procedure Laterality Date  . Tonsillectomy and adenoidectomy    . Dilation and evacuation    . Breast lumpectomy    . Breast biopsy      FAMILY HISTORY Family History  Problem Relation Age of Onset  . CAD Mother     stent older age  . Hypertension Mother   . Breast cancer Mother 11  . Cancer Mother     UTERINE AND SKIN  . Heart disease Paternal Grandfather   . Hypertension Father   . Hearing loss Maternal Grandfather   . Heart disease Father  67    CHF    the patient's father died with Alzheimer's disease at age 57. The patient's mother was diagnosed with breast cancer at age 67. She died from C. difficile colitis at age 40. The patient has one brother, one sister. There is no history of breast or ovarian cancer in the immediate family.  GYNECOLOGIC HISTORY:  No LMP  recorded. Patient is postmenopausal. Menarche age 83, the patient is GX P0. She underwent menopause approximately the year 2000. She did not take hormone replacement. 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 Her husband, Tanya [name], used to be in Press photographer (Set designer) but now has had 2 back fusions and is disabled. At home is just the 2 of them plus her dog Tanya [name]   ADVANCED DIRECTIVES: In place   HEALTH MAINTENANCE: Social History  Substance Use Topics  . Smoking status: Current Every Day Smoker -- 1.00 packs/day for 30 years    Types: Cigarettes  . Smokeless tobacco: Never Used     Comment: Trying to smoke less.   . Alcohol Use: 8.4 oz/week    14 Standard drinks or equivalent per week     Comment: 5 DRINKS A WEEK     Colonoscopy: November 2009  PAP:  Bone density: November 2015:  Lipid panel:  Allergies  Allergen Reactions  . Codeine     REACTION: Rash  . Tetanus Toxoid     REACTION: Shock    Current Outpatient Prescriptions  Medication Sig Dispense Refill  . Cholecalciferol (VITAMIN D-1000 Tanya [name] ST) 1000 UNITS tablet Take 1,000 Units by mouth.    . nitroGLYCERIN (NITROSTAT) 0.4 MG SL tablet Place 1 tablet (0.4 mg total) under the tongue every 5 (five) minutes as needed for chest pain. 25 tablet 3  . raloxifene (EVISTA) 60 MG tablet Take 1 tablet (60 mg total) by mouth daily. 30 tablet 11   No current facility-administered medications for this visit.    OBJECTIVE: Middle-aged white woman who appears stated age 64 Vitals:   09/03/14 1602  BP: 110/59  Pulse: 89  Temp: 98.5 F (36.9 C)  Resp: 18     Body [name] index is 19.08 kg/(m^2).    ECOG FS:0 - Asymptomatic  Ocular: Sclerae unicteric, pupils equal, round and reactive to light Ear-nose-throat: Oropharynx clear and moist Lymphatic: No cervical or supraclavicular adenopathy Lungs no  rales or rhonchi, good excursion bilaterally Heart regular rate and rhythm, no murmur appreciated Abd soft, nontender, positive bowel sounds MSK no focal spinal tenderness, no joint edema Neuro: non-focal, well-oriented, appropriate affect Breasts: The right breast is unremarkable. The left breast is status post mastectomy. The incision is healing very nicely. There is no evidence of local recurrence. Both axillae are benign.   LAB RESULTS:  CMP     Component Value Date/Time   NA 140 09/03/2014 1540   NA 138 04/03/2014 1059   K 4.1 09/03/2014 1540   K 4.2 04/03/2014 1059   CL 101 04/03/2014 1059   CO2 31* 09/03/2014 1540   CO2 33* 04/03/2014 1059   GLUCOSE 120 09/03/2014 1540   GLUCOSE 97 04/03/2014 1059   BUN 19.3 09/03/2014 1540   BUN 15 04/03/2014 1059   CREATININE 0.8 09/03/2014 1540   CREATININE 0.73 04/03/2014 1059   CREATININE 0.66 06/08/2013 1016   CALCIUM 9.6 09/03/2014 1540   CALCIUM 9.9 04/03/2014 1059  PROT 6.9 09/03/2014 1540   PROT 7.4 04/03/2014 1059   ALBUMIN 4.1 09/03/2014 1540   ALBUMIN 4.5 04/03/2014 1059   AST 17 09/03/2014 1540   AST 20 04/03/2014 1059   ALT 14 09/03/2014 1540   ALT 13 04/03/2014 1059   ALKPHOS 43 09/03/2014 1540   ALKPHOS 40 04/03/2014 1059   BILITOT 0.32 09/03/2014 1540   BILITOT 0.5 04/03/2014 1059    INo results found for: SPEP, UPEP  Lab Results  Component Value Date   WBC 6.1 09/03/2014   NEUTROABS 3.4 09/03/2014   HGB 14.6 09/03/2014   HCT 42.1 09/03/2014   MCV 98.6 09/03/2014   PLT 157 09/03/2014      Chemistry      Component Value Date/Time   NA 140 09/03/2014 1540   NA 138 04/03/2014 1059   K 4.1 09/03/2014 1540   K 4.2 04/03/2014 1059   CL 101 04/03/2014 1059   CO2 31* 09/03/2014 1540   CO2 33* 04/03/2014 1059   BUN 19.3 09/03/2014 1540   BUN 15 04/03/2014 1059   CREATININE 0.8 09/03/2014 1540   CREATININE 0.73 04/03/2014 1059   CREATININE 0.66 06/08/2013 1016      Component Value Date/Time    CALCIUM 9.6 09/03/2014 1540   CALCIUM 9.9 04/03/2014 1059   ALKPHOS 43 09/03/2014 1540   ALKPHOS 40 04/03/2014 1059   AST 17 09/03/2014 1540   AST 20 04/03/2014 1059   ALT 14 09/03/2014 1540   ALT 13 04/03/2014 1059   BILITOT 0.32 09/03/2014 1540   BILITOT 0.5 04/03/2014 1059       No results found for: LABCA2  No components found for: LABCA125  No results for input(s): INR in the last 168 hours.  Urinalysis    Component Value Date/Time   COLORURINE YELLOW 04/03/2014 1059   APPEARANCEUR CLEAR 04/03/2014 1059   LABSPEC <=1.005* 04/03/2014 1059   PHURINE 7.0 04/03/2014 1059   GLUCOSEU NEGATIVE 04/03/2014 1059   GLUCOSEU NEG 06/08/2013 1016   HGBUR TRACE-INTACT* 04/03/2014 1059   BILIRUBINUR NEGATIVE 04/03/2014 1059   KETONESUR NEGATIVE 04/03/2014 1059   PROTEINUR NEG 06/08/2013 1016   UROBILINOGEN 0.2 04/03/2014 1059   NITRITE NEGATIVE 04/03/2014 1059   LEUKOCYTESUR NEGATIVE 04/03/2014 1059    STUDIES: No results found. outside studies reviewed  ASSESSMENT: 64 y.o. [place] woman  (1) status post left lumpectomy and sentinel lymph node sampling 1999 for a T1(mic) N0, stage IA invasive breast cancer,  (a) status post adjuvant radiation  (b) status post tamoxifen for 5 years  (2) status post left breast biopsy 07/04/2014 for a clinical T1a N0, stage IA invasive ductal carcinoma, triple positive, with an MIB-1 of 80%  (3) status post left mastectomy and sentinel lymph node sampling 08/06/2014 for a pT1a pN0, stage IA invasive ductal carcinoma, grade 2, with negative margins. There was insufficient tissue to repeat HER-2 testing  (4) adjuvant chemotherapy will consist of paclitaxel weekly 12 with trastuzumab given every 3 weeks, continuing the trastuzumab alone to complete 1 year  (5) anastrozole to start once Taxol chemotherapy completed  (a) osteopenia, with a T score of -1.4 on bone density scan 11/12/2013  (6) genetics testing pending  (7) tobacco abuse: The  patient has been strongly advised to quit  PLAN: We spent the better part of today's hour-long appointment discussing the biology of breast cancer in general, and the specifics of the patient's tumor in particular. Shakiyla's situation is complex. In general, for T1a tumors, I recommend either  anti-estrogens alone or observation. The risk of recurrence is generally so low that there is no need for chemotherapy.  This situation is different. First this is a HER-2 positive tumor and HER-2 positivity interferes with antiestrogen effectiveness. This is why this cancer was able to develop right through the raloxifene the patient was taking. Second, the metastatic potential of HER-2 positive tumors is greater then that of other cancers.  If only a few cells from this tumor have dislodged and are now in her liver, bones, or lungs, they are likely to grow there and we would end up with incurable stage IV disease in a relatively short time.  For that reason in this case my recommendation is that we go ahead and treat and the minimum treatment and I can offer her for which we have level I evidence is Taxol weekly 12 given together with trastuzumab every 3 weeks, to be followed by trastuzumab alone to complete a year.  This means that when she starts the anastrozole, which she will once she is done with the Taxol, anastrozole will be able to work as it normally does, since there will not be "cross talk" from an unblocked HER-2 receptor.  Note that these recommendations are in line with NCCN guidelines ( BINV-5).  Today we discussed the possible toxicities, side effects and complications of paclitaxel and trastuzumab. She will be set up for echocardiogram and 2, to "chemotherapy school". At this point she would prefer not to have a port inserted.  She is going to see me again on September 9 and we are planning to start her chemotherapy on September 12.  In addition, she qualifies for genetics testing and I'm  setting that up separately.  We went into the effects of smoking as regards to chemotherapy and it would be highly advantageous if she could quit smoking at least for the next 3 months. In addition I have suggested that she decreased a call intake from 2 drinks daily to no more than one drink a day.  The patient has a good understanding of the overall plan. She agrees with it. She knows the goal of treatment in her case is cure. She will call with any problems that may develop before her next visit here.  Tanya Cruel, MD   09/03/2014 5:30 PM Medical Oncology and Hematology [facility] [address] [place], [place] [address] Tel. [phone]    Fax. [phone]

## 2014-09-03 NOTE — Progress Notes (Signed)
Checked in new br ca patient. Pt given breast packet as requested in notes to complete. Pt given Lenise's card for any financial questions or concerns if needed.

## 2014-09-04 ENCOUNTER — Telehealth: Payer: Self-pay | Admitting: Oncology

## 2014-09-04 ENCOUNTER — Telehealth: Payer: Self-pay | Admitting: *Deleted

## 2014-09-04 NOTE — Telephone Encounter (Signed)
Per staff message and POF I have scheduled appts. Advised scheduler of appts. JMW  

## 2014-09-04 NOTE — Telephone Encounter (Signed)
Called patient with upcoming appointments,echo to [name] for precert,patient declines genetics at this time,email to [name] w to help with 9/12 chemo and patient will get a printout tomorrow  Tanya [name]

## 2014-09-05 ENCOUNTER — Other Ambulatory Visit: Payer: 59

## 2014-09-05 ENCOUNTER — Other Ambulatory Visit: Payer: Self-pay | Admitting: *Deleted

## 2014-09-11 ENCOUNTER — Telehealth: Payer: Self-pay | Admitting: Oncology

## 2014-09-11 NOTE — Telephone Encounter (Signed)
s.w. pt and advised on time change....pt ok and aeware

## 2014-09-13 ENCOUNTER — Telehealth: Payer: Self-pay | Admitting: Oncology

## 2014-09-13 ENCOUNTER — Telehealth: Payer: Self-pay | Admitting: *Deleted

## 2014-09-13 ENCOUNTER — Ambulatory Visit (HOSPITAL_BASED_OUTPATIENT_CLINIC_OR_DEPARTMENT_OTHER): Payer: 59 | Admitting: Oncology

## 2014-09-13 VITALS — BP 114/61 | HR 93 | Temp 97.4°F | Resp 18 | Ht 76.75 in | Wt 115.7 lb

## 2014-09-13 DIAGNOSIS — C50912 Malignant neoplasm of unspecified site of left female breast: Secondary | ICD-10-CM | POA: Diagnosis not present

## 2014-09-13 DIAGNOSIS — Z720 Tobacco use: Secondary | ICD-10-CM | POA: Diagnosis not present

## 2014-09-13 MED ORDER — ONDANSETRON HCL 8 MG PO TABS: 8.0000 mg | ORAL_TABLET | Freq: Two times a day (BID) | ORAL | Status: DC | PRN

## 2014-09-13 MED ORDER — PROCHLORPERAZINE MALEATE 10 MG PO TABS: 10.0000 mg | ORAL_TABLET | Freq: Four times a day (QID) | ORAL | Status: DC | PRN

## 2014-09-13 MED ORDER — LORAZEPAM 0.5 MG PO TABS: 0.5000 mg | ORAL_TABLET | Freq: Three times a day (TID) | ORAL | Status: DC

## 2014-09-13 NOTE — Telephone Encounter (Signed)
Per staff message and POF I have scheduled appts. Advised scheduler of appts. JMW  

## 2014-09-13 NOTE — Telephone Encounter (Signed)
All appointments readjusted as dr gm added multi appointments,patient will get a new schedule 9/12

## 2014-09-13 NOTE — Progress Notes (Signed)
Tanya [name]  [phone] Fax:([phone]) [phone]     ID: Tanya [name] DOB: 1950/10/12  MR#: [id number]  TML#:[id number]  Patient Care Team: Rowe Clack, MD as PCP - General Tanya Dragon, MD (Gastroenterology) [name], MD (Obstetrics and Gynecology) Chauncey Cruel, MD as Consulting Physician (Oncology) Tanya Ok, MD as Referring Physician (Surgery) PCP: Tanya Grant, MD OTHER MD: Tanya Breeding MD, Tanya Luria MD  CHIEF COMPLAINT: Estrogen receptor positive breast cancer  CURRENT TREATMENT: Paclitaxel plus trastuzumab, to be followed by anastrozole   BREAST CANCER HISTORY: From the original intake note:  At the age of 64, the patient underwent lumpectomy and sentinel lymph node biopsy for a left sided T1(mic) N0, stage IA invasive breast cancer. She completed whole breast irradiation and received tamoxifen for 5 years. She has continued on raloxifene.  More recently, screening mammography at the breast Center with tomography 06/19/2014 showed calcifications in the left breast, which were further evaluated with diagnostic left mammography 06/26/2014. Breast density was category C. There was a 5 mm group of heterogeneous calcifications in the outer left breast. This was biopsied 07/04/2014, and showed ([id number]) invasive and in situ ductal carcinoma, grade 2. There was a minimal amount of invasive tumor, but it did allow for prognostic panel, which showed the cancer to be estrogen receptor 90% positive, progesterone receptor 80% positive, both with strong staining intensity, with an MIB-1 of 80%, and HER-2 amplified, with a signals ratio of 8.09, the number per cell being 8.90.  The patient then was evaluated at [facility] by Dr. Elisha Headland and, given the prior history of radiation, mastectomy with repeat sentinel lymph node sampling was suggested. This was performed 08/08/2014, and showed ([id number]) invasive ductal  carcinoma, grade 1, measuring 0.3 cm. Margins were negative. All 3 sentinel lymph nodes sampled were negative. Repeat prognostic panel was obtained on the DCIS only, and found the tumor to be estrogen receptor positive at 65% and progesterone receptor positive at 75%.  The patient's subsequent history is as detailed below.   INTERVAL HISTORY: [name] returns today for follow-up of her early stage breast cancer. She was supposed to have had an echocardiogram this week but for some reason that was not scheduled. Those orders have been reentered and I have asked her to call if she has not heard by Tuesday of next week with a specific date. This means we are not be able to start the trastuzumab this coming week, but of course we can start the Taxol and that is the plan. She did come to chemotherapy school and is faithfully following all the suggestions made there including going to her dentist, which she did yesterday. Recall that she has opted against a port. The nurses do feel she has "good veins". She knows that if axis becomes a problem placing a port can be done readily  REVIEW OF SYSTEMS: She has occasional discomfort at the surgical site, but this is intermittent and mild. She takes Motrin for that as well as for her occasional arthritic discomfort. A detailed review of systems today was otherwise entirely stable  PAST MEDICAL HISTORY: Past Medical History  Diagnosis Date  . COPD, mild   . Smoker   . Osteopenia     DEXA 10/27/11: -1.4, no change from 10/2009  . Shingles   . Breast cancer     Radiation and lumpectomy    PAST SURGICAL HISTORY: Past Surgical History  Procedure Laterality Date  . Tonsillectomy and  adenoidectomy    . Dilation and evacuation    . Breast lumpectomy    . Breast biopsy      FAMILY HISTORY Family History  Problem Relation Age of Onset  . CAD Mother     stent older age  . Hypertension Mother   . Breast cancer Mother 25  . Cancer Mother     UTERINE AND  SKIN  . Heart disease Paternal Grandfather   . Hypertension Father   . Hearing loss Maternal Grandfather   . Heart disease Father 54    CHF    the patient's father died with Alzheimer's disease at age 37. The patient's mother was diagnosed with breast cancer at age 40. She died from C. difficile colitis at age 2. The patient has one brother, one sister. There is no history of breast or ovarian cancer in the immediate family.  GYNECOLOGIC HISTORY:  No LMP recorded. Patient is postmenopausal. Menarche age 36, the patient is GX P0. She underwent menopause approximately the year 2000. She did not take hormone replacement. 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 Her husband, [name], used to be in Press photographer (Set designer) but now has had 2 back fusions and is disabled. At home is just the 2 of them plus her dog Max   ADVANCED DIRECTIVES: In place   HEALTH MAINTENANCE: Social History  Substance Use Topics  . Smoking status: Current Every Day Smoker -- 1.00 packs/day for 30 years    Types: Cigarettes  . Smokeless tobacco: Never Used     Comment: Trying to smoke less.   . Alcohol Use: 8.4 oz/week    14 Standard drinks or equivalent per week     Comment: 5 DRINKS A WEEK     Colonoscopy: November 2009  PAP:  Bone density: November 2015:  Lipid panel:  Allergies  Allergen Reactions  . Codeine     REACTION: Rash  . Tetanus Toxoid     REACTION: Shock    Current Outpatient Prescriptions  Medication Sig Dispense Refill  . Calcium Carbonate-Vitamin D (CALTRATE 600+D) 600-400 MG-UNIT per tablet Take 1 tablet by mouth daily.    . Cholecalciferol (VITAMIN D-1000 MAX ST) 1000 UNITS tablet Take 1,000 Units by mouth.    Marland Kitchen ibuprofen (MOTRIN IB) 200 MG tablet Take 1 tablet (200 mg total) by mouth every 6 (six) hours as needed. 30 tablet 0  . LORazepam (ATIVAN)  0.5 MG tablet Take 1 tablet (0.5 mg total) by mouth every 8 (eight) hours. 30 tablet 0  . ondansetron (ZOFRAN) 8 MG tablet Take 1 tablet (8 mg total) by mouth 2 (two) times daily as needed for nausea or vomiting. 20 tablet 1  . prochlorperazine (COMPAZINE) 10 MG tablet Take 1 tablet (10 mg total) by mouth every 6 (six) hours as needed for nausea or vomiting. 30 tablet 1   No current facility-administered medications for this visit.    OBJECTIVE: Middle-aged white woman in no acute distress Filed Vitals:   09/13/14 0847  BP: 114/61  Pulse: 93  Temp: 97.4 F (36.3 C)  Resp: 18     Body mass index is 13.82 kg/(m^2).    ECOG FS:0 - Asymptomatic  Sclerae unicteric, pupils round and equal Oropharynx clear and moist-- no thrush or other lesions No cervical or supraclavicular adenopathy Lungs no rales or rhonchi Heart regular rate and rhythm  Abd soft, nontender, positive bowel sounds MSK no focal spinal tenderness, no upper extremity lymphedema Neuro: nonfocal, well oriented, appropriate affect Breasts: Deferred    LAB RESULTS:  CMP     Component Value Date/Time   NA 140 09/03/2014 1540   NA 138 04/03/2014 1059   K 4.1 09/03/2014 1540   K 4.2 04/03/2014 1059   CL 101 04/03/2014 1059   CO2 31* 09/03/2014 1540   CO2 33* 04/03/2014 1059   GLUCOSE 120 09/03/2014 1540   GLUCOSE 97 04/03/2014 1059   BUN 19.3 09/03/2014 1540   BUN 15 04/03/2014 1059   CREATININE 0.8 09/03/2014 1540   CREATININE 0.73 04/03/2014 1059   CREATININE 0.66 06/08/2013 1016   CALCIUM 9.6 09/03/2014 1540   CALCIUM 9.9 04/03/2014 1059   PROT 6.9 09/03/2014 1540   PROT 7.4 04/03/2014 1059   ALBUMIN 4.1 09/03/2014 1540   ALBUMIN 4.5 04/03/2014 1059   AST 17 09/03/2014 1540   AST 20 04/03/2014 1059   ALT 14 09/03/2014 1540   ALT 13 04/03/2014 1059   ALKPHOS 43 09/03/2014 1540   ALKPHOS 40 04/03/2014 1059   BILITOT 0.32 09/03/2014 1540   BILITOT 0.5 04/03/2014 1059    INo results found for: SPEP,  UPEP  Lab Results  Component Value Date   WBC 6.1 09/03/2014   NEUTROABS 3.4 09/03/2014   HGB 14.6 09/03/2014   HCT 42.1 09/03/2014   MCV 98.6 09/03/2014   PLT 157 09/03/2014      Chemistry      Component Value Date/Time   NA 140 09/03/2014 1540   NA 138 04/03/2014 1059   K 4.1 09/03/2014 1540   K 4.2 04/03/2014 1059   CL 101 04/03/2014 1059   CO2 31* 09/03/2014 1540   CO2 33* 04/03/2014 1059   BUN 19.3 09/03/2014 1540   BUN 15 04/03/2014 1059   CREATININE 0.8 09/03/2014 1540   CREATININE 0.73 04/03/2014 1059   CREATININE 0.66 06/08/2013 1016      Component Value Date/Time   CALCIUM 9.6 09/03/2014 1540   CALCIUM 9.9 04/03/2014 1059   ALKPHOS 43 09/03/2014 1540   ALKPHOS 40 04/03/2014 1059   AST 17 09/03/2014 1540   AST 20 04/03/2014 1059   ALT 14 09/03/2014 1540   ALT 13 04/03/2014 1059   BILITOT 0.32 09/03/2014 1540   BILITOT 0.5 04/03/2014 1059       No results found for: LABCA2  No components found for: LABCA125  No results for input(s): INR in the last 168 hours.  Urinalysis    Component Value Date/Time   COLORURINE YELLOW 04/03/2014 1059   APPEARANCEUR CLEAR 04/03/2014 1059   LABSPEC <=1.005* 04/03/2014 1059   PHURINE 7.0 04/03/2014 1059   GLUCOSEU NEGATIVE 04/03/2014 1059   GLUCOSEU NEG 06/08/2013 1016   HGBUR TRACE-INTACT* 04/03/2014 1059   BILIRUBINUR NEGATIVE 04/03/2014 1059   KETONESUR NEGATIVE 04/03/2014 1059   PROTEINUR NEG 06/08/2013 1016   UROBILINOGEN 0.2 04/03/2014 1059   NITRITE NEGATIVE 04/03/2014 1059   LEUKOCYTESUR NEGATIVE 04/03/2014 1059    STUDIES: No results found.   ASSESSMENT: 64 y.o. [place] woman  (1) status post left lumpectomy and sentinel lymph node sampling 1999 for a T1(mic) N0, stage IA invasive breast cancer,  (a) status post adjuvant radiation  (b) status post tamoxifen for 5 years  (2) status post left breast biopsy 07/04/2014 for a clinical T1a N0, stage IA invasive ductal carcinoma, triple positive,  with an MIB-1 of 80%  (3) status post left  mastectomy and sentinel lymph node sampling 08/06/2014 for a pT1a pN0, stage IA invasive ductal carcinoma, grade 2, with negative margins. There was insufficient tissue to repeat HER-2 testing  (4) adjuvant chemotherapy will consist of paclitaxel weekly 12 with trastuzumab given every 3 weeks, continuing the trastuzumab alone to complete 1 year  (5) anastrozole to start once Taxol chemotherapy completed  (a) osteopenia, with a T score of -1.4 on bone density scan 11/12/2013  (6) genetics testing pending  (7) tobacco abuse: The patient has been strongly advised to quit  PLAN: I spent approximately 50 minutes with Pamala Hurry today reviewing her situation and treatment plan.  Unfortunately even though the echo order was entered it was not scheduled and she has not had that. Accordingly we are going to have to postpone the trastuzumab treatment for at least a week, until that is done. She can however start the paclitaxel next week as scheduled.  We had originally discussed the possibility of using pertuzumab. That is not going to be possible. That is restricted to the larger tumors or node-positive tumors. Accordingly her anti-HER-2 treatment will be trastuzumab alone.  Today we reviewed in more detail the possible toxicities, side effects and complications of paclitaxel. She also reviewed these at the "therapy school" visit. She understands she may lose her hair or it may thin. She is going to investigate wig possibilities today. We also talked about concerns regarding peripheral neuropathy issues.  For nausea she will have ondansetron which she will take at twice daily for 2 days beginning the evening of chemotherapy. She will also have prochlorperazine every 6 hours as needed. If this works well for her and if she doesn't get headaches from the ondansetron at that will be the plan. Otherwise we will omit the ondansetron, and lean on the prochlorperazine  plus or Tanya dexamethasone.  I also wrote her for lorazepam since she is likely to have some insomnia problems on treatment day he comes of pretreatment steroids.  Her insurance is changing and her pharmacy has changed. All that was straightened out today as well.  The plan then is to start with Taxol alone on September 12, and add the Herceptin within next treatment, on September 19. She will see me on September 19 to make sure everything went well with the first Taxol treatment.  She has a good understanding of this plan. She agrees with it. She knows a goal of treatment in her case is cure. She will call with any problems that may develop before her next visit here.   Chauncey Cruel, MD   09/13/2014 9:57 AM Medical Oncology and Hematology [facility] [address] [place], [place] [address] Tel. [phone]    Fax. [phone]

## 2014-09-13 NOTE — Telephone Encounter (Signed)
Appointments made and avs printed for patient °

## 2014-09-14 NOTE — Progress Notes (Signed)
[facility]  Telephone:([phone]) [phone] Fax:(336) [phone]     ID: Tanya [name] DOB: 29-Jun-1950  MR#: [id number]  TKZ#:[id number]  Patient Care Team: Rowe Clack, MD as PCP - General [name], MD (Gastroenterology) [name], MD (Obstetrics and Gynecology) Chauncey Cruel, MD as Consulting Physician (Oncology) [name], MD as Referring Physician (Surgery) PCP: Gwendolyn Grant, MD OTHER MD: [name] MD, [name] MD  CHIEF COMPLAINT: Estrogen receptor positive breast cancer  CURRENT TREATMENT: Paclitaxel plus trastuzumab, to be followed by anastrozole   BREAST CANCER HISTORY: From the original intake note:  At the age of 72, the patient underwent lumpectomy and sentinel lymph node biopsy for a left sided T1(mic) N0, stage IA invasive breast cancer. She completed whole breast irradiation and received tamoxifen for 5 years. She has continued on raloxifene.  More recently, screening mammography at the breast Center with tomography 06/19/2014 showed calcifications in the left breast, which were further evaluated with diagnostic left mammography 06/26/2014. Breast density was category C. There was a 5 mm group of heterogeneous calcifications in the outer left breast. This was biopsied 07/04/2014, and showed ([id number]) invasive and in situ ductal carcinoma, grade 2. There was a minimal amount of invasive tumor, but it did allow for prognostic panel, which showed the cancer to be estrogen receptor 90% positive, progesterone receptor 80% positive, both with strong staining intensity, with an MIB-1 of 80%, and HER-2 amplified, with a signals ratio of 8.09, the number per cell being 8.90.  The patient then was evaluated at [facility] by Dr. Elisha Headland and, given the prior history of radiation, mastectomy with repeat sentinel lymph node sampling was suggested. This was performed 08/08/2014, and showed ([id number]) invasive ductal  carcinoma, grade 1, measuring 0.3 cm. Margins were negative. All 3 sentinel lymph nodes sampled were negative. Repeat prognostic panel was obtained on the DCIS only, and found the tumor to be estrogen receptor positive at 65% and progesterone receptor positive at 75%.  The patient's subsequent history is as detailed below.   INTERVAL HISTORY: Tanya [name] returns today for follow-up of her breast cancer. She was supposed to have had an echocardiogram but for some reason that was not done. We are checking with our scheduling people. Also the appointment this morning was read as canceled for [place] in fact she did show and she was seen today. She did make it to chemotherapy school and learned more about the possible side effects, toxicities and complications of her treatment. She is here today to discuss supportive therapy and specifically anti-emetics  REVIEW OF SYSTEMS: A detailed review of systems today is otherwise stable  PAST MEDICAL HISTORY: Past Medical History  Diagnosis Date  . COPD, mild   . Smoker   . Osteopenia     DEXA 10/27/11: -1.4, no change from 10/2009  . Shingles   . Breast cancer     Radiation and lumpectomy    PAST SURGICAL HISTORY: Past Surgical History  Procedure Laterality Date  . Tonsillectomy and adenoidectomy    . Dilation and evacuation    . Breast lumpectomy    . Breast biopsy      FAMILY HISTORY Family History  Problem Relation Age of Onset  . CAD Mother     stent older age  . Hypertension Mother   . Breast cancer Mother 28  . Cancer Mother     UTERINE AND SKIN  . Heart disease Paternal Grandfather   . Hypertension Father   .  Hearing loss Maternal Grandfather   . Heart disease Father 74    CHF    the patient's father died with Alzheimer's disease at age 61. The patient's mother was diagnosed with breast cancer at age 43. She died from C. difficile colitis at age 65. The patient has one brother, one sister. There is no history of breast or ovarian  cancer in the immediate family.  GYNECOLOGIC HISTORY:  No LMP recorded. Patient is postmenopausal. Menarche age 54, the patient is GX P0. She underwent menopause approximately the year 2000. She did not take hormone replacement. 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 Her husband, Tanya [name], used to be in Press photographer (Set designer) but now has had 2 back fusions and is disabled. At home is just the 2 of them plus her dog Max   ADVANCED DIRECTIVES: In place   HEALTH MAINTENANCE: Social History  Substance Use Topics  . Smoking status: Current Every Day Smoker -- 1.00 packs/day for 30 years    Types: Cigarettes  . Smokeless tobacco: Never Used     Comment: Trying to smoke less.   . Alcohol Use: 8.4 oz/week    14 Standard drinks or equivalent per week     Comment: 5 DRINKS A WEEK     Colonoscopy: November 2009  PAP:  Bone density: November 2015:  Lipid panel:  Allergies  Allergen Reactions  . Codeine     REACTION: Rash  . Tetanus Toxoid     REACTION: Shock    Current Outpatient Prescriptions  Medication Sig Dispense Refill  . Calcium Carbonate-Vitamin D (CALTRATE 600+D) 600-400 MG-UNIT per tablet Take 1 tablet by mouth daily.    . Cholecalciferol (VITAMIN D-1000 MAX ST) 1000 UNITS tablet Take 1,000 Units by mouth.    Marland Kitchen ibuprofen (MOTRIN IB) 200 MG tablet Take 1 tablet (200 mg total) by mouth every 6 (six) hours as needed. 30 tablet 0  . LORazepam (ATIVAN) 0.5 MG tablet Take 1 tablet (0.5 mg total) by mouth every 8 (eight) hours. 30 tablet 0  . ondansetron (ZOFRAN) 8 MG tablet Take 1 tablet (8 mg total) by mouth 2 (two) times daily as needed for nausea or vomiting. 20 tablet 1  . prochlorperazine (COMPAZINE) 10 MG tablet Take 1 tablet (10 mg total) by mouth every 6 (six) hours as needed for nausea or vomiting. 30 tablet 1   No current  facility-administered medications for this visit.    OBJECTIVE: Middle-aged white woman who appears stated age There were no vitals filed for this visit.   There is no weight on file to calculate BMI.     ECOG FS:0 - Asymptomatic (Patient's vitals were not obtained because the patient arrived late and was moved directly to an examination room)  Sclerae unicteric, pupils round and equal Oropharynx clear and moist-- no thrush or other lesions No cervical or supraclavicular adenopathy Lungs no rales or rhonchi Heart regular rate and rhythm Abd soft, nontender, positive bowel sounds MSK no focal spinal tenderness, no upper extremity lymphedema Neuro: nonfocal, well oriented, appropriate affect Breasts: Deferred    LAB RESULTS:  CMP     Component Value Date/Time   NA 140 09/03/2014 1540   NA 138 04/03/2014 1059   K 4.1 09/03/2014 1540   K 4.2 04/03/2014 1059   CL 101 04/03/2014 1059   CO2 31* 09/03/2014 1540  CO2 33* 04/03/2014 1059   GLUCOSE 120 09/03/2014 1540   GLUCOSE 97 04/03/2014 1059   BUN 19.3 09/03/2014 1540   BUN 15 04/03/2014 1059   CREATININE 0.8 09/03/2014 1540   CREATININE 0.73 04/03/2014 1059   CREATININE 0.66 06/08/2013 1016   CALCIUM 9.6 09/03/2014 1540   CALCIUM 9.9 04/03/2014 1059   PROT 6.9 09/03/2014 1540   PROT 7.4 04/03/2014 1059   ALBUMIN 4.1 09/03/2014 1540   ALBUMIN 4.5 04/03/2014 1059   AST 17 09/03/2014 1540   AST 20 04/03/2014 1059   ALT 14 09/03/2014 1540   ALT 13 04/03/2014 1059   ALKPHOS 43 09/03/2014 1540   ALKPHOS 40 04/03/2014 1059   BILITOT 0.32 09/03/2014 1540   BILITOT 0.5 04/03/2014 1059    INo results found for: SPEP, UPEP  Lab Results  Component Value Date   WBC 6.1 09/03/2014   NEUTROABS 3.4 09/03/2014   HGB 14.6 09/03/2014   HCT 42.1 09/03/2014   MCV 98.6 09/03/2014   PLT 157 09/03/2014      Chemistry      Component Value Date/Time   NA 140 09/03/2014 1540   NA 138 04/03/2014 1059   K 4.1 09/03/2014 1540    K 4.2 04/03/2014 1059   CL 101 04/03/2014 1059   CO2 31* 09/03/2014 1540   CO2 33* 04/03/2014 1059   BUN 19.3 09/03/2014 1540   BUN 15 04/03/2014 1059   CREATININE 0.8 09/03/2014 1540   CREATININE 0.73 04/03/2014 1059   CREATININE 0.66 06/08/2013 1016      Component Value Date/Time   CALCIUM 9.6 09/03/2014 1540   CALCIUM 9.9 04/03/2014 1059   ALKPHOS 43 09/03/2014 1540   ALKPHOS 40 04/03/2014 1059   AST 17 09/03/2014 1540   AST 20 04/03/2014 1059   ALT 14 09/03/2014 1540   ALT 13 04/03/2014 1059   BILITOT 0.32 09/03/2014 1540   BILITOT 0.5 04/03/2014 1059       No results found for: LABCA2  No components found for: LABCA125  No results for input(s): INR in the last 168 hours.  Urinalysis    Component Value Date/Time   COLORURINE YELLOW 04/03/2014 1059   APPEARANCEUR CLEAR 04/03/2014 1059   LABSPEC <=1.005* 04/03/2014 1059   PHURINE 7.0 04/03/2014 1059   GLUCOSEU NEGATIVE 04/03/2014 1059   GLUCOSEU NEG 06/08/2013 1016   HGBUR TRACE-INTACT* 04/03/2014 1059   BILIRUBINUR NEGATIVE 04/03/2014 1059   KETONESUR NEGATIVE 04/03/2014 1059   PROTEINUR NEG 06/08/2013 1016   UROBILINOGEN 0.2 04/03/2014 1059   NITRITE NEGATIVE 04/03/2014 1059   LEUKOCYTESUR NEGATIVE 04/03/2014 1059    STUDIES: No results found. outside studies reviewed  ASSESSMENT: 64 y.o. [place] woman  (1) status post left lumpectomy and sentinel lymph node sampling 1999 for a T1(mic) N0, stage IA invasive breast cancer,  (a) status post adjuvant radiation  (b) status post tamoxifen for 5 years  (2) status post left breast biopsy 07/04/2014 for a clinical T1a N0, stage IA invasive ductal carcinoma, triple positive, with an MIB-1 of 80%  (3) status post left mastectomy and sentinel lymph node sampling 08/06/2014 for a pT1a pN0, stage IA invasive ductal carcinoma, grade 2, with negative margins. There was insufficient tissue to repeat HER-2 testing  (4) adjuvant chemotherapy to start 09/16/2014 and  will consist of paclitaxel weekly 12 with trastuzumab given every 3 weeks, continuing the trastuzumab alone to complete 1 year  (5) anastrozole to start once Taxol chemotherapy completed  (a) osteopenia, with a T score  of -1.4 on bone density scan 11/12/2013  (6) genetics testing pending  (7) tobacco abuse: The patient has been strongly advised to quit  PLAN: I spent approximately 45 minutes today with Tanya [name] going over her situation. She understands we cannot use pertuzumab because that is reserved for patients with larger tumors or not positive tumors. We discussed the fact that she has opted not to have a port placed. If she decides to change her mind regarding that of course we can always place  I do not know why her echocardiogram was not performed this last week as ordered. That has been ordered now. What that means is that we cannot start the trastuzumab this coming week. However we can start the Taxol. Today we discussed the possible toxicities, side effects and complications of Taxol again in detail.  Tanya [name] is concerned regarding steroid premeds and steroids for anti-nausea. Taxol is generally not a highly anti-emetic agent and I think just with ondansetron she should do well. If she gets headaches from ondansetron and we will go with Compazine. Otherwise a Compazine will be chiefly for backup.  The plan then is to start the Taxol alone September 12, obtain an echo next week, and at the trastuzumab on September 19. Tanya [name] has a good understanding of this plan. She will call with any problems that may develop before her next visit here.  Chauncey Cruel, MD   09/14/2014 10:26 AM Medical Oncology and Hematology [facility] [address] [place], [place] [address] Tel. [phone]    Fax. [phone]

## 2014-09-16 ENCOUNTER — Ambulatory Visit (HOSPITAL_BASED_OUTPATIENT_CLINIC_OR_DEPARTMENT_OTHER): Payer: 59

## 2014-09-16 VITALS — BP 105/59 | HR 67 | Temp 97.6°F | Resp 18 | Ht 64.75 in | Wt 115.0 lb

## 2014-09-16 DIAGNOSIS — Z5111 Encounter for antineoplastic chemotherapy: Secondary | ICD-10-CM | POA: Diagnosis not present

## 2014-09-16 DIAGNOSIS — C50912 Malignant neoplasm of unspecified site of left female breast: Secondary | ICD-10-CM

## 2014-09-16 MED ORDER — FAMOTIDINE IN NACL 20-0.9 MG/50ML-% IV SOLN
INTRAVENOUS | Status: AC
Start: 2014-09-16 — End: 2014-09-16
  Filled 2014-09-16: qty 50

## 2014-09-16 MED ORDER — DIPHENHYDRAMINE HCL 50 MG/ML IJ SOLN
INTRAMUSCULAR | Status: AC
  Filled 2014-09-16: qty 1

## 2014-09-16 MED ORDER — SODIUM CHLORIDE 0.9 % IV SOLN
Freq: Once | INTRAVENOUS | Status: AC
  Administered 2014-09-16: 09:00:00 via INTRAVENOUS
  Filled 2014-09-16: qty 2

## 2014-09-16 MED ORDER — FAMOTIDINE IN NACL 20-0.9 MG/50ML-% IV SOLN
20.0000 mg | Freq: Once | INTRAVENOUS | Status: AC
  Administered 2014-09-16: 20 mg via INTRAVENOUS

## 2014-09-16 MED ORDER — DEXTROSE 5 % IV SOLN
80.0000 mg/m2 | Freq: Once | INTRAVENOUS | Status: AC
  Administered 2014-09-16: 126 mg via INTRAVENOUS
  Filled 2014-09-16: qty 21

## 2014-09-16 MED ORDER — SODIUM CHLORIDE 0.9 % IV SOLN
Freq: Once | INTRAVENOUS | Status: AC
  Administered 2014-09-16: 08:00:00 via INTRAVENOUS

## 2014-09-16 MED ORDER — DIPHENHYDRAMINE HCL 50 MG/ML IJ SOLN
50.0000 mg | Freq: Once | INTRAMUSCULAR | Status: AC
  Administered 2014-09-16: 50 mg via INTRAVENOUS

## 2014-09-16 NOTE — Patient Instructions (Signed)
[facility] Discharge Instructions for Patients Receiving Chemotherapy  Today you received the following chemotherapy agents: Taxol  To help prevent nausea and vomiting after your treatment, we encourage you to take your nausea medication as prescribed by your physician.   If you develop nausea and vomiting that is not controlled by your nausea medication, call the clinic.   BELOW ARE SYMPTOMS THAT SHOULD BE REPORTED IMMEDIATELY:  *FEVER GREATER THAN 100.5 F  *CHILLS WITH OR WITHOUT FEVER  NAUSEA AND VOMITING THAT IS NOT CONTROLLED WITH YOUR NAUSEA MEDICATION  *UNUSUAL SHORTNESS OF BREATH  *UNUSUAL BRUISING OR BLEEDING  TENDERNESS IN MOUTH AND THROAT WITH OR WITHOUT PRESENCE OF ULCERS  *URINARY PROBLEMS  *BOWEL PROBLEMS  UNUSUAL RASH Items with * indicate a potential emergency and should be followed up as soon as possible.  Feel free to call the clinic you have any questions or concerns. The clinic phone number is [phone].  Please show the CHEMO ALERT CARD at check-in to the Emergency Department and triage nurse.  Paclitaxel injection (Taxol) What is this medicine? PACLITAXEL (PAK li TAX el) is a chemotherapy drug. It targets fast dividing cells, like cancer cells, and causes these cells to die. This medicine is used to treat ovarian cancer, breast cancer, and other cancers. This medicine may be used for other purposes; ask your health care provider or pharmacist if you have questions. COMMON BRAND NAME(S): Onxol, Taxol What should I tell my health care provider before I take this medicine? They need to know if you have any of these conditions: -blood disorders -irregular heartbeat -infection (especially a virus infection such as chickenpox, cold sores, or herpes) -liver disease -previous or ongoing radiation therapy -an unusual or allergic reaction to paclitaxel, alcohol, polyoxyethylated castor oil, other chemotherapy agents, other medicines,  foods, dyes, or preservatives -pregnant or trying to get pregnant -breast-feeding How should I use this medicine? This drug is given as an infusion into a vein. It is administered in a hospital or clinic by a specially trained health care professional. Talk to your pediatrician regarding the use of this medicine in children. Special care may be needed. Overdosage: If you think you have taken too much of this medicine contact a poison control center or emergency room at once. NOTE: This medicine is only for you. Do not share this medicine with others. What if I miss a dose? It is important not to miss your dose. Call your doctor or health care professional if you are unable to keep an appointment. What may interact with this medicine? Do not take this medicine with any of the following medications: -disulfiram -metronidazole This medicine may also interact with the following medications: -cyclosporine -diazepam -ketoconazole -medicines to increase blood counts like filgrastim, pegfilgrastim, sargramostim -other chemotherapy drugs like cisplatin, doxorubicin, epirubicin, etoposide, teniposide, vincristine -quinidine -testosterone -vaccines -verapamil Talk to your doctor or health care professional before taking any of these medicines: -acetaminophen -aspirin -ibuprofen -ketoprofen -naproxen This list may not describe all possible interactions. Give your health care provider a list of all the medicines, herbs, non-prescription drugs, or dietary supplements you use. Also tell them if you smoke, drink alcohol, or use illegal drugs. Some items may interact with your medicine. What should I watch for while using this medicine? Your condition will be monitored carefully while you are receiving this medicine. You will need important blood work done while you are taking this medicine. This drug may make you feel generally unwell. This is not uncommon, as   chemotherapy can affect healthy cells  as well as cancer cells. Report any side effects. Continue your course of treatment even though you feel ill unless your doctor tells you to stop. In some cases, you may be given additional medicines to help with side effects. Follow all directions for their use. Call your doctor or health care professional for advice if you get a fever, chills or sore throat, or other symptoms of a cold or flu. Do not treat yourself. This drug decreases your body's ability to fight infections. Try to avoid being around people who are sick. This medicine may increase your risk to bruise or bleed. Call your doctor or health care professional if you notice any unusual bleeding. Be careful brushing and flossing your teeth or using a toothpick because you may get an infection or bleed more easily. If you have any dental work done, tell your dentist you are receiving this medicine. Avoid taking products that contain aspirin, acetaminophen, ibuprofen, naproxen, or ketoprofen unless instructed by your doctor. These medicines may hide a fever. Do not become pregnant while taking this medicine. Women should inform their doctor if they wish to become pregnant or think they might be pregnant. There is a potential for serious side effects to an unborn child. Talk to your health care professional or pharmacist for more information. Do not breast-feed an infant while taking this medicine. Men are advised not to father a child while receiving this medicine. What side effects may I notice from receiving this medicine? Side effects that you should report to your doctor or health care professional as soon as possible: -allergic reactions like skin rash, itching or hives, swelling of the face, lips, or tongue -low blood counts - This drug may decrease the number of white blood cells, red blood cells and platelets. You may be at increased risk for infections and bleeding. -signs of infection - fever or chills, cough, sore throat, pain or  difficulty passing urine -signs of decreased platelets or bleeding - bruising, pinpoint red spots on the skin, black, tarry stools, nosebleeds -signs of decreased red blood cells - unusually weak or tired, fainting spells, lightheadedness -breathing problems -chest pain -high or low blood pressure -mouth sores -nausea and vomiting -pain, swelling, redness or irritation at the injection site -pain, tingling, numbness in the hands or feet -slow or irregular heartbeat -swelling of the ankle, feet, hands Side effects that usually do not require medical attention (report to your doctor or health care professional if they continue or are bothersome): -bone pain -complete hair loss including hair on your head, underarms, pubic hair, eyebrows, and eyelashes -changes in the color of fingernails -diarrhea -loosening of the fingernails -loss of appetite -muscle or joint pain -red flush to skin -sweating This list may not describe all possible side effects. Call your doctor for medical advice about side effects. You may report side effects to FDA at 1-800-FDA-1088. Where should I keep my medicine? This drug is given in a hospital or clinic and will not be stored at home. NOTE: This sheet is a summary. It may not cover all possible information. If you have questions about this medicine, talk to your doctor, pharmacist, or health care provider.  2015, Elsevier/Gold Standard. (2012-02-14 16:41:21)  

## 2014-09-17 ENCOUNTER — Ambulatory Visit (HOSPITAL_COMMUNITY)
Admission: RE | Admit: 2014-09-17 | Discharge: 2014-09-17 | Disposition: A | Discharge status: Home / Self Care | Payer: 59 | Source: Ambulatory Visit | Attending: Oncology | Admitting: Oncology

## 2014-09-17 DIAGNOSIS — I340 Nonrheumatic mitral (valve) insufficiency: Secondary | ICD-10-CM | POA: Diagnosis not present

## 2014-09-17 DIAGNOSIS — J449 Chronic obstructive pulmonary disease, unspecified: Secondary | ICD-10-CM | POA: Insufficient documentation

## 2014-09-17 DIAGNOSIS — Z720 Tobacco use: Secondary | ICD-10-CM | POA: Diagnosis not present

## 2014-09-17 DIAGNOSIS — C50912 Malignant neoplasm of unspecified site of left female breast: Secondary | ICD-10-CM | POA: Diagnosis present

## 2014-09-17 DIAGNOSIS — I071 Rheumatic tricuspid insufficiency: Secondary | ICD-10-CM | POA: Diagnosis not present

## 2014-09-17 NOTE — Progress Notes (Signed)
  Echocardiogram 2D Echocardiogram has been performed.  Tanya [name] 09/17/2014, 2:02 PM

## 2014-09-18 ENCOUNTER — Other Ambulatory Visit: Payer: Self-pay | Admitting: Oncology

## 2014-09-19 ENCOUNTER — Telehealth: Payer: Self-pay | Admitting: *Deleted

## 2014-09-19 NOTE — Telephone Encounter (Signed)
-----   Message from Adalberto Cole, RN sent at 09/16/2014  8:23 AM EDT ----- Regarding: [name]-chemo f/u Contact: [phone] 1st time taxol only- pt has echo at 1pm--prefers to be reached at mobile number above

## 2014-09-19 NOTE — Telephone Encounter (Signed)
Left message on patient's cell phone to call [place] back to see how she is doing after her first chemotherapy treatment.

## 2014-09-20 ENCOUNTER — Other Ambulatory Visit: Payer: Self-pay | Admitting: *Deleted

## 2014-09-20 DIAGNOSIS — C50912 Malignant neoplasm of unspecified site of left female breast: Secondary | ICD-10-CM

## 2014-09-22 NOTE — Progress Notes (Signed)
[place]  Telephone:(336) [phone] Fax:(336) [phone]     ID: Tanya [name] DOB: [birth year]  MR#: [id number]  ZHY#:[id number]  Patient Care Team: Tanya Clack, MD as PCP - General Tanya Dragon, MD (Gastroenterology) Tanya Mass, MD (Obstetrics and Gynecology) Tanya Cruel, MD as Consulting Physician (Oncology) Tanya Ok, MD as Referring Physician (Surgery) PCP: Tanya Grant, MD OTHER MD: Tanya Breeding MD, Tanya Luria MD  CHIEF COMPLAINT: Estrogen receptor positive breast cancer  CURRENT TREATMENT: Paclitaxel plus trastuzumab, to be followed by anastrozole   BREAST CANCER HISTORY: From the original intake note:  At the age of 27, the patient underwent lumpectomy and sentinel lymph node biopsy for a left sided T1(mic) N0, stage IA invasive breast cancer. She completed whole breast irradiation and received tamoxifen for 5 years. She has continued on raloxifene.  More recently, screening mammography at the breast Center with tomography 06/19/2014 showed calcifications in the left breast, which were further evaluated with diagnostic left mammography 06/26/2014. Breast density was category C. There was a 5 mm group of heterogeneous calcifications in the outer left breast. This was biopsied 07/04/2014, and showed ([id number]) invasive and in situ ductal carcinoma, grade 2. There was a minimal amount of invasive tumor, but it did allow for prognostic panel, which showed the cancer to be estrogen receptor 90% positive, progesterone receptor 80% positive, both with strong staining intensity, with an MIB-1 of 80%, and HER-2 amplified, with a signals ratio of 8.09, the number per cell being 8.90.  The patient then was evaluated at [facility] by Dr. Elisha [name] and, given the prior history of radiation, mastectomy with repeat sentinel lymph node sampling was suggested. This was performed 08/08/2014, and showed ([id number]) invasive ductal  carcinoma, grade 1, measuring 0.3 cm. Margins were negative. All 3 sentinel lymph nodes sampled were negative. Repeat prognostic panel was obtained on the DCIS only, and found the tumor to be estrogen receptor positive at 65% and progesterone receptor positive at 75%.  The patient's subsequent history is as detailed below.   INTERVAL HISTORY: Tanya [name] returns today for follow-up of her breast cancer. Today is day 1 cycle 2 of her weekly paclitaxel, and day 1 cycle 1 of trastuzumab. We were not able to start the trastuzumab at the same time as paclitaxel last week because her echo had not been done. Since her last visit here she had an echo on 09/17/2014, which shows an ejection fraction in the 50% to 55% range.   REVIEW OF SYSTEMS: She did remarkably well with the first Taxol dose, certainly with no immediate reactions, except for Benadryl. This made her very woozy and almost like she would pass out.. On day 2 in the evening after eating an apple she had minimal nausea issue took Zofran and that took care of the problem. The issue she did have those constipation. She had a bowel movement one day before treatment and then no bowel movement for 6 days until Sunday. Around Thursday she started eating some bran and later on added a stool softener. Son that she was able to have 2 normal bowel movements and she had a normal bowel movement this morning as well. There was no bleeding or significant straining. She has some joint pain here in there which is not more intense or persistent than before. A detailed review of systems today was otherwise noncontributory  PAST MEDICAL HISTORY: Past Medical History  Diagnosis Date  . COPD, mild   . Smoker   .  Osteopenia     DEXA 10/27/11: -1.4, no change from 10/2009  . Shingles   . Breast cancer     Radiation and lumpectomy    PAST SURGICAL HISTORY: Past Surgical History  Procedure Laterality Date  . Tonsillectomy and adenoidectomy    . Dilation and  evacuation    . Breast lumpectomy    . Breast biopsy      FAMILY HISTORY Family History  Problem Relation Age of Onset  . CAD Mother     stent older age  . Hypertension Mother   . Breast cancer Mother 55  . Cancer Mother     UTERINE AND SKIN  . Heart disease Paternal Grandfather   . Hypertension Father   . Hearing loss Maternal Grandfather   . Heart disease Father 68    CHF    the patient's father died with Alzheimer's disease at age 46. The patient's mother was diagnosed with breast cancer at age 72. She died from C. difficile colitis at age 74. The patient has one brother, one sister. There is no history of breast or ovarian cancer in the immediate family.  GYNECOLOGIC HISTORY:  No LMP recorded. Patient is postmenopausal. Menarche age 74, the patient is GX P0. She underwent menopause approximately the year 2000. She did not take hormone replacement. 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 Her husband, Tanya [name], used to be in Press photographer (Set designer) but now has had 2 back fusions and is disabled. At home is just the 2 of them plus her dog Max   ADVANCED DIRECTIVES: In place   HEALTH MAINTENANCE: Social History  Substance Use Topics  . Smoking status: Current Every Day Smoker -- 1.00 packs/day for 30 years    Types: Cigarettes  . Smokeless tobacco: Never Used     Comment: Trying to smoke less.   . Alcohol Use: 8.4 oz/week    14 Standard drinks or equivalent per week     Comment: 5 DRINKS A WEEK     Colonoscopy: November 2009  PAP:  Bone density: November 2015:  Lipid panel:  Allergies  Allergen Reactions  . Codeine     REACTION: Rash  . Tetanus Toxoid     REACTION: Shock    Current Outpatient Prescriptions  Medication Sig Dispense Refill  . Calcium Carbonate-Vitamin D (CALTRATE 600+D) 600-400 MG-UNIT per tablet Take 1 tablet by  mouth daily.    . Cholecalciferol (VITAMIN D-1000 MAX ST) 1000 UNITS tablet Take 1,000 Units by mouth.    Marland Kitchen ibuprofen (MOTRIN IB) 200 MG tablet Take 1 tablet (200 mg total) by mouth every 6 (six) hours as needed. 30 tablet 0  . LORazepam (ATIVAN) 0.5 MG tablet Take 1 tablet (0.5 mg total) by mouth every 8 (eight) hours. 30 tablet 0  . ondansetron (ZOFRAN) 8 MG tablet Take 1 tablet (8 mg total) by mouth 2 (two) times daily as needed for nausea or vomiting. 20 tablet 1  . prochlorperazine (COMPAZINE) 10 MG tablet Take 1 tablet (10 mg total) by mouth every 6 (six) hours as needed for nausea or vomiting. 30 tablet 1   No current facility-administered medications for this visit.    OBJECTIVE: Middle-aged white woman Filed Vitals:   09/23/14 0918  BP: 103/59  Pulse: 81  Temp: 98.6 F (37 C)  Resp: 18     Body [name] index is  19.24 kg/(m^2).      ECOG FS:0 - Asymptomatic  Sclerae unicteric, EOMs intact Oropharynx clear, dentition in good repair No cervical or supraclavicular adenopathy Lungs no rales or rhonchi Heart regular rate and rhythm Abd soft, nontender, positive bowel sounds MSK no focal spinal tenderness, no upper extremity lymphedema Neuro: nonfocal, well oriented, appropriate affect Breasts: Deferred     LAB RESULTS:  CMP     Component Value Date/Time   NA 139 09/23/2014 0849   NA 138 04/03/2014 1059   K 4.3 09/23/2014 0849   K 4.2 04/03/2014 1059   CL 101 04/03/2014 1059   CO2 28 09/23/2014 0849   CO2 33* 04/03/2014 1059   GLUCOSE 99 09/23/2014 0849   GLUCOSE 97 04/03/2014 1059   BUN 15.9 09/23/2014 0849   BUN 15 04/03/2014 1059   CREATININE 0.8 09/23/2014 0849   CREATININE 0.73 04/03/2014 1059   CREATININE 0.66 06/08/2013 1016   CALCIUM 9.7 09/23/2014 0849   CALCIUM 9.9 04/03/2014 1059   PROT 6.9 09/23/2014 0849   PROT 7.4 04/03/2014 1059   ALBUMIN 4.2 09/23/2014 0849   ALBUMIN 4.5 04/03/2014 1059   AST 20 09/23/2014 0849   AST 20 04/03/2014 1059    ALT 19 09/23/2014 0849   ALT 13 04/03/2014 1059   ALKPHOS 44 09/23/2014 0849   ALKPHOS 40 04/03/2014 1059   BILITOT 0.38 09/23/2014 0849   BILITOT 0.5 04/03/2014 1059    INo results found for: SPEP, UPEP  Lab Results  Component Value Date   WBC 3.2* 09/23/2014   NEUTROABS 1.7 09/23/2014   HGB 14.5 09/23/2014   HCT 42.8 09/23/2014   MCV 99.4 09/23/2014   PLT 162 09/23/2014      Chemistry      Component Value Date/Time   NA 139 09/23/2014 0849   NA 138 04/03/2014 1059   K 4.3 09/23/2014 0849   K 4.2 04/03/2014 1059   CL 101 04/03/2014 1059   CO2 28 09/23/2014 0849   CO2 33* 04/03/2014 1059   BUN 15.9 09/23/2014 0849   BUN 15 04/03/2014 1059   CREATININE 0.8 09/23/2014 0849   CREATININE 0.73 04/03/2014 1059   CREATININE 0.66 06/08/2013 1016      Component Value Date/Time   CALCIUM 9.7 09/23/2014 0849   CALCIUM 9.9 04/03/2014 1059   ALKPHOS 44 09/23/2014 0849   ALKPHOS 40 04/03/2014 1059   AST 20 09/23/2014 0849   AST 20 04/03/2014 1059   ALT 19 09/23/2014 0849   ALT 13 04/03/2014 1059   BILITOT 0.38 09/23/2014 0849   BILITOT 0.5 04/03/2014 1059       No results found for: LABCA2  No components found for: LABCA125  No results for input(s): INR in the last 168 hours.  Urinalysis    Component Value Date/Time   COLORURINE YELLOW 04/03/2014 1059   APPEARANCEUR CLEAR 04/03/2014 1059   LABSPEC <=1.005* 04/03/2014 1059   PHURINE 7.0 04/03/2014 1059   GLUCOSEU NEGATIVE 04/03/2014 1059   GLUCOSEU NEG 06/08/2013 1016   HGBUR TRACE-INTACT* 04/03/2014 1059   BILIRUBINUR NEGATIVE 04/03/2014 1059   KETONESUR NEGATIVE 04/03/2014 1059   PROTEINUR NEG 06/08/2013 1016   UROBILINOGEN 0.2 04/03/2014 1059   NITRITE NEGATIVE 04/03/2014 1059   LEUKOCYTESUR NEGATIVE 04/03/2014 1059    STUDIES: Transthoracic Echocardiography  Patient:  Tanya [name], Tanya [name] MR #:    [id number] Study Date: 09/17/2014 Gender:   F Age:    4 Height:   165.1 cm Weight:    52.2 kg BSA:  1.54 m^2 Pt. Status: Room:  ATTENDING  [name], Valli Glance   [name], [name] REFERRING  [name], [name] SONOGRAPHER Tresa Res, RDCS PERFORMING  [facility], Outpatient  cc:  ------------------------------------------------------------------- LV EF: 50% -  55%    ASSESSMENT: 64 y.o. [place] woman  (1) status post left lumpectomy and sentinel lymph node sampling 1999 for a T1(mic) N0, stage IA invasive breast cancer,  (a) status post adjuvant radiation  (b) status post tamoxifen for 5 years  (2) status post left breast biopsy 07/04/2014 for a clinical T1a N0, stage IA invasive ductal carcinoma, triple positive, with an MIB-1 of 80%  (3) status post left mastectomy and sentinel lymph node sampling 08/06/2014 for a pT1a pN0, stage IA invasive ductal carcinoma, grade 2, with negative margins. There was insufficient tissue to repeat HER-2 testing  (4) adjuvant chemotherapy started 09/16/2014 consisting of paclitaxel weekly 12 with trastuzumab given every 3 weeks, continuing the trastuzumab alone to complete 1 year  (a) echo 09/17/2014 shows a normal ejection fraction  (5) anastrozole to start once Taxol chemotherapy completed  (a) osteopenia, with a T score of -1.4 on bone density scan 11/12/2013  (6) genetics testing pending  (7) tobacco abuse: The patient has been strongly advised to quit  PLAN: She did very well with her first Taxol dose and the plan is to continue that weekly 12.  Her echocardiogram shows a normal ejection fraction. Accordingly we are starting trastuzumab today. She has a good understanding of the possible toxicities, side effects and complications of this agent. Because her ejection fraction, while normal, is at the lower limit of normal, we will repeat an echo in 2 months instead of the usual 3, after which if things are stable we will go back to the usual pattern of every 3 month echo son tell she completes her  year of trastuzumab  She understands she will have the same constipation that she did before unless she does something about it. She is eating bran for breakfast in that or ready is helping. She can use stool softeners daily as well. If by day 3 she has not had a bowel movement she will add MiraLAX on a Friday for she does not have a bowel movement she will call us  She will have a very small amount of Benadryl today with her first trastuzumab dose, and then no Benadryl and subsequent treatments.  She has a good understanding of this plan. She agrees with it. She knows a goal of treatment in her case is cure.   Tanya Cruel, MD   09/23/2014 9:48 AM Medical Oncology and Hematology [facility] [address] [place], [place] [address] Tel. [phone]    Fax. [phone]

## 2014-09-23 ENCOUNTER — Ambulatory Visit: Payer: 59

## 2014-09-23 ENCOUNTER — Ambulatory Visit (HOSPITAL_BASED_OUTPATIENT_CLINIC_OR_DEPARTMENT_OTHER): Payer: 59 | Admitting: Oncology

## 2014-09-23 ENCOUNTER — Other Ambulatory Visit (HOSPITAL_BASED_OUTPATIENT_CLINIC_OR_DEPARTMENT_OTHER): Payer: 59

## 2014-09-23 ENCOUNTER — Encounter: Payer: Self-pay | Admitting: Oncology

## 2014-09-23 ENCOUNTER — Ambulatory Visit (HOSPITAL_BASED_OUTPATIENT_CLINIC_OR_DEPARTMENT_OTHER): Payer: 59

## 2014-09-23 VITALS — BP 103/59 | HR 81 | Temp 98.6°F | Resp 18 | Ht 64.75 in | Wt 114.8 lb

## 2014-09-23 DIAGNOSIS — Z720 Tobacco use: Secondary | ICD-10-CM

## 2014-09-23 DIAGNOSIS — C50912 Malignant neoplasm of unspecified site of left female breast: Secondary | ICD-10-CM

## 2014-09-23 DIAGNOSIS — F17200 Nicotine dependence, unspecified, uncomplicated: Secondary | ICD-10-CM

## 2014-09-23 DIAGNOSIS — Z5111 Encounter for antineoplastic chemotherapy: Secondary | ICD-10-CM | POA: Diagnosis not present

## 2014-09-23 LAB — CBC WITH DIFFERENTIAL/PLATELET
BASO%: 1 % (ref 0.0–2.0)
Basophils Absolute: 0 10*3/uL (ref 0.0–0.1)
EOS%: 1.4 % (ref 0.0–7.0)
Eosinophils Absolute: 0 10*3/uL (ref 0.0–0.5)
HEMATOCRIT: 42.8 % (ref 34.8–46.6)
HEMOGLOBIN: 14.5 g/dL (ref 11.6–15.9)
LYMPH%: 34.9 % (ref 14.0–49.7)
MCH: 33.8 pg (ref 25.1–34.0)
MCHC: 34 g/dL (ref 31.5–36.0)
MCV: 99.4 fL (ref 79.5–101.0)
MONO#: 0.3 10*3/uL (ref 0.1–0.9)
MONO%: 7.9 % (ref 0.0–14.0)
NEUT%: 54.8 % (ref 38.4–76.8)
NEUTROS ABS: 1.7 10*3/uL (ref 1.5–6.5)
Platelets: 162 10*3/uL (ref 145–400)
RBC: 4.31 10*6/uL (ref 3.70–5.45)
RDW: 12.3 % (ref 11.2–14.5)
WBC: 3.2 10*3/uL — ABNORMAL LOW (ref 3.9–10.3)
lymph#: 1.1 10*3/uL (ref 0.9–3.3)

## 2014-09-23 LAB — COMPREHENSIVE METABOLIC PANEL (CC13)
ALBUMIN: 4.2 g/dL (ref 3.5–5.0)
ALK PHOS: 44 U/L (ref 40–150)
ALT: 19 U/L (ref 0–55)
AST: 20 U/L (ref 5–34)
Anion Gap: 9 mEq/L (ref 3–11)
BILIRUBIN TOTAL: 0.38 mg/dL (ref 0.20–1.20)
BUN: 15.9 mg/dL (ref 7.0–26.0)
CALCIUM: 9.7 mg/dL (ref 8.4–10.4)
CO2: 28 mEq/L (ref 22–29)
Chloride: 102 mEq/L (ref 98–109)
Creatinine: 0.8 mg/dL (ref 0.6–1.1)
EGFR: 81 mL/min/{1.73_m2} — ABNORMAL LOW (ref >90)
GLUCOSE: 99 mg/dL (ref 70–140)
POTASSIUM: 4.3 meq/L (ref 3.5–5.1)
SODIUM: 139 meq/L (ref 136–145)
TOTAL PROTEIN: 6.9 g/dL (ref 6.4–8.3)

## 2014-09-23 MED ORDER — SODIUM CHLORIDE 0.9 % IV SOLN: 4.0000 mg | Freq: Once | INTRAVENOUS | Status: DC

## 2014-09-23 MED ORDER — SODIUM CHLORIDE 0.9 % IV SOLN
Freq: Once | INTRAVENOUS | Status: AC
  Administered 2014-09-23: 11:00:00 via INTRAVENOUS

## 2014-09-23 MED ORDER — ACETAMINOPHEN 325 MG PO TABS
650.0000 mg | ORAL_TABLET | Freq: Once | ORAL | Status: AC
  Administered 2014-09-23: 650 mg via ORAL

## 2014-09-23 MED ORDER — FAMOTIDINE IN NACL 20-0.9 MG/50ML-% IV SOLN
20.0000 mg | Freq: Once | INTRAVENOUS | Status: AC
  Administered 2014-09-23: 20 mg via INTRAVENOUS

## 2014-09-23 MED ORDER — DIPHENHYDRAMINE HCL 25 MG PO CAPS: 12.5000 mg | ORAL_CAPSULE | Freq: Once | ORAL | Status: DC

## 2014-09-23 MED ORDER — PACLITAXEL CHEMO INJECTION 300 MG/50ML
80.0000 mg/m2 | Freq: Once | INTRAVENOUS | Status: AC
  Administered 2014-09-23: 126 mg via INTRAVENOUS
  Filled 2014-09-23: qty 21

## 2014-09-23 MED ORDER — DIPHENHYDRAMINE HCL 50 MG/ML IJ SOLN
INTRAMUSCULAR | Status: AC
  Filled 2014-09-23: qty 1

## 2014-09-23 MED ORDER — ACETAMINOPHEN 325 MG PO TABS
ORAL_TABLET | ORAL | Status: AC
  Filled 2014-09-23: qty 2

## 2014-09-23 MED ORDER — FAMOTIDINE IN NACL 20-0.9 MG/50ML-% IV SOLN
INTRAVENOUS | Status: AC
  Filled 2014-09-23: qty 50

## 2014-09-23 MED ORDER — SODIUM CHLORIDE 0.9 % IV SOLN
Freq: Once | INTRAVENOUS | Status: AC
  Administered 2014-09-23: 11:00:00 via INTRAVENOUS
  Filled 2014-09-23: qty 2

## 2014-09-23 MED ORDER — DIPHENHYDRAMINE HCL 50 MG/ML IJ SOLN
12.5000 mg | Freq: Once | INTRAMUSCULAR | Status: AC
  Administered 2014-09-23: 12.5 mg via INTRAVENOUS

## 2014-09-23 MED ORDER — TRASTUZUMAB CHEMO INJECTION 440 MG
8.0000 mg/kg | Freq: Once | INTRAVENOUS | Status: AC
  Administered 2014-09-23: 420 mg via INTRAVENOUS
  Filled 2014-09-23: qty 20

## 2014-09-23 NOTE — Patient Instructions (Signed)
[facility] Discharge Instructions for Patients Receiving Chemotherapy  Today you received the following chemotherapy agents: Taxol and herceptin.  To help prevent nausea and vomiting after your treatment, we encourage you to take your nausea medication as prescribed by your physician.   If you develop nausea and vomiting that is not controlled by your nausea medication, call the clinic.   BELOW ARE SYMPTOMS THAT SHOULD BE REPORTED IMMEDIATELY:  *FEVER GREATER THAN 100.5 F  *CHILLS WITH OR WITHOUT FEVER  NAUSEA AND VOMITING THAT IS NOT CONTROLLED WITH YOUR NAUSEA MEDICATION  *UNUSUAL SHORTNESS OF BREATH  *UNUSUAL BRUISING OR BLEEDING  TENDERNESS IN MOUTH AND THROAT WITH OR WITHOUT PRESENCE OF ULCERS  *URINARY PROBLEMS  *BOWEL PROBLEMS  UNUSUAL RASH Items with * indicate a potential emergency and should be followed up as soon as possible.  Feel free to call the clinic you have any questions or concerns. The clinic phone number is [phone].  Please show the [facility] at check-in to the Emergency Department and triage nurse.  Paclitaxel injection (Taxol) What is this medicine? PACLITAXEL (PAK li TAX el) is a chemotherapy drug. It targets fast dividing cells, like cancer cells, and causes these cells to die. This medicine is used to treat ovarian cancer, breast cancer, and other cancers. This medicine may be used for other purposes; ask your health care provider or pharmacist if you have questions. COMMON BRAND NAME(S): Onxol, Taxol What should I tell my health care provider before I take this medicine? They need to know if you have any of these conditions: -blood disorders -irregular heartbeat -infection (especially a virus infection such as chickenpox, cold sores, or herpes) -liver disease -previous or ongoing radiation therapy -an unusual or allergic reaction to paclitaxel, alcohol, polyoxyethylated castor oil, other chemotherapy agents, other  medicines, foods, dyes, or preservatives -pregnant or trying to get pregnant -breast-feeding How should I use this medicine? This drug is given as an infusion into a vein. It is administered in a hospital or clinic by a specially trained health care professional. Talk to your pediatrician regarding the use of this medicine in children. Special care may be needed. Overdosage: If you think you have taken too much of this medicine contact a poison control center or emergency room at once. NOTE: This medicine is only for you. Do not share this medicine with others. What if I miss a dose? It is important not to miss your dose. Call your doctor or health care professional if you are unable to keep an appointment. What may interact with this medicine? Do not take this medicine with any of the following medications: -disulfiram -metronidazole This medicine may also interact with the following medications: -cyclosporine -diazepam -ketoconazole -medicines to increase blood counts like filgrastim, pegfilgrastim, sargramostim -other chemotherapy drugs like cisplatin, doxorubicin, epirubicin, etoposide, teniposide, vincristine -quinidine -testosterone -vaccines -verapamil Talk to your doctor or health care professional before taking any of these medicines: -acetaminophen -aspirin -ibuprofen -ketoprofen -naproxen This list may not describe all possible interactions. Give your health care provider a list of all the medicines, herbs, non-prescription drugs, or dietary supplements you use. Also tell them if you smoke, drink alcohol, or use illegal drugs. Some items may interact with your medicine. What should I watch for while using this medicine? Your condition will be monitored carefully while you are receiving this medicine. You will need important blood work done while you are taking this medicine. This drug may make you feel generally unwell. This is not  uncommon, as chemotherapy can affect  healthy cells as well as cancer cells. Report any side effects. Continue your course of treatment even though you feel ill unless your doctor tells you to stop. In some cases, you may be given additional medicines to help with side effects. Follow all directions for their use. Call your doctor or health care professional for advice if you get a fever, chills or sore throat, or other symptoms of a cold or flu. Do not treat yourself. This drug decreases your body's ability to fight infections. Try to avoid being around people who are sick. This medicine may increase your risk to bruise or bleed. Call your doctor or health care professional if you notice any unusual bleeding. Be careful brushing and flossing your teeth or using a toothpick because you may get an infection or bleed more easily. If you have any dental work done, tell your dentist you are receiving this medicine. Avoid taking products that contain aspirin, acetaminophen, ibuprofen, naproxen, or ketoprofen unless instructed by your doctor. These medicines may hide a fever. Do not become pregnant while taking this medicine. Women should inform their doctor if they wish to become pregnant or think they might be pregnant. There is a potential for serious side effects to an unborn child. Talk to your health care professional or pharmacist for more information. Do not breast-feed an infant while taking this medicine. Men are advised not to father a child while receiving this medicine. What side effects may I notice from receiving this medicine? Side effects that you should report to your doctor or health care professional as soon as possible: -allergic reactions like skin rash, itching or hives, swelling of the face, lips, or tongue -low blood counts - This drug may decrease the number of white blood cells, red blood cells and platelets. You may be at increased risk for infections and bleeding. -signs of infection - fever or chills, cough, sore  throat, pain or difficulty passing urine -signs of decreased platelets or bleeding - bruising, pinpoint red spots on the skin, black, tarry stools, nosebleeds -signs of decreased red blood cells - unusually weak or tired, fainting spells, lightheadedness -breathing problems -chest pain -high or low blood pressure -mouth sores -nausea and vomiting -pain, swelling, redness or irritation at the injection site -pain, tingling, numbness in the hands or feet -slow or irregular heartbeat -swelling of the ankle, feet, hands Side effects that usually do not require medical attention (report to your doctor or health care professional if they continue or are bothersome): -bone pain -complete hair loss including hair on your head, underarms, pubic hair, eyebrows, and eyelashes -changes in the color of fingernails -diarrhea -loosening of the fingernails -loss of appetite -muscle or joint pain -red flush to skin -sweating This list may not describe all possible side effects. Call your doctor for medical advice about side effects. You may report side effects to FDA at 1-800-FDA-1088. Where should I keep my medicine? This drug is given in a hospital or clinic and will not be stored at home. NOTE: This sheet is a summary. It may not cover all possible information. If you have questions about this medicine, talk to your doctor, pharmacist, or health care provider.  2015, [employer]vier/Gold Standard. (2012-02-14 16:41:21)

## 2014-09-27 ENCOUNTER — Other Ambulatory Visit: Payer: Self-pay | Admitting: *Deleted

## 2014-09-27 DIAGNOSIS — C50912 Malignant neoplasm of unspecified site of left female breast: Secondary | ICD-10-CM

## 2014-09-30 ENCOUNTER — Encounter: Payer: Self-pay | Admitting: Nurse Practitioner

## 2014-09-30 ENCOUNTER — Other Ambulatory Visit (HOSPITAL_BASED_OUTPATIENT_CLINIC_OR_DEPARTMENT_OTHER): Payer: 59

## 2014-09-30 ENCOUNTER — Ambulatory Visit (HOSPITAL_BASED_OUTPATIENT_CLINIC_OR_DEPARTMENT_OTHER): Payer: 59

## 2014-09-30 ENCOUNTER — Ambulatory Visit (HOSPITAL_BASED_OUTPATIENT_CLINIC_OR_DEPARTMENT_OTHER): Payer: 59 | Admitting: Nurse Practitioner

## 2014-09-30 VITALS — BP 118/73 | HR 84 | Temp 98.2°F | Resp 18 | Ht 64.75 in | Wt 118.5 lb

## 2014-09-30 DIAGNOSIS — C50912 Malignant neoplasm of unspecified site of left female breast: Secondary | ICD-10-CM | POA: Diagnosis not present

## 2014-09-30 DIAGNOSIS — Z5111 Encounter for antineoplastic chemotherapy: Secondary | ICD-10-CM

## 2014-09-30 LAB — CBC WITH DIFFERENTIAL/PLATELET
BASO%: 1 % (ref 0.0–2.0)
Basophils Absolute: 0 10*3/uL (ref 0.0–0.1)
EOS%: 1.2 % (ref 0.0–7.0)
Eosinophils Absolute: 0 10*3/uL (ref 0.0–0.5)
HCT: 42.6 % (ref 34.8–46.6)
HGB: 14.3 g/dL (ref 11.6–15.9)
LYMPH#: 1.2 10*3/uL (ref 0.9–3.3)
LYMPH%: 32.2 % (ref 14.0–49.7)
MCH: 33.4 pg (ref 25.1–34.0)
MCHC: 33.6 g/dL (ref 31.5–36.0)
MCV: 99.5 fL (ref 79.5–101.0)
MONO#: 0.2 10*3/uL (ref 0.1–0.9)
MONO%: 5.2 % (ref 0.0–14.0)
NEUT#: 2.3 10*3/uL (ref 1.5–6.5)
NEUT%: 60.4 % (ref 38.4–76.8)
Platelets: 188 10*3/uL (ref 145–400)
RBC: 4.28 10*6/uL (ref 3.70–5.45)
RDW: 12.7 % (ref 11.2–14.5)
WBC: 3.7 10*3/uL — ABNORMAL LOW (ref 3.9–10.3)

## 2014-09-30 LAB — COMPREHENSIVE METABOLIC PANEL (CC13)
ALT: 21 U/L (ref 0–55)
AST: 18 U/L (ref 5–34)
Albumin: 3.9 g/dL (ref 3.5–5.0)
Alkaline Phosphatase: 47 U/L (ref 40–150)
Anion Gap: 8 mEq/L (ref 3–11)
BUN: 14.8 mg/dL (ref 7.0–26.0)
CALCIUM: 9.9 mg/dL (ref 8.4–10.4)
CHLORIDE: 102 meq/L (ref 98–109)
CO2: 31 meq/L — AB (ref 22–29)
CREATININE: 0.8 mg/dL (ref 0.6–1.1)
EGFR: 81 mL/min/{1.73_m2} — ABNORMAL LOW (ref >90)
GLUCOSE: 106 mg/dL (ref 70–140)
Potassium: 4.4 mEq/L (ref 3.5–5.1)
Sodium: 141 mEq/L (ref 136–145)
Total Bilirubin: 0.34 mg/dL (ref 0.20–1.20)
Total Protein: 6.6 g/dL (ref 6.4–8.3)

## 2014-09-30 MED ORDER — SODIUM CHLORIDE 0.9 % IV SOLN
Freq: Once | INTRAVENOUS | Status: AC
  Administered 2014-09-30: 10:00:00 via INTRAVENOUS
  Filled 2014-09-30: qty 2

## 2014-09-30 MED ORDER — PACLITAXEL CHEMO INJECTION 300 MG/50ML
80.0000 mg/m2 | Freq: Once | INTRAVENOUS | Status: AC
  Administered 2014-09-30: 126 mg via INTRAVENOUS
  Filled 2014-09-30: qty 21

## 2014-09-30 MED ORDER — FAMOTIDINE IN NACL 20-0.9 MG/50ML-% IV SOLN
20.0000 mg | Freq: Once | INTRAVENOUS | Status: AC
Start: 2014-09-30 — End: 2014-09-30
  Administered 2014-09-30: 20 mg via INTRAVENOUS

## 2014-09-30 MED ORDER — FAMOTIDINE IN NACL 20-0.9 MG/50ML-% IV SOLN
INTRAVENOUS | Status: AC
  Filled 2014-09-30: qty 50

## 2014-09-30 MED ORDER — SODIUM CHLORIDE 0.9 % IV SOLN
Freq: Once | INTRAVENOUS | Status: AC
  Administered 2014-09-30: 10:00:00 via INTRAVENOUS

## 2014-09-30 NOTE — Patient Instructions (Signed)
[facility] Discharge Instructions for Patients Receiving Chemotherapy  Today you received the following chemotherapy agents:  Taxol  To help prevent nausea and vomiting after your treatment, we encourage you to take your nausea medication as ordered per MD.   If you develop nausea and vomiting that is not controlled by your nausea medication, call the clinic.   BELOW ARE SYMPTOMS THAT SHOULD BE REPORTED IMMEDIATELY:  *FEVER GREATER THAN 100.5 F  *CHILLS WITH OR WITHOUT FEVER  NAUSEA AND VOMITING THAT IS NOT CONTROLLED WITH YOUR NAUSEA MEDICATION  *UNUSUAL SHORTNESS OF BREATH  *UNUSUAL BRUISING OR BLEEDING  TENDERNESS IN MOUTH AND THROAT WITH OR WITHOUT PRESENCE OF ULCERS  *URINARY PROBLEMS  *BOWEL PROBLEMS  UNUSUAL RASH Items with * indicate a potential emergency and should be followed up as soon as possible.  Feel free to call the clinic you have any questions or concerns. The clinic phone number is [phone].  Please show the CHEMO ALERT CARD at check-in to the Emergency Department and triage nurse.   

## 2014-09-30 NOTE — Progress Notes (Signed)
Approximately 20 minutes into taxol infusion, pt noted small raised area and mild tenderness around IV site.  Taxol infusion paused at this time, blood return checked and noted.  Restarted IV, however swelling seemed to get worse.  Aspirated approximately 5cc from PIV and d/c'd.  No redness to area. Ice applied.  Pt instructed to apply ice for fifteen minutes 4 times a day.  PIV restarted to right AC and remaining taxol infused.  Extravasation form filled out.  Pt will return on Wednesday, 9/28 after genetics apt for site check.  Ok per Paton to return at 48hours without apt time.  Pt instructed to call in the interim if site worsens, pt verbalized understanding.

## 2014-09-30 NOTE — Progress Notes (Signed)
[facility]  [phone] [phone]     ID: Tanya [name] DOB: Mar 21, 1950  MR#: [id number]  PPJ#:[id number]  Patient Care Team: Tanya Clack, MD as PCP - General Tanya Dragon, MD (Gastroenterology) Tanya Mass, MD (Obstetrics and Gynecology) Tanya Cruel, MD as Consulting Physician (Oncology) Tanya Ok, MD as Referring Physician (Surgery) PCP: Tanya Grant, MD OTHER MD: Tanya Breeding MD, Tanya Luria MD  CHIEF COMPLAINT: Estrogen receptor positive breast cancer  CURRENT TREATMENT: Paclitaxel plus trastuzumab, to be followed by anastrozole   BREAST CANCER HISTORY: From the original intake note:  At the age of 17, the patient underwent lumpectomy and sentinel lymph node biopsy for a left sided T1(mic) N0, stage IA invasive breast cancer. She completed whole breast irradiation and received tamoxifen for 5 years. She has continued on raloxifene.  More recently, screening mammography at the breast Center with tomography 06/19/2014 showed calcifications in the left breast, which were further evaluated with diagnostic left mammography 06/26/2014. Breast density was category C. There was a 5 mm group of heterogeneous calcifications in the outer left breast. This was biopsied 07/04/2014, and showed ([id number]) invasive and in situ ductal carcinoma, grade 2. There was a minimal amount of invasive tumor, but it did allow for prognostic panel, which showed the cancer to be estrogen receptor 90% positive, progesterone receptor 80% positive, both with strong staining intensity, with an MIB-1 of 80%, and HER-2 amplified, with a signals ratio of 8.09, the number per cell being 8.90.  The patient then was evaluated at [facility] by Dr. Elisha [name] and, given the prior history of radiation, mastectomy with repeat sentinel lymph node sampling was suggested. This was performed 08/08/2014, and showed ([id number]) invasive ductal  carcinoma, grade 1, measuring 0.3 cm. Margins were negative. All 3 sentinel lymph nodes sampled were negative. Repeat prognostic panel was obtained on the DCIS only, and found the tumor to be estrogen receptor positive at 65% and progesterone receptor positive at 75%.  The patient's subsequent history is as detailed below.   INTERVAL HISTORY: Tanya [name] returns today for follow-up of her breast cancer. Today is day 1 cycle 3 of her weekly paclitaxel, with trastuzumab given every 3 weeks (next due on 10/10).   REVIEW OF SYSTEMS: [name] denies fevers, chills, nausea or vomiting. She is using miralax PRN constipation and eating bran cereals. Her appetite is unchanged. She is only drinking about 32oz of water daily. She denies mouth sores, rashes, or neuropathy symptoms. She denies shortness of breath, chest pain, cough, or palpitations. She is having headaches every other day. A detailed review of systems is otherwise stable.   PAST MEDICAL HISTORY: Past Medical History  Diagnosis Date  . COPD, mild   . Smoker   . Osteopenia     DEXA 10/27/11: -1.4, no change from 10/2009  . Shingles   . Breast cancer     Radiation and lumpectomy    PAST SURGICAL HISTORY: Past Surgical History  Procedure Laterality Date  . Tonsillectomy and adenoidectomy    . Dilation and evacuation    . Breast lumpectomy    . Breast biopsy      FAMILY HISTORY Family History  Problem Relation Age of Onset  . CAD Mother     stent older age  . Hypertension Mother   . Breast cancer Mother 12  . Cancer Mother     UTERINE AND SKIN  . Heart disease Paternal Grandfather   . Hypertension Father   .  Hearing loss Maternal Grandfather   . Heart disease Father 63    CHF    the patient's father died with Alzheimer's disease at age 71. The patient's mother was diagnosed with breast cancer at age 47. She died from C. difficile colitis at age 40. The patient has one brother, one sister. There is no history of breast or  ovarian cancer in the immediate family.  GYNECOLOGIC HISTORY:  No LMP recorded. Patient is postmenopausal. Menarche age 49, the patient is GX P0. She underwent menopause approximately the year 2000. She did not take hormone replacement. 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 Her husband, Tanya [name], used to be in Press photographer (Set designer) but now has had 2 back fusions and is disabled. At home is just the 2 of them plus her dog Tanya [name]   ADVANCED DIRECTIVES: In place   HEALTH MAINTENANCE: Social History  Substance Use Topics  . Smoking status: Current Every Day Smoker -- 1.00 packs/day for 30 years    Types: Cigarettes  . Smokeless tobacco: Never Used     Comment: Trying to smoke less.   . Alcohol Use: 8.4 oz/week    14 Standard drinks or equivalent per week     Comment: 5 DRINKS A WEEK     Colonoscopy: November 2009  PAP:  Bone density: November 2015:  Lipid panel:  Allergies  Allergen Reactions  . Codeine     REACTION: Rash  . Tetanus Toxoid     REACTION: Shock    Current Outpatient Prescriptions  Medication Sig Dispense Refill  . Calcium Carbonate-Vitamin D (CALTRATE 600+D) 600-400 MG-UNIT per tablet Take 1 tablet by mouth daily.    . Cholecalciferol (VITAMIN D-1000 Tanya [name] ST) 1000 UNITS tablet Take 1,000 Units by mouth.    . Misc Natural Products (OSTEO BI-FLEX TRIPLE STRENGTH) TABS Take 1 tablet by mouth 2 (two) times daily.    . ondansetron (ZOFRAN) 8 MG tablet Take 1 tablet (8 mg total) by mouth 2 (two) times daily as needed for nausea or vomiting. 20 tablet 1  . prochlorperazine (COMPAZINE) 10 MG tablet Take 1 tablet (10 mg total) by mouth every 6 (six) hours as needed for nausea or vomiting. 30 tablet 1  . ibuprofen (MOTRIN IB) 200 MG tablet Take 1 tablet (200 mg total) by mouth every 6 (six) hours as needed. 30 tablet 0  . LORazepam (ATIVAN)  0.5 MG tablet Take 1 tablet (0.5 mg total) by mouth every 8 (eight) hours. (Patient not taking: Reported on 09/30/2014) 30 tablet 0   No current facility-administered medications for this visit.    OBJECTIVE: Middle-aged white woman Filed Vitals:   09/30/14 0851  BP: 118/73  Pulse: 84  Temp: 98.2 F (36.8 C)  Resp: 18     Body [name] index is 19.86 kg/(m^2).      ECOG FS:1 - Symptomatic but completely ambulatory  Skin: warm, dry  HEENT: sclerae anicteric, conjunctivae pink, oropharynx clear. No thrush or mucositis.  Lymph Nodes: No cervical or supraclavicular lymphadenopathy  Lungs: clear to auscultation bilaterally, no rales, wheezes, or rhonci  Heart: regular rate and rhythm  Abdomen: round, soft, non tender, positive bowel sounds  Musculoskeletal: No focal spinal tenderness, no peripheral edema  Neuro: non focal, well oriented, positive affect  Breasts: deferred   LAB RESULTS:  CMP     Component Value Date/Time  NA 141 09/30/2014 0824   NA 138 04/03/2014 1059   K 4.4 09/30/2014 0824   K 4.2 04/03/2014 1059   CL 101 04/03/2014 1059   CO2 31* 09/30/2014 0824   CO2 33* 04/03/2014 1059   GLUCOSE 106 09/30/2014 0824   GLUCOSE 97 04/03/2014 1059   BUN 14.8 09/30/2014 0824   BUN 15 04/03/2014 1059   CREATININE 0.8 09/30/2014 0824   CREATININE 0.73 04/03/2014 1059   CREATININE 0.66 06/08/2013 1016   CALCIUM 9.9 09/30/2014 0824   CALCIUM 9.9 04/03/2014 1059   PROT 6.6 09/30/2014 0824   PROT 7.4 04/03/2014 1059   ALBUMIN 3.9 09/30/2014 0824   ALBUMIN 4.5 04/03/2014 1059   AST 18 09/30/2014 0824   AST 20 04/03/2014 1059   ALT 21 09/30/2014 0824   ALT 13 04/03/2014 1059   ALKPHOS 47 09/30/2014 0824   ALKPHOS 40 04/03/2014 1059   BILITOT 0.34 09/30/2014 0824   BILITOT 0.5 04/03/2014 1059    INo results found for: SPEP, UPEP  Lab Results  Component Value Date   WBC 3.7* 09/30/2014   NEUTROABS 2.3 09/30/2014   HGB 14.3 09/30/2014   HCT 42.6 09/30/2014   MCV  99.5 09/30/2014   PLT 188 09/30/2014      Chemistry      Component Value Date/Time   NA 141 09/30/2014 0824   NA 138 04/03/2014 1059   K 4.4 09/30/2014 0824   K 4.2 04/03/2014 1059   CL 101 04/03/2014 1059   CO2 31* 09/30/2014 0824   CO2 33* 04/03/2014 1059   BUN 14.8 09/30/2014 0824   BUN 15 04/03/2014 1059   CREATININE 0.8 09/30/2014 0824   CREATININE 0.73 04/03/2014 1059   CREATININE 0.66 06/08/2013 1016      Component Value Date/Time   CALCIUM 9.9 09/30/2014 0824   CALCIUM 9.9 04/03/2014 1059   ALKPHOS 47 09/30/2014 0824   ALKPHOS 40 04/03/2014 1059   AST 18 09/30/2014 0824   AST 20 04/03/2014 1059   ALT 21 09/30/2014 0824   ALT 13 04/03/2014 1059   BILITOT 0.34 09/30/2014 0824   BILITOT 0.5 04/03/2014 1059       No results found for: LABCA2  No components found for: LABCA125  No results for input(s): INR in the last 168 hours.  Urinalysis    Component Value Date/Time   COLORURINE YELLOW 04/03/2014 1059   APPEARANCEUR CLEAR 04/03/2014 1059   LABSPEC <=1.005* 04/03/2014 1059   PHURINE 7.0 04/03/2014 1059   GLUCOSEU NEGATIVE 04/03/2014 1059   GLUCOSEU NEG 06/08/2013 1016   HGBUR TRACE-INTACT* 04/03/2014 1059   BILIRUBINUR NEGATIVE 04/03/2014 1059   KETONESUR NEGATIVE 04/03/2014 1059   PROTEINUR NEG 06/08/2013 1016   UROBILINOGEN 0.2 04/03/2014 1059   NITRITE NEGATIVE 04/03/2014 1059   LEUKOCYTESUR NEGATIVE 04/03/2014 1059    STUDIES: No results found.  ASSESSMENT: 64 y.o. [place] woman  (1) status post left lumpectomy and sentinel lymph node sampling 1999 for a T1(mic) N0, stage IA invasive breast cancer,  (a) status post adjuvant radiation  (b) status post tamoxifen for 5 years  (2) status post left breast biopsy 07/04/2014 for a clinical T1a N0, stage IA invasive ductal carcinoma, triple positive, with an MIB-1 of 80%  (3) status post left mastectomy and sentinel lymph node sampling 08/06/2014 for a pT1a pN0, stage IA invasive ductal  carcinoma, grade 2, with negative margins. There was insufficient tissue to repeat HER-2 testing  (4) adjuvant chemotherapy started 09/16/2014 consisting of paclitaxel weekly 12  with trastuzumab given every 3 weeks, continuing the trastuzumab alone to complete 1 year  (a) echo 09/17/2014 shows a normal ejection fraction  (5) anastrozole to start once Taxol chemotherapy completed  (a) osteopenia, with a T score of -1.4 on bone density scan 11/12/2013  (6) genetics testing pending  (7) tobacco abuse: The patient has been strongly advised to quit  PLAN: Ebone is managing treatment well so far. The labs were reviewed in detail and were entirely stable. She will proceed with cycle 3 of paclitaxel as planned today.   I have encouraged her to push fluids. We are also removing zofran from her home antiemetics, which may be contributing to her headaches. We will leave the premed dose where it is at 59m for now.   BTreawill return in 1 week for cycle 4 of treatment. She understands and agrees with this plan. She knows the goal of treatment in her case is cure. She has been encouraged to call with any issues that might arise before her next visit here.   HLaurie [name] NP   09/30/2014 9:35 AM

## 2014-10-01 ENCOUNTER — Encounter: Payer: Self-pay | Admitting: Oncology

## 2014-10-02 ENCOUNTER — Ambulatory Visit (HOSPITAL_BASED_OUTPATIENT_CLINIC_OR_DEPARTMENT_OTHER): Payer: 59 | Admitting: Genetic Counselor

## 2014-10-02 ENCOUNTER — Encounter: Payer: Self-pay | Admitting: Genetic Counselor

## 2014-10-02 ENCOUNTER — Encounter: Payer: Self-pay | Admitting: Internal Medicine

## 2014-10-02 ENCOUNTER — Other Ambulatory Visit: Payer: 59

## 2014-10-02 DIAGNOSIS — Z803 Family history of malignant neoplasm of breast: Secondary | ICD-10-CM

## 2014-10-02 DIAGNOSIS — Z853 Personal history of malignant neoplasm of breast: Secondary | ICD-10-CM

## 2014-10-02 DIAGNOSIS — Z8049 Family history of malignant neoplasm of other genital organs: Secondary | ICD-10-CM

## 2014-10-02 DIAGNOSIS — C50912 Malignant neoplasm of unspecified site of left female breast: Secondary | ICD-10-CM

## 2014-10-02 NOTE — Progress Notes (Signed)
REFERRING Anastacio Bua: Rowe Clack, MD [address] [place], [place] [address]   Lurline Del, MD  PRIMARY Caiya Bettes:  Gwendolyn Grant, MD  PRIMARY REASON FOR VISIT:  1. History of left breast cancer   2. Primary cancer of left female breast   3. Family history of breast cancer   4. Family history of uterine cancer      HISTORY OF PRESENT ILLNESS:   Tanya [name], a 64 y.o. female, was seen for a [facility] cancer genetics consultation at the request of Dr. Jana Hakim due to a personal and family history of cancer.  Tanya [name] presents to clinic today to discuss the possibility of a hereditary predisposition to cancer, genetic testing, and to further clarify her future cancer risks, as well as potential cancer risks for family members.   In 1999, at the age of 24, Tanya [name] was diagnosed with invasive ductal carcinoma of the left breast. This was treated with lumpectomy, radiation and tamoxifen.  In 2016, at the age of 73, Tanya [name] was diagnosed with invasive ductal carcinoma.  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  No genetic testing has ever been performed.   CANCER HISTORY:   No history exists.     HORMONAL RISK FACTORS:  Menarche was at age 57.  First live birth at age N/A.  OCP use for approximately 3 years.  Ovaries intact: yes.  Hysterectomy: no.  Menopausal status: postmenopausal.  HRT use: 0 years. Colonoscopy: yes; normal. Mammogram within the last year: yes. Number of breast biopsies: 4. Up to date with pelvic exams:  yes. Any excessive radiation exposure in the past:  Treatment for breast cancer  Past Medical History  Diagnosis Date  . COPD, mild   . Smoker   . Osteopenia     DEXA 10/27/11: -1.4, no change from 10/2009  . Shingles   . Breast cancer     Radiation and lumpectomy  . Family history of breast cancer   . Family history of uterine cancer     Past Surgical History  Procedure Laterality Date  . Tonsillectomy and  adenoidectomy    . Dilation and evacuation    . Breast lumpectomy    . Breast biopsy      Social History   Social History  . Marital Status: Married    Spouse Name: N/A  . Number of Children: 0  . Years of Education: N/A   Social History Main Topics  . Smoking status: Current Every Day Smoker -- 1.00 packs/day for 30 years    Types: Cigarettes  . Smokeless tobacco: Never Used     Comment: Trying to smoke less.   . Alcohol Use: 8.4 oz/week    14 Standard drinks or equivalent per week     Comment: 5 DRINKS A WEEK  . Drug Use: No  . Sexual Activity: No   Other Topics Concern  . None   Social History Narrative   Lives at home with husband who is disabled.       FAMILY HISTORY:  We obtained a detailed, 4-generation family history.  Significant diagnoses are listed below: Family History  Problem Relation Age of Onset  . CAD Mother     stent older age  . Hypertension Mother   . Breast cancer Mother 55  . Cancer Mother     UTERINE AND SKIN  . Heart disease Paternal Grandfather   . Lung cancer Paternal Grandfather   . Hypertension Father   .  Heart disease Father 29    CHF   . Dementia Father   . Hearing loss Maternal Grandfather   . Parkinson's disease Paternal Grandmother    The patient does not have children.  She has a brother and sister who are cancer free. Her mother was diagnosed with uterine cancer in her 74s and breast cancer in her 61s.  Her mother had one sister who did not have cancer.  Tanya [name] father was an only child.  His father had lung cancer.  There is no other reported family history of cancer.  Patient's maternal ancestors are of [place] descent, and paternal ancestors are of [place], [place] and [place] descent. There is no reported Ashkenazi Jewish ancestry. There is no known consanguinity.  GENETIC COUNSELING ASSESSMENT: Tanya [name] is a 64 y.o. female with a personal history of two primary breast cancers and a family history of breast and uterine  cancer which somewhat suggestive of a hereditary cancer syndrome and predisposition to cancer. We, therefore, discussed and recommended the following at today's visit.   DISCUSSION: We discussed that about 5-10% of breast cancer is hereditary.  The most common hereditary cause are BRCA mutations.  We discussed that her family history is not fully consistent with BRCA mutations, however, her family history on her father's side of the family is limited.  We reviewed the characteristics, features and inheritance patterns of hereditary cancer syndromes. We also discussed genetic testing, including the appropriate family members to test, the process of testing, insurance coverage and turn-around-time for results. We discussed the implications of a negative, positive and/or variant of uncertain significant result. We recommended Tanya [name] pursue genetic testing for the Breast/Ovarian cancer gene panel. The Breast/Ovarian gene panel offered by [facility] includes sequencing and rearrangement analysis for the following 20 genes:  ATM, BARD1, BRCA1, BRCA2, BRIP1, CDH1, CHEK2, EPCAM, FANCC, MLH1, MSH2, MSH6, NBN, PALB2, PMS2, PTEN, RAD51C, RAD51D, TP53, and XRCC2.     Based on Ms. Bastidas's personal and family history of cancer, she meets medical criteria for genetic testing. Despite that she meets criteria, she may still have an out of pocket cost. We discussed that if her out of pocket cost for testing is over $100, the laboratory will call and confirm whether she wants to proceed with testing.  If the out of pocket cost of testing is less than $100 she will be billed by the genetic testing laboratory. In an attempt to consolidate blood draws, we will put tubes in the lab for her blood draw on Monday, October 07, 2014.  PLAN: After considering the risks, benefits, and limitations, Tanya [name]  provided informed consent to pursue genetic testing and the blood sample was sent to [facility] for analysis of the  Breast/Ovarian cancer panel. Results should be available within approximately 2-3 weeks' time, at which point they will be disclosed by telephone to Tanya [name], as will any additional recommendations warranted by these results. Tanya [name] will receive a summary of her genetic counseling visit and a copy of her results once available. This information will also be available in Epic. We encouraged Tanya [name] to remain in contact with cancer genetics annually so that we can continuously update the family history and inform her of any changes in cancer genetics and testing that may be of benefit for her family. Tanya [name] questions were answered to her satisfaction today. Our contact information was provided should additional questions or concerns arise.  Lastly, we encouraged Tanya [name] to remain in  contact with cancer genetics annually so that we can continuously update the family history and inform her of any changes in cancer genetics and testing that may be of benefit for this family.   Ms.  [name] questions were answered to her satisfaction today. Our contact information was provided should additional questions or concerns arise. Thank you for the referral and allowing [place] to share in the care of your patient.   Karen P. Florene Glen, [place], [facility] Certified Genetic Counselor [email or web address] phone: [phone]  The patient was seen for a total of 30 minutes in face-to-face genetic counseling.  This patient was discussed with Drs. [name], Lindi Adie and/or Burr Medico who agrees with the above.    _______________________________________________________________________ For Office Staff:  Number of people involved in session: 1 Was an Intern/ student involved with case: no

## 2014-10-03 NOTE — Telephone Encounter (Signed)
While i am "technically" the PCP, I have not seen pt since 2014 - AVP saw her 04/03/14 for "well visit" per records Please forward pt concern and request to Billing and/or manager for evaluation Then let patient know we are doing same thanks

## 2014-10-07 ENCOUNTER — Other Ambulatory Visit: Payer: 59

## 2014-10-07 ENCOUNTER — Other Ambulatory Visit: Payer: Self-pay

## 2014-10-07 ENCOUNTER — Encounter: Payer: Self-pay | Admitting: Nurse Practitioner

## 2014-10-07 ENCOUNTER — Ambulatory Visit (HOSPITAL_BASED_OUTPATIENT_CLINIC_OR_DEPARTMENT_OTHER): Payer: 59 | Admitting: Nurse Practitioner

## 2014-10-07 ENCOUNTER — Ambulatory Visit: Payer: 59

## 2014-10-07 ENCOUNTER — Ambulatory Visit (HOSPITAL_BASED_OUTPATIENT_CLINIC_OR_DEPARTMENT_OTHER): Payer: 59

## 2014-10-07 ENCOUNTER — Other Ambulatory Visit (HOSPITAL_BASED_OUTPATIENT_CLINIC_OR_DEPARTMENT_OTHER): Payer: 59

## 2014-10-07 VITALS — BP 115/64 | HR 76 | Temp 97.4°F | Resp 18 | Ht 64.75 in | Wt 119.1 lb

## 2014-10-07 DIAGNOSIS — C50912 Malignant neoplasm of unspecified site of left female breast: Secondary | ICD-10-CM

## 2014-10-07 DIAGNOSIS — Z720 Tobacco use: Secondary | ICD-10-CM | POA: Diagnosis not present

## 2014-10-07 DIAGNOSIS — Z5111 Encounter for antineoplastic chemotherapy: Secondary | ICD-10-CM | POA: Diagnosis not present

## 2014-10-07 LAB — CBC WITH DIFFERENTIAL/PLATELET
BASO%: 0.5 % (ref 0.0–2.0)
Basophils Absolute: 0 10*3/uL (ref 0.0–0.1)
EOS%: 1.4 % (ref 0.0–7.0)
Eosinophils Absolute: 0.1 10*3/uL (ref 0.0–0.5)
HCT: 41.2 % (ref 34.8–46.6)
HEMOGLOBIN: 14 g/dL (ref 11.6–15.9)
LYMPH#: 1.4 10*3/uL (ref 0.9–3.3)
LYMPH%: 33.2 % (ref 14.0–49.7)
MCH: 33.7 pg (ref 25.1–34.0)
MCHC: 33.9 g/dL (ref 31.5–36.0)
MCV: 99.4 fL (ref 79.5–101.0)
MONO#: 0.3 10*3/uL (ref 0.1–0.9)
MONO%: 6.1 % (ref 0.0–14.0)
NEUT%: 58.8 % (ref 38.4–76.8)
NEUTROS ABS: 2.4 10*3/uL (ref 1.5–6.5)
PLATELETS: 195 10*3/uL (ref 145–400)
RBC: 4.14 10*6/uL (ref 3.70–5.45)
RDW: 12.9 % (ref 11.2–14.5)
WBC: 4.1 10*3/uL (ref 3.9–10.3)

## 2014-10-07 LAB — COMPREHENSIVE METABOLIC PANEL (CC13)
ALBUMIN: 4 g/dL (ref 3.5–5.0)
ALT: 25 U/L (ref 0–55)
ANION GAP: 8 meq/L (ref 3–11)
AST: 21 U/L (ref 5–34)
Alkaline Phosphatase: 43 U/L (ref 40–150)
BILIRUBIN TOTAL: 0.35 mg/dL (ref 0.20–1.20)
BUN: 13.5 mg/dL (ref 7.0–26.0)
CO2: 27 meq/L (ref 22–29)
CREATININE: 0.8 mg/dL (ref 0.6–1.1)
Calcium: 9.4 mg/dL (ref 8.4–10.4)
Chloride: 105 mEq/L (ref 98–109)
EGFR: 82 mL/min/{1.73_m2} — ABNORMAL LOW (ref >90)
GLUCOSE: 111 mg/dL (ref 70–140)
Potassium: 4.4 mEq/L (ref 3.5–5.1)
Sodium: 140 mEq/L (ref 136–145)
TOTAL PROTEIN: 6.7 g/dL (ref 6.4–8.3)

## 2014-10-07 MED ORDER — PACLITAXEL CHEMO INJECTION 300 MG/50ML
80.0000 mg/m2 | Freq: Once | INTRAVENOUS | Status: AC
  Administered 2014-10-07: 126 mg via INTRAVENOUS
  Filled 2014-10-07: qty 21

## 2014-10-07 MED ORDER — SODIUM CHLORIDE 0.9 % IV SOLN
Freq: Once | INTRAVENOUS | Status: AC
  Administered 2014-10-07: 12:00:00 via INTRAVENOUS

## 2014-10-07 MED ORDER — FAMOTIDINE IN NACL 20-0.9 MG/50ML-% IV SOLN
INTRAVENOUS | Status: AC
  Filled 2014-10-07: qty 50

## 2014-10-07 MED ORDER — SODIUM CHLORIDE 0.9 % IV SOLN
Freq: Once | INTRAVENOUS | Status: AC
  Administered 2014-10-07: 12:00:00 via INTRAVENOUS
  Filled 2014-10-07: qty 2

## 2014-10-07 MED ORDER — FAMOTIDINE IN NACL 20-0.9 MG/50ML-% IV SOLN
20.0000 mg | Freq: Once | INTRAVENOUS | Status: AC
  Administered 2014-10-07: 20 mg via INTRAVENOUS

## 2014-10-07 NOTE — Patient Instructions (Signed)
Cancer Center Discharge Instructions for Patients Receiving Chemotherapy  Today you received the following chemotherapy agents: Taxol  To help prevent nausea and vomiting after your treatment, we encourage you to take your nausea medication as prescribed by your physician.   If you develop nausea and vomiting that is not controlled by your nausea medication, call the clinic.   BELOW ARE SYMPTOMS THAT SHOULD BE REPORTED IMMEDIATELY:  *FEVER GREATER THAN 100.5 F  *CHILLS WITH OR WITHOUT FEVER  NAUSEA AND VOMITING THAT IS NOT CONTROLLED WITH YOUR NAUSEA MEDICATION  *UNUSUAL SHORTNESS OF BREATH  *UNUSUAL BRUISING OR BLEEDING  TENDERNESS IN MOUTH AND THROAT WITH OR WITHOUT PRESENCE OF ULCERS  *URINARY PROBLEMS  *BOWEL PROBLEMS  UNUSUAL RASH Items with * indicate a potential emergency and should be followed up as soon as possible.  Feel free to call the clinic you have any questions or concerns. The clinic phone number is [phone].  Please show the CHEMO ALERT CARD at check-in to the Emergency Department and triage nurse.   

## 2014-10-07 NOTE — Progress Notes (Signed)
[facility]  [phone] [phone]     ID: LEOCADIA IDLEMAN DOB: 08-29-1950  MR#: [id number]  CHE#:[id number]  Patient Care Team: Rowe Clack, MD as PCP - General [name], MD (Gastroenterology) [name], MD (Obstetrics and Gynecology) Chauncey Cruel, MD as Consulting Physician (Oncology) [name], MD as Referring Physician (Surgery) PCP: Gwendolyn Grant, MD OTHER MD: [name] MD, [name] MD  CHIEF COMPLAINT: Estrogen receptor positive breast cancer  CURRENT TREATMENT: Paclitaxel plus trastuzumab, to be followed by anastrozole   BREAST CANCER HISTORY: From the original intake note:  At the age of 64, the patient underwent lumpectomy and sentinel lymph node biopsy for a left sided T1(mic) N0, stage IA invasive breast cancer. She completed whole breast irradiation and received tamoxifen for 5 years. She has continued on raloxifene.  More recently, screening mammography at the breast Center with tomography 06/19/2014 showed calcifications in the left breast, which were further evaluated with diagnostic left mammography 06/26/2014. Breast density was category C. There was a 5 mm group of heterogeneous calcifications in the outer left breast. This was biopsied 07/04/2014, and showed ([id number]) invasive and in situ ductal carcinoma, grade 2. There was a minimal amount of invasive tumor, but it did allow for prognostic panel, which showed the cancer to be estrogen receptor 90% positive, progesterone receptor 80% positive, both with strong staining intensity, with an MIB-1 of 80%, and HER-2 amplified, with a signals ratio of 8.09, the number per cell being 8.90.  The patient then was evaluated at [facility] by Dr. Elisha Headland and, given the prior history of radiation, mastectomy with repeat sentinel lymph node sampling was suggested. This was performed 08/08/2014, and showed ([id number]) invasive ductal  carcinoma, grade 1, measuring 0.3 cm. Margins were negative. All 3 sentinel lymph nodes sampled were negative. Repeat prognostic panel was obtained on the DCIS only, and found the tumor to be estrogen receptor positive at 65% and progesterone receptor positive at 75%.  The patient's subsequent history is as detailed below.   INTERVAL HISTORY: Tanya [name] returns today for follow-up of her breast cancer. Today is day 62 cycle 5 of her weekly paclitaxel, with trastuzumab given every 3 weeks (next due on 10/10).   REVIEW OF SYSTEMS: Tanya [name] has no new complaints today. She denies fevers, chills, nausea, or vomiting. She is less constipated and may back off from using her bran cereals now. Her appetite is good. She denies mouth sores, rashes, or neuropathy symptoms. Her hair is beginning to fall out. She denies shortness of breath, chest pain, cough, or palpitations. She is having headaches every other day. A detailed review of systems is otherwise stable.   PAST MEDICAL HISTORY: Past Medical History  Diagnosis Date  . COPD, mild   . Smoker   . Osteopenia     DEXA 10/27/11: -1.4, no change from 10/2009  . Shingles   . Breast cancer     Radiation and lumpectomy  . Family history of breast cancer   . Family history of uterine cancer     PAST SURGICAL HISTORY: Past Surgical History  Procedure Laterality Date  . Tonsillectomy and adenoidectomy    . Dilation and evacuation    . Breast lumpectomy    . Breast biopsy      FAMILY HISTORY Family History  Problem Relation Age of Onset  . CAD Mother     stent older age  . Hypertension Mother   . Breast cancer Mother  65  . Cancer Mother     UTERINE AND SKIN  . Heart disease Paternal Grandfather   . Lung cancer Paternal Grandfather   . Hypertension Father   . Heart disease Father 32    CHF   . Dementia Father   . Hearing loss Maternal Grandfather   . Parkinson's disease Paternal Grandmother    the patient's father died with Alzheimer's  disease at age 32. The patient's mother was diagnosed with breast cancer at age 89. She died from C. difficile colitis at age 61. The patient has one brother, one sister. There is no history of breast or ovarian cancer in the immediate family.  GYNECOLOGIC HISTORY:  No LMP recorded. Patient is postmenopausal. Menarche age 56, the patient is GX P0. She underwent menopause approximately the year 2000. She did not take hormone replacement. 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 Her husband, [name], used to be in Press photographer (Set designer) but now has had 2 back fusions and is disabled. At home is just the 2 of them plus her dog Max   ADVANCED DIRECTIVES: In place   HEALTH MAINTENANCE: Social History  Substance Use Topics  . Smoking status: Current Every Day Smoker -- 1.00 packs/day for 30 years    Types: Cigarettes  . Smokeless tobacco: Never Used     Comment: Trying to smoke less.   . Alcohol Use: 8.4 oz/week    14 Standard drinks or equivalent per week     Comment: 5 DRINKS A WEEK     Colonoscopy: November 2009  PAP:  Bone density: November 2015:  Lipid panel:  Allergies  Allergen Reactions  . Codeine     REACTION: Rash  . Tetanus Toxoid     REACTION: Shock    Current Outpatient Prescriptions  Medication Sig Dispense Refill  . Calcium Carbonate-Vitamin D (CALTRATE 600+D) 600-400 MG-UNIT per tablet Take 1 tablet by mouth daily.    . Cholecalciferol (VITAMIN D-1000 MAX ST) 1000 UNITS tablet Take 1,000 Units by mouth.    . Misc Natural Products (OSTEO BI-FLEX TRIPLE STRENGTH) TABS Take 1 tablet by mouth 2 (two) times daily.    . ondansetron (ZOFRAN) 8 MG tablet Take 1 tablet (8 mg total) by mouth 2 (two) times daily as needed for nausea or vomiting. 20 tablet 1  . prochlorperazine (COMPAZINE) 10 MG tablet Take 1 tablet (10 mg total) by mouth every  6 (six) hours as needed for nausea or vomiting. 30 tablet 1  . ibuprofen (MOTRIN IB) 200 MG tablet Take 1 tablet (200 mg total) by mouth every 6 (six) hours as needed. 30 tablet 0  . LORazepam (ATIVAN) 0.5 MG tablet Take 1 tablet (0.5 mg total) by mouth every 8 (eight) hours. (Patient not taking: Reported on 09/30/2014) 30 tablet 0   No current facility-administered medications for this visit.    OBJECTIVE: Middle-aged white woman Filed Vitals:   10/07/14 0913  BP: 115/64  Pulse: 76  Temp: 97.4 F (36.3 C)  Resp: 18     Body mass index is 19.96 kg/(m^2).      ECOG FS:1 - Symptomatic but completely ambulatory  Sclerae unicteric, pupils round and equal Oropharynx clear and moist-- no thrush or other lesions No cervical or supraclavicular adenopathy Lungs no rales or rhonchi Heart regular rate and rhythm Abd soft, nontender, positive bowel sounds MSK no focal  spinal tenderness, no upper extremity lymphedema Neuro: nonfocal, well oriented, appropriate affect Breasts: deferred  LAB RESULTS:  CMP     Component Value Date/Time   NA 140 10/07/2014 0859   NA 138 04/03/2014 1059   K 4.4 10/07/2014 0859   K 4.2 04/03/2014 1059   CL 101 04/03/2014 1059   CO2 27 10/07/2014 0859   CO2 33* 04/03/2014 1059   GLUCOSE 111 10/07/2014 0859   GLUCOSE 97 04/03/2014 1059   BUN 13.5 10/07/2014 0859   BUN 15 04/03/2014 1059   CREATININE 0.8 10/07/2014 0859   CREATININE 0.73 04/03/2014 1059   CREATININE 0.66 06/08/2013 1016   CALCIUM 9.4 10/07/2014 0859   CALCIUM 9.9 04/03/2014 1059   PROT 6.7 10/07/2014 0859   PROT 7.4 04/03/2014 1059   ALBUMIN 4.0 10/07/2014 0859   ALBUMIN 4.5 04/03/2014 1059   AST 21 10/07/2014 0859   AST 20 04/03/2014 1059   ALT 25 10/07/2014 0859   ALT 13 04/03/2014 1059   ALKPHOS 43 10/07/2014 0859   ALKPHOS 40 04/03/2014 1059   BILITOT 0.35 10/07/2014 0859   BILITOT 0.5 04/03/2014 1059    INo results found for: SPEP, UPEP  Lab Results  Component Value  Date   WBC 4.1 10/07/2014   NEUTROABS 2.4 10/07/2014   HGB 14.0 10/07/2014   HCT 41.2 10/07/2014   MCV 99.4 10/07/2014   PLT 195 10/07/2014      Chemistry      Component Value Date/Time   NA 140 10/07/2014 0859   NA 138 04/03/2014 1059   K 4.4 10/07/2014 0859   K 4.2 04/03/2014 1059   CL 101 04/03/2014 1059   CO2 27 10/07/2014 0859   CO2 33* 04/03/2014 1059   BUN 13.5 10/07/2014 0859   BUN 15 04/03/2014 1059   CREATININE 0.8 10/07/2014 0859   CREATININE 0.73 04/03/2014 1059   CREATININE 0.66 06/08/2013 1016      Component Value Date/Time   CALCIUM 9.4 10/07/2014 0859   CALCIUM 9.9 04/03/2014 1059   ALKPHOS 43 10/07/2014 0859   ALKPHOS 40 04/03/2014 1059   AST 21 10/07/2014 0859   AST 20 04/03/2014 1059   ALT 25 10/07/2014 0859   ALT 13 04/03/2014 1059   BILITOT 0.35 10/07/2014 0859   BILITOT 0.5 04/03/2014 1059       No results found for: LABCA2  No components found for: LABCA125  No results for input(s): INR in the last 168 hours.  Urinalysis    Component Value Date/Time   COLORURINE YELLOW 04/03/2014 1059   APPEARANCEUR CLEAR 04/03/2014 1059   LABSPEC <=1.005* 04/03/2014 1059   PHURINE 7.0 04/03/2014 1059   GLUCOSEU NEGATIVE 04/03/2014 1059   GLUCOSEU NEG 06/08/2013 1016   HGBUR TRACE-INTACT* 04/03/2014 1059   BILIRUBINUR NEGATIVE 04/03/2014 1059   KETONESUR NEGATIVE 04/03/2014 1059   PROTEINUR NEG 06/08/2013 1016   UROBILINOGEN 0.2 04/03/2014 1059   NITRITE NEGATIVE 04/03/2014 1059   LEUKOCYTESUR NEGATIVE 04/03/2014 1059    STUDIES: No results found.  ASSESSMENT: 64 y.o. [place] woman  (1) status post left lumpectomy and sentinel lymph node sampling 1999 for a T1(mic) N0, stage IA invasive breast cancer,  (a) status post adjuvant radiation  (b) status post tamoxifen for 5 years  (2) status post left breast biopsy 07/04/2014 for a clinical T1a N0, stage IA invasive ductal carcinoma, triple positive, with an MIB-1 of 80%  (3) status post  left mastectomy and sentinel lymph node sampling 08/06/2014 for a pT1a pN0, stage  IA invasive ductal carcinoma, grade 2, with negative margins. There was insufficient tissue to repeat HER-2 testing  (4) adjuvant chemotherapy started 09/16/2014 consisting of paclitaxel weekly 12 with trastuzumab given every 3 weeks, continuing the trastuzumab alone to complete 1 year  (a) echo 09/17/2014 shows a normal ejection fraction  (5) anastrozole to start once Taxol chemotherapy completed  (a) osteopenia, with a T score of -1.4 on bone density scan 11/12/2013  (6) genetics testing pending  (7) tobacco abuse: The patient has been strongly advised to quit  PLAN: Tifini is disappointed about losing her hair, but is doing well otherwise. The labs were reviewed in detail and were entirely normal. She will proceed with cycle 4 of paclitaxel as planned today.   I have written her a prescription for a wig during this visit, but I also suggested looking into caps or scarves.  Winifred will return in 1 week for cycle 5 of treatment. She understands and agrees with this plan. She knows the goal of treatment in her case is cure. She has been encouraged to call with any issues that might arise before her next visit here.   Laurie [name], NP   10/07/2014 11:13 AM

## 2014-10-08 ENCOUNTER — Other Ambulatory Visit: Payer: Self-pay | Admitting: Nurse Practitioner

## 2014-10-09 ENCOUNTER — Encounter: Payer: Self-pay | Admitting: Oncology

## 2014-10-11 ENCOUNTER — Other Ambulatory Visit: Payer: Self-pay

## 2014-10-11 DIAGNOSIS — C50912 Malignant neoplasm of unspecified site of left female breast: Secondary | ICD-10-CM

## 2014-10-11 NOTE — Telephone Encounter (Signed)
Reminder to self:   Recheck the dx code for the labs that were entered.

## 2014-10-14 ENCOUNTER — Other Ambulatory Visit (HOSPITAL_BASED_OUTPATIENT_CLINIC_OR_DEPARTMENT_OTHER): Payer: 59

## 2014-10-14 ENCOUNTER — Ambulatory Visit (HOSPITAL_BASED_OUTPATIENT_CLINIC_OR_DEPARTMENT_OTHER): Payer: 59

## 2014-10-14 ENCOUNTER — Ambulatory Visit (HOSPITAL_BASED_OUTPATIENT_CLINIC_OR_DEPARTMENT_OTHER): Payer: 59 | Admitting: Nurse Practitioner

## 2014-10-14 ENCOUNTER — Encounter: Payer: Self-pay | Admitting: Nurse Practitioner

## 2014-10-14 ENCOUNTER — Telehealth: Payer: Self-pay | Admitting: Nurse Practitioner

## 2014-10-14 ENCOUNTER — Ambulatory Visit: Payer: 59

## 2014-10-14 VITALS — BP 119/64 | HR 76 | Temp 98.1°F | Resp 18 | Ht 64.75 in | Wt 117.5 lb

## 2014-10-14 DIAGNOSIS — C50912 Malignant neoplasm of unspecified site of left female breast: Secondary | ICD-10-CM

## 2014-10-14 DIAGNOSIS — Z5111 Encounter for antineoplastic chemotherapy: Secondary | ICD-10-CM | POA: Diagnosis not present

## 2014-10-14 DIAGNOSIS — Z720 Tobacco use: Secondary | ICD-10-CM | POA: Diagnosis not present

## 2014-10-14 LAB — CBC WITH DIFFERENTIAL/PLATELET
BASO%: 1 % (ref 0.0–2.0)
Basophils Absolute: 0 10*3/uL (ref 0.0–0.1)
EOS%: 1.6 % (ref 0.0–7.0)
Eosinophils Absolute: 0.1 10*3/uL (ref 0.0–0.5)
HCT: 42.9 % (ref 34.8–46.6)
HEMOGLOBIN: 14.5 g/dL (ref 11.6–15.9)
LYMPH%: 39.2 % (ref 14.0–49.7)
MCH: 33.8 pg (ref 25.1–34.0)
MCHC: 33.9 g/dL (ref 31.5–36.0)
MCV: 99.9 fL (ref 79.5–101.0)
MONO#: 0.2 10*3/uL (ref 0.1–0.9)
MONO%: 5.7 % (ref 0.0–14.0)
NEUT%: 52.5 % (ref 38.4–76.8)
NEUTROS ABS: 2.1 10*3/uL (ref 1.5–6.5)
Platelets: 165 10*3/uL (ref 145–400)
RBC: 4.29 10*6/uL (ref 3.70–5.45)
RDW: 13.1 % (ref 11.2–14.5)
WBC: 4 10*3/uL (ref 3.9–10.3)
lymph#: 1.6 10*3/uL (ref 0.9–3.3)

## 2014-10-14 LAB — COMPREHENSIVE METABOLIC PANEL (CC13)
ALBUMIN: 4.3 g/dL (ref 3.5–5.0)
ALK PHOS: 47 U/L (ref 40–150)
ALT: 24 U/L (ref 0–55)
ANION GAP: 10 meq/L (ref 3–11)
AST: 22 U/L (ref 5–34)
BILIRUBIN TOTAL: 0.41 mg/dL (ref 0.20–1.20)
BUN: 14.2 mg/dL (ref 7.0–26.0)
CO2: 27 meq/L (ref 22–29)
Calcium: 9.7 mg/dL (ref 8.4–10.4)
Chloride: 103 mEq/L (ref 98–109)
Creatinine: 0.8 mg/dL (ref 0.6–1.1)
EGFR: 76 mL/min/{1.73_m2} — AB (ref >90)
Glucose: 113 mg/dl (ref 70–140)
Potassium: 4.1 mEq/L (ref 3.5–5.1)
Sodium: 140 mEq/L (ref 136–145)
TOTAL PROTEIN: 7.1 g/dL (ref 6.4–8.3)

## 2014-10-14 MED ORDER — FAMOTIDINE IN NACL 20-0.9 MG/50ML-% IV SOLN
INTRAVENOUS | Status: AC
  Filled 2014-10-14: qty 50

## 2014-10-14 MED ORDER — FAMOTIDINE IN NACL 20-0.9 MG/50ML-% IV SOLN
20.0000 mg | Freq: Once | INTRAVENOUS | Status: AC
  Administered 2014-10-14: 20 mg via INTRAVENOUS

## 2014-10-14 MED ORDER — ACETAMINOPHEN 325 MG PO TABS
ORAL_TABLET | ORAL | Status: AC
  Filled 2014-10-14: qty 2

## 2014-10-14 MED ORDER — ACETAMINOPHEN 325 MG PO TABS
650.0000 mg | ORAL_TABLET | Freq: Once | ORAL | Status: AC
  Administered 2014-10-14: 650 mg via ORAL

## 2014-10-14 MED ORDER — PACLITAXEL CHEMO INJECTION 300 MG/50ML
80.0000 mg/m2 | Freq: Once | INTRAVENOUS | Status: AC
  Administered 2014-10-14: 126 mg via INTRAVENOUS
  Filled 2014-10-14: qty 21

## 2014-10-14 MED ORDER — SODIUM CHLORIDE 0.9 % IV SOLN
6.0000 mg/kg | Freq: Once | INTRAVENOUS | Status: AC
  Administered 2014-10-14: 315 mg via INTRAVENOUS
  Filled 2014-10-14: qty 15

## 2014-10-14 MED ORDER — SODIUM CHLORIDE 0.9 % IV SOLN
Freq: Once | INTRAVENOUS | Status: AC
  Administered 2014-10-14: 11:00:00 via INTRAVENOUS
  Filled 2014-10-14: qty 2

## 2014-10-14 MED ORDER — SODIUM CHLORIDE 0.9 % IV SOLN
Freq: Once | INTRAVENOUS | Status: AC
  Administered 2014-10-14: 10:00:00 via INTRAVENOUS

## 2014-10-14 NOTE — Telephone Encounter (Signed)
Appointments made and avs will be printed in chemo  °

## 2014-10-14 NOTE — Patient Instructions (Signed)
[facility] Discharge Instructions for Patients Receiving Chemotherapy  Today you received the following chemotherapy agents :  Taxol,  Herceptin.  To help prevent nausea and vomiting after your treatment, we encourage you to take your nausea medication as prescribed.   If you develop nausea and vomiting that is not controlled by your nausea medication, call the clinic.   BELOW ARE SYMPTOMS THAT SHOULD BE REPORTED IMMEDIATELY:  *FEVER GREATER THAN 100.5 F  *CHILLS WITH OR WITHOUT FEVER  NAUSEA AND VOMITING THAT IS NOT CONTROLLED WITH YOUR NAUSEA MEDICATION  *UNUSUAL SHORTNESS OF BREATH  *UNUSUAL BRUISING OR BLEEDING  TENDERNESS IN MOUTH AND THROAT WITH OR WITHOUT PRESENCE OF ULCERS  *URINARY PROBLEMS  *BOWEL PROBLEMS  UNUSUAL RASH Items with * indicate a potential emergency and should be followed up as soon as possible.  Feel free to call the clinic you have any questions or concerns. The clinic phone number is [phone].  Please show the [place] at check-in to the Emergency Department and triage nurse.

## 2014-10-14 NOTE — Progress Notes (Signed)
[place]  [phone] Fax:(336) [phone]     ID: Tanya [name] DOB: Jan 12, 1950  MR#: [id number]  RXV#:[id number]  Patient Care Team: Rowe Clack, MD as PCP - General [name], MD (Gastroenterology) [name], MD (Obstetrics and Gynecology) Chauncey Cruel, MD as Consulting Physician (Oncology) [name], MD as Referring Physician (Surgery) PCP: Gwendolyn Grant, MD OTHER MD: [name] MD, [name] MD  CHIEF COMPLAINT: Estrogen receptor positive breast cancer  CURRENT TREATMENT: Paclitaxel plus trastuzumab, to be followed by anastrozole   BREAST CANCER HISTORY: From the original intake note:  At the age of 64, the patient underwent lumpectomy and sentinel lymph node biopsy for a left sided T1(mic) N0, stage IA invasive breast cancer. She completed whole breast irradiation and received tamoxifen for 5 years. She has continued on raloxifene.  More recently, screening mammography at the breast Center with tomography 06/19/2014 showed calcifications in the left breast, which were further evaluated with diagnostic left mammography 06/26/2014. Breast density was category C. There was a 5 mm group of heterogeneous calcifications in the outer left breast. This was biopsied 07/04/2014, and showed ([id number]) invasive and in situ ductal carcinoma, grade 2. There was a minimal amount of invasive tumor, but it did allow for prognostic panel, which showed the cancer to be estrogen receptor 90% positive, progesterone receptor 80% positive, both with strong staining intensity, with an MIB-1 of 80%, and HER-2 amplified, with a signals ratio of 8.09, the number per cell being 8.90.  The patient then was evaluated at [facility] by Dr. Elisha Headland and, given the prior history of radiation, mastectomy with repeat sentinel lymph node sampling was suggested. This was performed 08/08/2014, and showed ([id number]) invasive ductal  carcinoma, grade 1, measuring 0.3 cm. Margins were negative. All 3 sentinel lymph nodes sampled were negative. Repeat prognostic panel was obtained on the DCIS only, and found the tumor to be estrogen receptor positive at 65% and progesterone receptor positive at 75%.  The patient's subsequent history is as detailed below.   INTERVAL HISTORY: Tanya [name] returns today for follow-up of her breast cancer. Today is day 1 cycle 6 of her weekly paclitaxel, with trastuzumab given every 3 weeks, which is due today.   REVIEW OF SYSTEMS: [name] continues to lose hair, and has bought a new wig that compliments her nicely. She denies fevers, chills, nausea, or vomiting. Her constipation is resolved. She is eating and drinking well. She denies mouth sores, rashes, or neuropathy symptoms. Her tongue is just sensitive with acidic foods. She is experiencing some sinus drainage, due to the shift in weather. She has occasional headaches. A detailed review of systems is otherwise stable.  PAST MEDICAL HISTORY: Past Medical History  Diagnosis Date  . COPD, mild ([place])   . Smoker   . Osteopenia     DEXA 10/27/11: -1.4, no change from 10/2009  . Shingles   . Breast cancer ([place])     Radiation and lumpectomy  . Family history of breast cancer   . Family history of uterine cancer     PAST SURGICAL HISTORY: Past Surgical History  Procedure Laterality Date  . Tonsillectomy and adenoidectomy    . Dilation and evacuation    . Breast lumpectomy    . Breast biopsy      FAMILY HISTORY Family History  Problem Relation Age of Onset  . CAD Mother     stent older age  . Hypertension Mother   . Breast  cancer Mother 83  . Cancer Mother     UTERINE AND SKIN  . Heart disease Paternal Grandfather   . Lung cancer Paternal Grandfather   . Hypertension Father   . Heart disease Father 65    CHF   . Dementia Father   . Hearing loss Maternal Grandfather   . Parkinson's disease Paternal Grandmother    the patient's  father died with Alzheimer's disease at age 58. The patient's mother was diagnosed with breast cancer at age 66. She died from C. difficile colitis at age 4. The patient has one brother, one sister. There is no history of breast or ovarian cancer in the immediate family.  GYNECOLOGIC HISTORY:  No LMP recorded. Patient is postmenopausal. Menarche age 72, the patient is GX P0. She underwent menopause approximately the year 2000. She did not take hormone replacement. 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 Her husband, [name], used to be in Press photographer (Set designer) but now has had 2 back fusions and is disabled. At home is just the 2 of them plus her dog Max   ADVANCED DIRECTIVES: In place   HEALTH MAINTENANCE: Social History  Substance Use Topics  . Smoking status: Current Every Day Smoker -- 1.00 packs/day for 30 years    Types: Cigarettes  . Smokeless tobacco: Never Used     Comment: Trying to smoke less.   . Alcohol Use: 8.4 oz/week    14 Standard drinks or equivalent per week     Comment: 5 DRINKS A WEEK     Colonoscopy: November 2009  PAP:  Bone density: November 2015:  Lipid panel:  Allergies  Allergen Reactions  . Codeine     REACTION: Rash  . Tetanus Toxoid     REACTION: Shock    Current Outpatient Prescriptions  Medication Sig Dispense Refill  . Calcium Carbonate-Vitamin D 600-400 MG-UNIT tablet Take 1 tablet by mouth 2 (two) times daily.    . Cholecalciferol (VITAMIN D-1000 MAX ST) 1000 UNITS tablet Take 1,000 Units by mouth.    Marland Kitchen ibuprofen (MOTRIN IB) 200 MG tablet Take 1 tablet (200 mg total) by mouth every 6 (six) hours as needed. 30 tablet 0  . LORazepam (ATIVAN) 0.5 MG tablet Take 1 tablet (0.5 mg total) by mouth every 8 (eight) hours. (Patient not taking: Reported on 09/30/2014) 30 tablet 0  . Misc Natural Products (OSTEO BI-FLEX  TRIPLE STRENGTH) TABS Take 1 tablet by mouth 2 (two) times daily.    . ondansetron (ZOFRAN) 8 MG tablet Take 1 tablet (8 mg total) by mouth 2 (two) times daily as needed for nausea or vomiting. 20 tablet 1  . prochlorperazine (COMPAZINE) 10 MG tablet Take 1 tablet (10 mg total) by mouth every 6 (six) hours as needed for nausea or vomiting. 30 tablet 1   No current facility-administered medications for this visit.    OBJECTIVE: Middle-aged white woman Filed Vitals:   10/14/14 0914  BP: 119/64  Pulse: 76  Temp: 98.1 F (36.7 C)  Resp: 18     Body mass index is 19.7 kg/(m^2).      ECOG FS:1 - Symptomatic but completely ambulatory  Skin: warm, dry  HEENT: sclerae anicteric, conjunctivae pink, oropharynx clear. No thrush or mucositis.  Lymph Nodes: No cervical or supraclavicular lymphadenopathy  Lungs: clear to auscultation bilaterally, no rales, wheezes, or rhonci  Heart: regular  rate and rhythm  Abdomen: round, soft, non tender, positive bowel sounds  Musculoskeletal: No focal spinal tenderness, no peripheral edema  Neuro: non focal, well oriented, positive affect  Breasts: deferred  LAB RESULTS:  CMP     Component Value Date/Time   NA 140 10/14/2014 0820   NA 138 04/03/2014 1059   K 4.1 10/14/2014 0820   K 4.2 04/03/2014 1059   CL 101 04/03/2014 1059   CO2 27 10/14/2014 0820   CO2 33* 04/03/2014 1059   GLUCOSE 113 10/14/2014 0820   GLUCOSE 97 04/03/2014 1059   BUN 14.2 10/14/2014 0820   BUN 15 04/03/2014 1059   CREATININE 0.8 10/14/2014 0820   CREATININE 0.73 04/03/2014 1059   CREATININE 0.66 06/08/2013 1016   CALCIUM 9.7 10/14/2014 0820   CALCIUM 9.9 04/03/2014 1059   PROT 7.1 10/14/2014 0820   PROT 7.4 04/03/2014 1059   ALBUMIN 4.3 10/14/2014 0820   ALBUMIN 4.5 04/03/2014 1059   AST 22 10/14/2014 0820   AST 20 04/03/2014 1059   ALT 24 10/14/2014 0820   ALT 13 04/03/2014 1059   ALKPHOS 47 10/14/2014 0820   ALKPHOS 40 04/03/2014 1059   BILITOT 0.41 10/14/2014  0820   BILITOT 0.5 04/03/2014 1059    INo results found for: SPEP, UPEP  Lab Results  Component Value Date   WBC 4.0 10/14/2014   NEUTROABS 2.1 10/14/2014   HGB 14.5 10/14/2014   HCT 42.9 10/14/2014   MCV 99.9 10/14/2014   PLT 165 10/14/2014      Chemistry      Component Value Date/Time   NA 140 10/14/2014 0820   NA 138 04/03/2014 1059   K 4.1 10/14/2014 0820   K 4.2 04/03/2014 1059   CL 101 04/03/2014 1059   CO2 27 10/14/2014 0820   CO2 33* 04/03/2014 1059   BUN 14.2 10/14/2014 0820   BUN 15 04/03/2014 1059   CREATININE 0.8 10/14/2014 0820   CREATININE 0.73 04/03/2014 1059   CREATININE 0.66 06/08/2013 1016      Component Value Date/Time   CALCIUM 9.7 10/14/2014 0820   CALCIUM 9.9 04/03/2014 1059   ALKPHOS 47 10/14/2014 0820   ALKPHOS 40 04/03/2014 1059   AST 22 10/14/2014 0820   AST 20 04/03/2014 1059   ALT 24 10/14/2014 0820   ALT 13 04/03/2014 1059   BILITOT 0.41 10/14/2014 0820   BILITOT 0.5 04/03/2014 1059       No results found for: LABCA2  No components found for: LABCA125  No results for input(s): INR in the last 168 hours.  Urinalysis    Component Value Date/Time   COLORURINE YELLOW 04/03/2014 1059   APPEARANCEUR CLEAR 04/03/2014 1059   LABSPEC <=1.005* 04/03/2014 1059   PHURINE 7.0 04/03/2014 1059   GLUCOSEU NEGATIVE 04/03/2014 1059   GLUCOSEU NEG 06/08/2013 1016   HGBUR TRACE-INTACT* 04/03/2014 1059   BILIRUBINUR NEGATIVE 04/03/2014 1059   KETONESUR NEGATIVE 04/03/2014 1059   PROTEINUR NEG 06/08/2013 1016   UROBILINOGEN 0.2 04/03/2014 1059   NITRITE NEGATIVE 04/03/2014 1059   LEUKOCYTESUR NEGATIVE 04/03/2014 1059    STUDIES: No results found.  ASSESSMENT: 64 y.o. [place] woman  (1) status post left lumpectomy and sentinel lymph node sampling 1999 for a T1(mic) N0, stage IA invasive breast cancer,  (a) status post adjuvant radiation  (b) status post tamoxifen for 5 years  (2) status post left breast biopsy 07/04/2014 for a  clinical T1a N0, stage IA invasive ductal carcinoma, triple positive, with an MIB-1 of  80%  (3) status post left mastectomy and sentinel lymph node sampling 08/06/2014 for a pT1a pN0, stage IA invasive ductal carcinoma, grade 2, with negative margins. There was insufficient tissue to repeat HER-2 testing  (4) adjuvant chemotherapy started 09/16/2014 consisting of paclitaxel weekly 12 with trastuzumab given every 3 weeks, continuing the trastuzumab alone to complete 1 year  (a) echo 09/17/2014 shows a normal ejection fraction  (5) anastrozole to start once Taxol chemotherapy completed  (a) osteopenia, with a T score of -1.4 on bone density scan 11/12/2013  (6) genetics testing pending  (7) tobacco abuse: The patient has been strongly advised to quit  PLAN: Sokha looks and feels well today. She has no complaints to offer. The labs were reviewed in detail and were entirely normal. She will proceed with cycle 5 of paclitaxel as planned today along with cycle 2 of herceptin.   Alianys will return in 1 week for cycle 6 of treatment. She understands and agrees with this plan. She knows the goal of treatment in her case is cure. She has been encouraged to call with any issues that might arise before her next visit here.   Laurie [name], NP   10/14/2014 9:35 AM

## 2014-10-21 ENCOUNTER — Ambulatory Visit (HOSPITAL_BASED_OUTPATIENT_CLINIC_OR_DEPARTMENT_OTHER): Payer: 59

## 2014-10-21 ENCOUNTER — Other Ambulatory Visit (HOSPITAL_BASED_OUTPATIENT_CLINIC_OR_DEPARTMENT_OTHER): Payer: 59

## 2014-10-21 ENCOUNTER — Ambulatory Visit (HOSPITAL_BASED_OUTPATIENT_CLINIC_OR_DEPARTMENT_OTHER): Payer: 59 | Admitting: Nurse Practitioner

## 2014-10-21 ENCOUNTER — Ambulatory Visit: Payer: 59

## 2014-10-21 ENCOUNTER — Encounter: Payer: Self-pay | Admitting: Nurse Practitioner

## 2014-10-21 VITALS — BP 118/53 | HR 79 | Temp 97.6°F | Resp 18 | Ht 64.75 in | Wt 119.2 lb

## 2014-10-21 DIAGNOSIS — Z5111 Encounter for antineoplastic chemotherapy: Secondary | ICD-10-CM | POA: Diagnosis not present

## 2014-10-21 DIAGNOSIS — Z853 Personal history of malignant neoplasm of breast: Secondary | ICD-10-CM | POA: Diagnosis not present

## 2014-10-21 DIAGNOSIS — C50912 Malignant neoplasm of unspecified site of left female breast: Secondary | ICD-10-CM

## 2014-10-21 DIAGNOSIS — Z170 Estrogen receptor positive status [ER+]: Secondary | ICD-10-CM

## 2014-10-21 DIAGNOSIS — Z9012 Acquired absence of left breast and nipple: Secondary | ICD-10-CM

## 2014-10-21 DIAGNOSIS — Z923 Personal history of irradiation: Secondary | ICD-10-CM

## 2014-10-21 DIAGNOSIS — Z79810 Long term (current) use of selective estrogen receptor modulators (SERMs): Secondary | ICD-10-CM

## 2014-10-21 LAB — CBC WITH DIFFERENTIAL/PLATELET
BASO%: 0.8 % (ref 0.0–2.0)
BASOS ABS: 0 10*3/uL (ref 0.0–0.1)
EOS ABS: 0.1 10*3/uL (ref 0.0–0.5)
EOS%: 1.6 % (ref 0.0–7.0)
HEMATOCRIT: 40.3 % (ref 34.8–46.6)
HEMOGLOBIN: 13.7 g/dL (ref 11.6–15.9)
LYMPH%: 35.8 % (ref 14.0–49.7)
MCH: 33.9 pg (ref 25.1–34.0)
MCHC: 34 g/dL (ref 31.5–36.0)
MCV: 99.8 fL (ref 79.5–101.0)
MONO#: 0.2 10*3/uL (ref 0.1–0.9)
MONO%: 5.8 % (ref 0.0–14.0)
NEUT%: 56 % (ref 38.4–76.8)
NEUTROS ABS: 2.1 10*3/uL (ref 1.5–6.5)
PLATELETS: 182 10*3/uL (ref 145–400)
RBC: 4.04 10*6/uL (ref 3.70–5.45)
RDW: 13.7 % (ref 11.2–14.5)
WBC: 3.8 10*3/uL — AB (ref 3.9–10.3)
lymph#: 1.4 10*3/uL (ref 0.9–3.3)

## 2014-10-21 LAB — COMPREHENSIVE METABOLIC PANEL (CC13)
ALBUMIN: 4.2 g/dL (ref 3.5–5.0)
ALK PHOS: 45 U/L (ref 40–150)
ALT: 22 U/L (ref 0–55)
ANION GAP: 8 meq/L (ref 3–11)
AST: 21 U/L (ref 5–34)
BILIRUBIN TOTAL: 0.35 mg/dL (ref 0.20–1.20)
BUN: 15.2 mg/dL (ref 7.0–26.0)
CALCIUM: 9.8 mg/dL (ref 8.4–10.4)
CO2: 29 mEq/L (ref 22–29)
Chloride: 103 mEq/L (ref 98–109)
Creatinine: 0.8 mg/dL (ref 0.6–1.1)
EGFR: 84 mL/min/{1.73_m2} — AB (ref >90)
GLUCOSE: 97 mg/dL (ref 70–140)
POTASSIUM: 4.3 meq/L (ref 3.5–5.1)
Sodium: 140 mEq/L (ref 136–145)
TOTAL PROTEIN: 7 g/dL (ref 6.4–8.3)

## 2014-10-21 MED ORDER — FAMOTIDINE IN NACL 20-0.9 MG/50ML-% IV SOLN
INTRAVENOUS | Status: AC
  Filled 2014-10-21: qty 50

## 2014-10-21 MED ORDER — FAMOTIDINE IN NACL 20-0.9 MG/50ML-% IV SOLN
20.0000 mg | Freq: Once | INTRAVENOUS | Status: AC
  Administered 2014-10-21: 20 mg via INTRAVENOUS

## 2014-10-21 MED ORDER — PACLITAXEL CHEMO INJECTION 300 MG/50ML
80.0000 mg/m2 | Freq: Once | INTRAVENOUS | Status: AC
  Administered 2014-10-21: 126 mg via INTRAVENOUS
  Filled 2014-10-21: qty 21

## 2014-10-21 MED ORDER — SODIUM CHLORIDE 0.9 % IV SOLN
Freq: Once | INTRAVENOUS | Status: AC
  Administered 2014-10-21: 10:00:00 via INTRAVENOUS

## 2014-10-21 MED ORDER — SODIUM CHLORIDE 0.9 % IV SOLN
Freq: Once | INTRAVENOUS | Status: AC
  Administered 2014-10-21: 10:00:00 via INTRAVENOUS
  Filled 2014-10-21: qty 2

## 2014-10-21 NOTE — Patient Instructions (Signed)
[facility] Discharge Instructions for Patients Receiving Chemotherapy  Today you received the following chemotherapy agents: Taxol  To help prevent nausea and vomiting after your treatment, we encourage you to take your nausea medication as prescribed by your physician.   If you develop nausea and vomiting that is not controlled by your nausea medication, call the clinic.   BELOW ARE SYMPTOMS THAT SHOULD BE REPORTED IMMEDIATELY:  *FEVER GREATER THAN 100.5 F  *CHILLS WITH OR WITHOUT FEVER  NAUSEA AND VOMITING THAT IS NOT CONTROLLED WITH YOUR NAUSEA MEDICATION  *UNUSUAL SHORTNESS OF BREATH  *UNUSUAL BRUISING OR BLEEDING  TENDERNESS IN MOUTH AND THROAT WITH OR WITHOUT PRESENCE OF ULCERS  *URINARY PROBLEMS  *BOWEL PROBLEMS  UNUSUAL RASH Items with * indicate a potential emergency and should be followed up as soon as possible.  Feel free to call the clinic you have any questions or concerns. The clinic phone number is [phone].  Please show the CHEMO ALERT CARD at check-in to the Emergency Department and triage nurse.   

## 2014-10-21 NOTE — Progress Notes (Signed)
[place]  Telephone:(336) [phone] Fax:(336) [phone]     ID: Tanya [name] DOB: 24-Dec-1950  MR#: [id number]  GBE#:[id number]  Patient Care Team: Rowe Clack, MD as PCP - General [name], MD (Gastroenterology) [name], MD (Obstetrics and Gynecology) Chauncey Cruel, MD as Consulting Physician (Oncology) [name], MD as Referring Physician (Surgery) PCP: Gwendolyn Grant, MD OTHER MD: [name] MD, [name] MD  CHIEF COMPLAINT: Estrogen receptor positive breast cancer  CURRENT TREATMENT: Paclitaxel plus trastuzumab, to be followed by anastrozole   BREAST CANCER HISTORY: From the original intake note:  At the age of 17, the patient underwent lumpectomy and sentinel lymph node biopsy for a left sided T1(mic) N0, stage IA invasive breast cancer. She completed whole breast irradiation and received tamoxifen for 5 years. She has continued on raloxifene.  More recently, screening mammography at the breast Center with tomography 06/19/2014 showed calcifications in the left breast, which were further evaluated with diagnostic left mammography 06/26/2014. Breast density was category C. There was a 5 mm group of heterogeneous calcifications in the outer left breast. This was biopsied 07/04/2014, and showed ([id number]) invasive and in situ ductal carcinoma, grade 2. There was a minimal amount of invasive tumor, but it did allow for prognostic panel, which showed the cancer to be estrogen receptor 90% positive, progesterone receptor 80% positive, both with strong staining intensity, with an MIB-1 of 80%, and HER-2 amplified, with a signals ratio of 8.09, the number per cell being 8.90.  The patient then was evaluated at [facility] by Dr. Elisha Headland and, given the prior history of radiation, mastectomy with repeat sentinel lymph node sampling was suggested. This was performed 08/08/2014, and showed ([id number]) invasive ductal  carcinoma, grade 1, measuring 0.3 cm. Margins were negative. All 3 sentinel lymph nodes sampled were negative. Repeat prognostic panel was obtained on the DCIS only, and found the tumor to be estrogen receptor positive at 65% and progesterone receptor positive at 75%.  The patient's subsequent history is as detailed below.   INTERVAL HISTORY: Tanya [name] returns today for follow-up of her breast cancer. Today is day 1 cycle 6 of her weekly paclitaxel, with trastuzumab given every 3 weeks (next due 10/31).   REVIEW OF SYSTEMS: Tanya [name] has no new complaints to offer. She had 1 day of loose stools after eating something that didn't agree with her. She denies fevers, chills, nausea, or vomiting. She has no mouth sores, rashes, or neuropathy symptoms. Her scalp is itchy but not painful. Her sinus drainage is resolving. Her fatigue is minimal. A detailed review of systems is otherwise stable.  PAST MEDICAL HISTORY: Past Medical History  Diagnosis Date  . COPD, mild ([place])   . Smoker   . Osteopenia     DEXA 10/27/11: -1.4, no change from 10/2009  . Shingles   . Breast cancer ([place])     Radiation and lumpectomy  . Family history of breast cancer   . Family history of uterine cancer     PAST SURGICAL HISTORY: Past Surgical History  Procedure Laterality Date  . Tonsillectomy and adenoidectomy    . Dilation and evacuation    . Breast lumpectomy    . Breast biopsy      FAMILY HISTORY Family History  Problem Relation Age of Onset  . CAD Mother     stent older age  . Hypertension Mother   . Breast cancer Mother 74  . Cancer Mother  UTERINE AND SKIN  . Heart disease Paternal Grandfather   . Lung cancer Paternal Grandfather   . Hypertension Father   . Heart disease Father 26    CHF   . Dementia Father   . Hearing loss Maternal Grandfather   . Parkinson's disease Paternal Grandmother    the patient's father died with Alzheimer's disease at age 14. The patient's mother was diagnosed  with breast cancer at age 49. She died from C. difficile colitis at age 81. The patient has one brother, one sister. There is no history of breast or ovarian cancer in the immediate family.  GYNECOLOGIC HISTORY:  No LMP recorded. Patient is postmenopausal. Menarche age 40, the patient is GX P0. She underwent menopause approximately the year 2000. She did not take hormone replacement. 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 Her husband, [name], used to be in Press photographer (Set designer) but now has had 2 back fusions and is disabled. At home is just the 2 of them plus her dog Max   ADVANCED DIRECTIVES: In place   HEALTH MAINTENANCE: Social History  Substance Use Topics  . Smoking status: Current Every Day Smoker -- 1.00 packs/day for 30 years    Types: Cigarettes  . Smokeless tobacco: Never Used     Comment: Trying to smoke less.   . Alcohol Use: 8.4 oz/week    14 Standard drinks or equivalent per week     Comment: 5 DRINKS A WEEK     Colonoscopy: November 2009  PAP:  Bone density: November 2015:  Lipid panel:  Allergies  Allergen Reactions  . Codeine     REACTION: Rash  . Tetanus Toxoid     REACTION: Shock    Current Outpatient Prescriptions  Medication Sig Dispense Refill  . Calcium Carbonate-Vitamin D 600-400 MG-UNIT tablet Take 1 tablet by mouth 2 (two) times daily.    . Cholecalciferol (VITAMIN D-1000 MAX ST) 1000 UNITS tablet Take 1,000 Units by mouth.    Marland Kitchen ibuprofen (MOTRIN IB) 200 MG tablet Take 1 tablet (200 mg total) by mouth every 6 (six) hours as needed. 30 tablet 0  . Misc Natural Products (OSTEO BI-FLEX TRIPLE STRENGTH) TABS Take 1 tablet by mouth 2 (two) times daily.    . prochlorperazine (COMPAZINE) 10 MG tablet Take 1 tablet (10 mg total) by mouth every 6 (six) hours as needed for nausea or vomiting. 30 tablet 1  . LORazepam  (ATIVAN) 0.5 MG tablet Take 1 tablet (0.5 mg total) by mouth every 8 (eight) hours. (Patient not taking: Reported on 09/30/2014) 30 tablet 0  . ondansetron (ZOFRAN) 8 MG tablet Take 1 tablet (8 mg total) by mouth 2 (two) times daily as needed for nausea or vomiting. (Patient not taking: Reported on 10/21/2014) 20 tablet 1   No current facility-administered medications for this visit.    OBJECTIVE: Middle-aged white woman Filed Vitals:   10/21/14 0902  BP: 118/53  Pulse: 79  Temp: 97.6 F (36.4 C)  Resp: 18     Body mass index is 19.99 kg/(m^2).      ECOG FS:1 - Symptomatic but completely ambulatory  Sclerae unicteric, pupils round and equal Oropharynx clear and moist-- no thrush or other lesions No cervical or supraclavicular adenopathy Lungs no rales or rhonchi Heart regular rate and rhythm Abd soft, nontender, positive bowel sounds MSK no focal spinal tenderness, no  upper extremity lymphedema Neuro: nonfocal, well oriented, appropriate affect Breasts: deferred  LAB RESULTS:  CMP     Component Value Date/Time   NA 140 10/21/2014 0844   NA 138 04/03/2014 1059   K 4.3 10/21/2014 0844   K 4.2 04/03/2014 1059   CL 101 04/03/2014 1059   CO2 29 10/21/2014 0844   CO2 33* 04/03/2014 1059   GLUCOSE 97 10/21/2014 0844   GLUCOSE 97 04/03/2014 1059   BUN 15.2 10/21/2014 0844   BUN 15 04/03/2014 1059   CREATININE 0.8 10/21/2014 0844   CREATININE 0.73 04/03/2014 1059   CREATININE 0.66 06/08/2013 1016   CALCIUM 9.8 10/21/2014 0844   CALCIUM 9.9 04/03/2014 1059   PROT 7.0 10/21/2014 0844   PROT 7.4 04/03/2014 1059   ALBUMIN 4.2 10/21/2014 0844   ALBUMIN 4.5 04/03/2014 1059   AST 21 10/21/2014 0844   AST 20 04/03/2014 1059   ALT 22 10/21/2014 0844   ALT 13 04/03/2014 1059   ALKPHOS 45 10/21/2014 0844   ALKPHOS 40 04/03/2014 1059   BILITOT 0.35 10/21/2014 0844   BILITOT 0.5 04/03/2014 1059    INo results found for: SPEP, UPEP  Lab Results  Component Value Date   WBC  3.8* 10/21/2014   NEUTROABS 2.1 10/21/2014   HGB 13.7 10/21/2014   HCT 40.3 10/21/2014   MCV 99.8 10/21/2014   PLT 182 10/21/2014      Chemistry      Component Value Date/Time   NA 140 10/21/2014 0844   NA 138 04/03/2014 1059   K 4.3 10/21/2014 0844   K 4.2 04/03/2014 1059   CL 101 04/03/2014 1059   CO2 29 10/21/2014 0844   CO2 33* 04/03/2014 1059   BUN 15.2 10/21/2014 0844   BUN 15 04/03/2014 1059   CREATININE 0.8 10/21/2014 0844   CREATININE 0.73 04/03/2014 1059   CREATININE 0.66 06/08/2013 1016      Component Value Date/Time   CALCIUM 9.8 10/21/2014 0844   CALCIUM 9.9 04/03/2014 1059   ALKPHOS 45 10/21/2014 0844   ALKPHOS 40 04/03/2014 1059   AST 21 10/21/2014 0844   AST 20 04/03/2014 1059   ALT 22 10/21/2014 0844   ALT 13 04/03/2014 1059   BILITOT 0.35 10/21/2014 0844   BILITOT 0.5 04/03/2014 1059       No results found for: LABCA2  No components found for: LABCA125  No results for input(s): INR in the last 168 hours.  Urinalysis    Component Value Date/Time   COLORURINE YELLOW 04/03/2014 1059   APPEARANCEUR CLEAR 04/03/2014 1059   LABSPEC <=1.005* 04/03/2014 1059   PHURINE 7.0 04/03/2014 1059   GLUCOSEU NEGATIVE 04/03/2014 1059   GLUCOSEU NEG 06/08/2013 1016   HGBUR TRACE-INTACT* 04/03/2014 1059   BILIRUBINUR NEGATIVE 04/03/2014 1059   KETONESUR NEGATIVE 04/03/2014 1059   PROTEINUR NEG 06/08/2013 1016   UROBILINOGEN 0.2 04/03/2014 1059   NITRITE NEGATIVE 04/03/2014 1059   LEUKOCYTESUR NEGATIVE 04/03/2014 1059    STUDIES: No results found.  ASSESSMENT: 64 y.o. [place] woman  (1) status post left lumpectomy and sentinel lymph node sampling 1999 for a T1(mic) N0, stage IA invasive breast cancer,  (a) status post adjuvant radiation  (b) status post tamoxifen for 5 years  (2) status post left breast biopsy 07/04/2014 for a clinical T1a N0, stage IA invasive ductal carcinoma, triple positive, with an MIB-1 of 80%  (3) status post left  mastectomy and sentinel lymph node sampling 08/06/2014 for a pT1a pN0, stage IA invasive ductal  carcinoma, grade 2, with negative margins. There was insufficient tissue to repeat HER-2 testing  (4) adjuvant chemotherapy started 09/16/2014 consisting of paclitaxel weekly 12 with trastuzumab given every 3 weeks, continuing the trastuzumab alone to complete 1 year  (a) echo 09/17/2014 shows a normal ejection fraction  (5) anastrozole to start once Taxol chemotherapy completed  (a) osteopenia, with a T score of -1.4 on bone density scan 11/12/2013  (6) genetics testing pending  (7) tobacco abuse: The patient has been strongly advised to quit  PLAN: Joey continues to tolerate treatment remarkably well. She has no complaints to offer. The labs were reviewed in detail and were entirely normal. She will proceed with cycle 6 of paclitaxel as planned today.   She was interested in the results of her genetic testing, but they have not returned yet.  Sayde will return in 1 week for cycle 7 of treatment. She understands and agrees with this plan. She knows the goal of treatment in her case is cure. She has been encouraged to call with any issues that might arise before her next visit here.   Laurie [name], NP   10/21/2014 9:46 AM

## 2014-10-23 ENCOUNTER — Telehealth: Payer: Self-pay | Admitting: Genetic Counselor

## 2014-10-23 ENCOUNTER — Ambulatory Visit: Payer: Self-pay | Admitting: Genetic Counselor

## 2014-10-23 ENCOUNTER — Other Ambulatory Visit: Payer: Self-pay | Admitting: Nurse Practitioner

## 2014-10-23 ENCOUNTER — Encounter: Payer: Self-pay | Admitting: Genetic Counselor

## 2014-10-23 DIAGNOSIS — Z853 Personal history of malignant neoplasm of breast: Secondary | ICD-10-CM

## 2014-10-23 DIAGNOSIS — Z1379 Encounter for other screening for genetic and chromosomal anomalies: Secondary | ICD-10-CM

## 2014-10-23 DIAGNOSIS — Z0000 Encounter for general adult medical examination without abnormal findings: Secondary | ICD-10-CM | POA: Insufficient documentation

## 2014-10-23 DIAGNOSIS — Z803 Family history of malignant neoplasm of breast: Secondary | ICD-10-CM

## 2014-10-23 DIAGNOSIS — C50912 Malignant neoplasm of unspecified site of left female breast: Secondary | ICD-10-CM

## 2014-10-23 DIAGNOSIS — Z8049 Family history of malignant neoplasm of other genital organs: Secondary | ICD-10-CM

## 2014-10-23 NOTE — Progress Notes (Signed)
HPI: Ms. Howes was previously seen in the [place] clinic due to a personal and family history of cancer and concerns regarding a hereditary predisposition to cancer. Please refer to our prior cancer genetics clinic note for more information regarding Ms. Kulesza's medical, social and family histories, and our assessment and recommendations, at the time. Ms. Birkel recent genetic test results were disclosed to her, as were recommendations warranted by these results. These results and recommendations are discussed in more detail below.  FAMILY HISTORY:  We obtained a detailed, 4-generation family history.  Significant diagnoses are listed below: Family History  Problem Relation Age of Onset  . CAD Mother     stent older age  . Hypertension Mother   . Breast cancer Mother 71  . Cancer Mother     UTERINE AND SKIN  . Heart disease Paternal Grandfather   . Lung cancer Paternal Grandfather   . Hypertension Father   . Heart disease Father 93    CHF   . Dementia Father   . Hearing loss Maternal Grandfather   . Parkinson's disease Paternal Grandmother     The patient does not have children. She has a brother and sister who are cancer free. Her mother was diagnosed with uterine cancer in her 57s and breast cancer in her 67s. Her mother had one sister who did not have cancer. Ms. Avis father was an only child. His father had lung cancer. There is no other reported family history of cancer. Patient's maternal ancestors are of [place] descent, and paternal ancestors are of [place], [place] and [place] descent. There is no reported Ashkenazi Jewish ancestry. There is no known consanguinity.  GENETIC TEST RESULTS: At the time of Ms. Degrave's visit, we recommended she pursue genetic testing of the Breast/Ovarian cancer gene panel. The Breast/Ovarian gene panel offered by [facility] includes sequencing and rearrangement analysis for the following 20 genes:  ATM, BARD1, BRCA1, BRCA2, BRIP1,  CDH1, CHEK2, EPCAM, FANCC, MLH1, MSH2, MSH6, NBN, PALB2, PMS2, PTEN, RAD51C, RAD51D, TP53, and XRCC2.   The report date is October 22, 2014.  Genetic testing was normal, and did not reveal a deleterious mutation in these genes. The test report has been scanned into EPIC and is located under the Molecular Pathology section of the Results Review tab. The patient will receive a copy of the result in the mail.  We discussed with Ms. Geissinger that since the current genetic testing is not perfect, it is possible there may be a gene mutation in one of these genes that current testing cannot detect, but that chance is small. We also discussed, that it is possible that another gene that has not yet been discovered, or that we have not yet tested, is responsible for the cancer diagnoses in the family, and it is, therefore, important to remain in touch with cancer genetics in the future so that we can continue to offer Ms. Gingrich the most up to date genetic testing.   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 At this time, it is unknown if this variant is associated with increased cancer risk or if this is a normal finding, but most variants such as this get reclassified to being inconsequential. It should not be used to make medical management decisions. With time, we suspect the lab will determine the significance of this variant, if any. If we do learn more about it, we will try to contact Ms. Rauch to discuss  it further. However, it is important to stay in touch with [place] periodically and keep the address and phone number up to date.   CANCER SCREENING RECOMMENDATIONS: This result is reassuring and indicates that Ms. Early likely does not have an increased risk for a future cancer due to a mutation in one of these genes. This normal test also suggests that Ms. Lizana's cancer was most likely not due to an inherited predisposition associated with one of these genes.   Most cancers happen by chance and this negative test suggests that her cancer falls into this category.  We, therefore, recommended she continue to follow the cancer management and screening guidelines provided by her oncology and primary healthcare provider.   RECOMMENDATIONS FOR FAMILY MEMBERS: Women in this family might be at some increased risk of developing cancer, over the general population risk, simply due to the family history of cancer. We recommended women in this family have a yearly mammogram beginning at age 22, or 34 years younger than the earliest onset of cancer, an an annual clinical breast exam, and perform monthly breast self-exams. Women in this family should also have a gynecological exam as recommended by their primary provider. All family members should have a colonoscopy by age 107.  FOLLOW-UP: Lastly, we discussed with Ms. Balboni that cancer genetics is a rapidly advancing field and it is possible that new genetic tests will be appropriate for her and/or her family members in the future. We encouraged her to remain in contact with cancer genetics on an annual basis so we can update her personal and family histories and let her know of advances in cancer genetics that may benefit this family.   Our contact number was provided. Ms. Doxtater questions were answered to her satisfaction, and she knows she is welcome to call us at anytime with additional questions or concerns.   Roma Kayser, MS, [facility] Certified Genetic Counselor [email or web address]

## 2014-10-23 NOTE — Telephone Encounter (Signed)
Revealed negative genetic testing on the Breast/ovarian cancer panel.  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

## 2014-10-28 ENCOUNTER — Other Ambulatory Visit: Payer: Self-pay | Admitting: Oncology

## 2014-10-28 ENCOUNTER — Encounter: Payer: Self-pay | Admitting: Nurse Practitioner

## 2014-10-28 ENCOUNTER — Ambulatory Visit (HOSPITAL_BASED_OUTPATIENT_CLINIC_OR_DEPARTMENT_OTHER): Payer: 59 | Admitting: Nurse Practitioner

## 2014-10-28 ENCOUNTER — Encounter: Payer: Self-pay | Admitting: *Deleted

## 2014-10-28 ENCOUNTER — Other Ambulatory Visit: Payer: 59

## 2014-10-28 ENCOUNTER — Ambulatory Visit (HOSPITAL_BASED_OUTPATIENT_CLINIC_OR_DEPARTMENT_OTHER): Payer: 59

## 2014-10-28 VITALS — BP 124/67 | HR 82 | Temp 98.1°F | Resp 18 | Ht 60.75 in | Wt 120.2 lb

## 2014-10-28 DIAGNOSIS — Z720 Tobacco use: Secondary | ICD-10-CM | POA: Diagnosis not present

## 2014-10-28 DIAGNOSIS — Z5111 Encounter for antineoplastic chemotherapy: Secondary | ICD-10-CM | POA: Diagnosis not present

## 2014-10-28 DIAGNOSIS — C50912 Malignant neoplasm of unspecified site of left female breast: Secondary | ICD-10-CM

## 2014-10-28 LAB — COMPREHENSIVE METABOLIC PANEL (CC13)
ALBUMIN: 4.1 g/dL (ref 3.5–5.0)
ALK PHOS: 45 U/L (ref 40–150)
ALT: 22 U/L (ref 0–55)
AST: 22 U/L (ref 5–34)
Anion Gap: 8 mEq/L (ref 3–11)
BILIRUBIN TOTAL: 0.35 mg/dL (ref 0.20–1.20)
BUN: 15.4 mg/dL (ref 7.0–26.0)
CALCIUM: 9.9 mg/dL (ref 8.4–10.4)
CO2: 29 mEq/L (ref 22–29)
Chloride: 103 mEq/L (ref 98–109)
Creatinine: 0.8 mg/dL (ref 0.6–1.1)
EGFR: 76 mL/min/{1.73_m2} — AB (ref >90)
GLUCOSE: 97 mg/dL (ref 70–140)
Potassium: 4.5 mEq/L (ref 3.5–5.1)
SODIUM: 141 meq/L (ref 136–145)
TOTAL PROTEIN: 6.8 g/dL (ref 6.4–8.3)

## 2014-10-28 LAB — CBC WITH DIFFERENTIAL/PLATELET
BASO%: 0.6 % (ref 0.0–2.0)
Basophils Absolute: 0 10*3/uL (ref 0.0–0.1)
EOS ABS: 0.1 10*3/uL (ref 0.0–0.5)
EOS%: 1.4 % (ref 0.0–7.0)
HCT: 40.2 % (ref 34.8–46.6)
HEMOGLOBIN: 13.7 g/dL (ref 11.6–15.9)
LYMPH%: 32.7 % (ref 14.0–49.7)
MCH: 34.4 pg — ABNORMAL HIGH (ref 25.1–34.0)
MCHC: 34.1 g/dL (ref 31.5–36.0)
MCV: 100.8 fL (ref 79.5–101.0)
MONO#: 0.3 10*3/uL (ref 0.1–0.9)
MONO%: 6.6 % (ref 0.0–14.0)
NEUT%: 58.7 % (ref 38.4–76.8)
NEUTROS ABS: 2.6 10*3/uL (ref 1.5–6.5)
Platelets: 194 10*3/uL (ref 145–400)
RBC: 3.99 10*6/uL (ref 3.70–5.45)
RDW: 13.5 % (ref 11.2–14.5)
WBC: 4.4 10*3/uL (ref 3.9–10.3)
lymph#: 1.4 10*3/uL (ref 0.9–3.3)

## 2014-10-28 MED ORDER — SODIUM CHLORIDE 0.9 % IV SOLN
Freq: Once | INTRAVENOUS | Status: AC
  Administered 2014-10-28: 10:00:00 via INTRAVENOUS

## 2014-10-28 MED ORDER — FAMOTIDINE IN NACL 20-0.9 MG/50ML-% IV SOLN
20.0000 mg | Freq: Once | INTRAVENOUS | Status: AC
  Administered 2014-10-28: 20 mg via INTRAVENOUS

## 2014-10-28 MED ORDER — FAMOTIDINE IN NACL 20-0.9 MG/50ML-% IV SOLN
INTRAVENOUS | Status: AC
  Filled 2014-10-28: qty 50

## 2014-10-28 MED ORDER — PACLITAXEL CHEMO INJECTION 300 MG/50ML
80.0000 mg/m2 | Freq: Once | INTRAVENOUS | Status: AC
  Administered 2014-10-28: 126 mg via INTRAVENOUS
  Filled 2014-10-28: qty 21

## 2014-10-28 MED ORDER — SODIUM CHLORIDE 0.9 % IV SOLN
Freq: Once | INTRAVENOUS | Status: AC
  Administered 2014-10-28: 10:00:00 via INTRAVENOUS
  Filled 2014-10-28: qty 2

## 2014-10-28 NOTE — Patient Instructions (Signed)
[facility] Discharge Instructions for Patients Receiving Chemotherapy  Today you received the following chemotherapy agents taxol  To help prevent nausea and vomiting after your treatment, we encourage you to take your nausea medication as directed   If you develop nausea and vomiting that is not controlled by your nausea medication, call the clinic.   BELOW ARE SYMPTOMS THAT SHOULD BE REPORTED IMMEDIATELY:  *FEVER GREATER THAN 100.5 F  *CHILLS WITH OR WITHOUT FEVER  NAUSEA AND VOMITING THAT IS NOT CONTROLLED WITH YOUR NAUSEA MEDICATION  *UNUSUAL SHORTNESS OF BREATH  *UNUSUAL BRUISING OR BLEEDING  TENDERNESS IN MOUTH AND THROAT WITH OR WITHOUT PRESENCE OF ULCERS  *URINARY PROBLEMS  *BOWEL PROBLEMS  UNUSUAL RASH Items with * indicate a potential emergency and should be followed up as soon as possible.  Feel free to call the clinic you have any questions or concerns. The clinic phone number is [phone].  

## 2014-10-28 NOTE — Progress Notes (Signed)
Met with pt for treatment. Relate doing well and without complaints Gave contact information and navigation resources.  Denies needs or questions at this time. Encourage pt to call with concerns. Received verbal understanding.

## 2014-10-28 NOTE — Progress Notes (Signed)
[place]  [phone] [phone]     ID: Tanya [name] DOB: [birth year]  MR#: [id number]  EXB#:[id number]  Patient Care Team: Rowe Clack, MD as PCP - General [name], MD (Gastroenterology) [name], MD (Obstetrics and Gynecology) Chauncey Cruel, MD as Consulting Physician (Oncology) [name], MD as Referring Physician (Surgery) PCP: Gwendolyn Grant, MD OTHER MD: [name] MD, [name] MD  CHIEF COMPLAINT: Estrogen receptor positive breast cancer  CURRENT TREATMENT: Paclitaxel plus trastuzumab, to be followed by anastrozole   BREAST CANCER HISTORY: From the original intake note:  At the age of 5, the patient underwent lumpectomy and sentinel lymph node biopsy for a left sided T1(mic) N0, stage IA invasive breast cancer. She completed whole breast irradiation and received tamoxifen for 5 years. She has continued on raloxifene.  More recently, screening mammography at the breast Center with tomography 06/19/2014 showed calcifications in the left breast, which were further evaluated with diagnostic left mammography 06/26/2014. Breast density was category C. There was a 5 mm group of heterogeneous calcifications in the outer left breast. This was biopsied 07/04/2014, and showed ([id number]) invasive and in situ ductal carcinoma, grade 2. There was a minimal amount of invasive tumor, but it did allow for prognostic panel, which showed the cancer to be estrogen receptor 90% positive, progesterone receptor 80% positive, both with strong staining intensity, with an MIB-1 of 80%, and HER-2 amplified, with a signals ratio of 8.09, the number per cell being 8.90.  The patient then was evaluated at [facility] by Dr. Elisha Headland and, given the prior history of radiation, mastectomy with repeat sentinel lymph node sampling was suggested. This was performed 08/08/2014, and showed ([id number]) invasive ductal  carcinoma, grade 1, measuring 0.3 cm. Margins were negative. All 3 sentinel lymph nodes sampled were negative. Repeat prognostic panel was obtained on the DCIS only, and found the tumor to be estrogen receptor positive at 65% and progesterone receptor positive at 75%.  The patient's subsequent history is as detailed below.   INTERVAL HISTORY: Annalis returns today for follow-up of her breast cancer. Today is day 1 cycle 7 of her weekly paclitaxel, with trastuzumab given every 3 weeks (next due 10/31).   REVIEW OF SYSTEMS: Jeannine denies any changes today. She denies fevers, chills, nausea, vomiting, or changes in bowel or bladder habits. She has no mouth sores, rashes, or neuropathy symptoms. She is eating and drinking well. Her fatigue is minimal. A detailed review of systems is otherwise stable.  PAST MEDICAL HISTORY: Past Medical History  Diagnosis Date  . COPD, mild ([place])   . Smoker   . Osteopenia     DEXA 10/27/11: -1.4, no change from 10/2009  . Shingles   . Breast cancer ([place])     Radiation and lumpectomy  . Family history of breast cancer   . Family history of uterine cancer     PAST SURGICAL HISTORY: Past Surgical History  Procedure Laterality Date  . Tonsillectomy and adenoidectomy    . Dilation and evacuation    . Breast lumpectomy    . Breast biopsy      FAMILY HISTORY Family History  Problem Relation Age of Onset  . CAD Mother     stent older age  . Hypertension Mother   . Breast cancer Mother 10  . Cancer Mother     SKIN and BLADDER   . Heart disease Paternal Grandfather   . Lung cancer Paternal Grandfather   .  Hypertension Father   . Heart disease Father 43    CHF   . Dementia Father   . Hearing loss Maternal Grandfather   . Parkinson's disease Paternal Grandmother    the patient's father died with Alzheimer's disease at age 97. The patient's mother was diagnosed with breast cancer at age 73. She died from C. difficile colitis at age 63. The patient  has one brother, one sister. There is no history of breast or ovarian cancer in the immediate family.  GYNECOLOGIC HISTORY:  No LMP recorded. Patient is postmenopausal. Menarche age 36, the patient is GX P0. She underwent menopause approximately the year 2000. She did not take hormone replacement. 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 Her husband, [name], used to be in Press photographer (Set designer) but now has had 2 back fusions and is disabled. At home is just the 2 of them plus her dog Max   ADVANCED DIRECTIVES: In place   HEALTH MAINTENANCE: Social History  Substance Use Topics  . Smoking status: Current Every Day Smoker -- 1.00 packs/day for 30 years    Types: Cigarettes  . Smokeless tobacco: Never Used     Comment: Trying to smoke less.   . Alcohol Use: 8.4 oz/week    14 Standard drinks or equivalent per week     Comment: 5 DRINKS A WEEK     Colonoscopy: November 2009  PAP:  Bone density: November 2015:  Lipid panel:  Allergies  Allergen Reactions  . Codeine     REACTION: Rash  . Tetanus Toxoid     REACTION: Shock    Current Outpatient Prescriptions  Medication Sig Dispense Refill  . Calcium Carbonate-Vitamin D 600-400 MG-UNIT tablet Take 1 tablet by mouth 2 (two) times daily.    . Cholecalciferol (VITAMIN D-1000 MAX ST) 1000 UNITS tablet Take 1,000 Units by mouth.    Marland Kitchen LORazepam (ATIVAN) 0.5 MG tablet Take 1 tablet (0.5 mg total) by mouth every 8 (eight) hours. 30 tablet 0  . Misc Natural Products (OSTEO BI-FLEX TRIPLE STRENGTH) TABS Take 1 tablet by mouth 2 (two) times daily.    Marland Kitchen ibuprofen (MOTRIN IB) 200 MG tablet Take 1 tablet (200 mg total) by mouth every 6 (six) hours as needed. 30 tablet 0  . ondansetron (ZOFRAN) 8 MG tablet Take 1 tablet (8 mg total) by mouth 2 (two) times daily as needed for nausea or vomiting. (Patient not  taking: Reported on 10/21/2014) 20 tablet 1  . prochlorperazine (COMPAZINE) 10 MG tablet Take 1 tablet (10 mg total) by mouth every 6 (six) hours as needed for nausea or vomiting. (Patient not taking: Reported on 10/28/2014) 30 tablet 1   No current facility-administered medications for this visit.   Facility-Administered Medications Ordered in Other Visits  Medication Dose Route Frequency Provider Last Rate Last Dose  . famotidine (PEPCID) IVPB 20 mg premix  20 mg Intravenous Once Chauncey Cruel, MD   20 mg at 10/28/14 0959  . ondansetron (ZOFRAN) 4 mg, dexamethasone (DECADRON) 10 mg in sodium chloride 0.9 % 50 mL IVPB   Intravenous Once Chauncey Cruel, MD      . PACLitaxel (TAXOL) 126 mg in dextrose 5 % 250 mL chemo infusion (</= 30m/m2)  80 mg/m2 (Treatment Plan Actual) Intravenous Once GChauncey Cruel MD        OBJECTIVE: Middle-aged white woman Filed  Vitals:   10/28/14 0911  BP: 124/67  Pulse: 82  Temp: 98.1 F (36.7 C)  Resp: 18     Body mass index is 22.9 kg/(m^2).      ECOG FS:1 - Symptomatic but completely ambulatory  Skin: warm, dry  HEENT: sclerae anicteric, conjunctivae pink, oropharynx clear. No thrush or mucositis.  Lymph Nodes: No cervical or supraclavicular lymphadenopathy  Lungs: clear to auscultation bilaterally, no rales, wheezes, or rhonci  Heart: regular rate and rhythm  Abdomen: round, soft, non tender, positive bowel sounds  Musculoskeletal: No focal spinal tenderness, no peripheral edema  Neuro: non focal, well oriented, positive affect  Breasts: deferred  LAB RESULTS:  CMP     Component Value Date/Time   NA 141 10/28/2014 0900   NA 138 04/03/2014 1059   K 4.5 10/28/2014 0900   K 4.2 04/03/2014 1059   CL 101 04/03/2014 1059   CO2 29 10/28/2014 0900   CO2 33* 04/03/2014 1059   GLUCOSE 97 10/28/2014 0900   GLUCOSE 97 04/03/2014 1059   BUN 15.4 10/28/2014 0900   BUN 15 04/03/2014 1059   CREATININE 0.8 10/28/2014 0900   CREATININE 0.73  04/03/2014 1059   CREATININE 0.66 06/08/2013 1016   CALCIUM 9.9 10/28/2014 0900   CALCIUM 9.9 04/03/2014 1059   PROT 6.8 10/28/2014 0900   PROT 7.4 04/03/2014 1059   ALBUMIN 4.1 10/28/2014 0900   ALBUMIN 4.5 04/03/2014 1059   AST 22 10/28/2014 0900   AST 20 04/03/2014 1059   ALT 22 10/28/2014 0900   ALT 13 04/03/2014 1059   ALKPHOS 45 10/28/2014 0900   ALKPHOS 40 04/03/2014 1059   BILITOT 0.35 10/28/2014 0900   BILITOT 0.5 04/03/2014 1059    INo results found for: SPEP, UPEP  Lab Results  Component Value Date   WBC 4.4 10/28/2014   NEUTROABS 2.6 10/28/2014   HGB 13.7 10/28/2014   HCT 40.2 10/28/2014   MCV 100.8 10/28/2014   PLT 194 10/28/2014      Chemistry      Component Value Date/Time   NA 141 10/28/2014 0900   NA 138 04/03/2014 1059   K 4.5 10/28/2014 0900   K 4.2 04/03/2014 1059   CL 101 04/03/2014 1059   CO2 29 10/28/2014 0900   CO2 33* 04/03/2014 1059   BUN 15.4 10/28/2014 0900   BUN 15 04/03/2014 1059   CREATININE 0.8 10/28/2014 0900   CREATININE 0.73 04/03/2014 1059   CREATININE 0.66 06/08/2013 1016      Component Value Date/Time   CALCIUM 9.9 10/28/2014 0900   CALCIUM 9.9 04/03/2014 1059   ALKPHOS 45 10/28/2014 0900   ALKPHOS 40 04/03/2014 1059   AST 22 10/28/2014 0900   AST 20 04/03/2014 1059   ALT 22 10/28/2014 0900   ALT 13 04/03/2014 1059   BILITOT 0.35 10/28/2014 0900   BILITOT 0.5 04/03/2014 1059       No results found for: LABCA2  No components found for: LABCA125  No results for input(s): INR in the last 168 hours.  Urinalysis    Component Value Date/Time   COLORURINE YELLOW 04/03/2014 1059   APPEARANCEUR CLEAR 04/03/2014 1059   LABSPEC <=1.005* 04/03/2014 1059   PHURINE 7.0 04/03/2014 1059   GLUCOSEU NEGATIVE 04/03/2014 1059   GLUCOSEU NEG 06/08/2013 1016   HGBUR TRACE-INTACT* 04/03/2014 1059   BILIRUBINUR NEGATIVE 04/03/2014 1059   KETONESUR NEGATIVE 04/03/2014 1059   PROTEINUR NEG 06/08/2013 1016   UROBILINOGEN 0.2  04/03/2014 1059   NITRITE NEGATIVE  04/03/2014 1059   LEUKOCYTESUR NEGATIVE 04/03/2014 1059    STUDIES: No results found.  ASSESSMENT: 64 y.o. [place] woman  (1) status post left lumpectomy and sentinel lymph node sampling 1999 for a T1(mic) N0, stage IA invasive breast cancer,  (a) status post adjuvant radiation  (b) status post tamoxifen for 5 years  (2) status post left breast biopsy 07/04/2014 for a clinical T1a N0, stage IA invasive ductal carcinoma, triple positive, with an MIB-1 of 80%  (3) status post left mastectomy and sentinel lymph node sampling 08/06/2014 for a pT1a pN0, stage IA invasive ductal carcinoma, grade 2, with negative margins. 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  (7) tobacco abuse: The patient has been strongly advised to quit  PLAN: Baily looks and feels great today. The labs were reviewed in detail and were unchanged. She will proceed with cycle 7 of paclitaxel as planned.  We reviewed the results of her genetic testing that was virtually negative except for a variant of unknown significance in the PALB2 gene. According to Roma Kayser, this is not to be treated as a clinically significant result.   Tyshawn will return in 1 week for cycle 8 of paclitaxel along with trastuzumab. She understands and agrees with this plan. She knows the goal of treatment in her case is cure. She has been encouraged to call with any issues that might arise before her next visit here.   Laurie [name], NP   10/28/2014 10:13 AM

## 2014-10-29 ENCOUNTER — Other Ambulatory Visit: Payer: Self-pay | Admitting: Nurse Practitioner

## 2014-10-29 ENCOUNTER — Telehealth: Payer: Self-pay | Admitting: Oncology

## 2014-10-29 NOTE — Telephone Encounter (Signed)
11/14 appointments changed per pof for stage 4 patient and patient will get a new avs at 10/31

## 2014-11-01 ENCOUNTER — Encounter: Payer: Self-pay | Admitting: Internal Medicine

## 2014-11-01 NOTE — Telephone Encounter (Signed)
I called and spoke to your insurance company. They were able to go over the EOB (Explanation of Benefits). She stated that the charges for the 04/03/2014 office visit was $201.66 and the insurance paid $201.66; also during the visit there was a charge for the EKG due the chest wall pain. You were charged $42.13. Insurance is not paying that amount due to your deductible. That is the amount that should have been charged, at least for that date. If your bill is for more than $42.13 then billing would need be contacted to find out why the charges are more (per DOS 04/03/2014).

## 2014-11-04 ENCOUNTER — Ambulatory Visit (HOSPITAL_BASED_OUTPATIENT_CLINIC_OR_DEPARTMENT_OTHER): Payer: 59 | Admitting: Nurse Practitioner

## 2014-11-04 ENCOUNTER — Other Ambulatory Visit: Payer: Self-pay | Admitting: Oncology

## 2014-11-04 ENCOUNTER — Encounter: Payer: Self-pay | Admitting: Nurse Practitioner

## 2014-11-04 ENCOUNTER — Other Ambulatory Visit (HOSPITAL_BASED_OUTPATIENT_CLINIC_OR_DEPARTMENT_OTHER): Payer: 59

## 2014-11-04 ENCOUNTER — Ambulatory Visit (HOSPITAL_BASED_OUTPATIENT_CLINIC_OR_DEPARTMENT_OTHER): Payer: 59

## 2014-11-04 VITALS — BP 123/63 | HR 81 | Temp 98.2°F | Resp 18 | Wt 119.4 lb

## 2014-11-04 DIAGNOSIS — C50912 Malignant neoplasm of unspecified site of left female breast: Secondary | ICD-10-CM

## 2014-11-04 DIAGNOSIS — Z5112 Encounter for antineoplastic immunotherapy: Secondary | ICD-10-CM | POA: Diagnosis not present

## 2014-11-04 DIAGNOSIS — Z170 Estrogen receptor positive status [ER+]: Secondary | ICD-10-CM | POA: Diagnosis not present

## 2014-11-04 DIAGNOSIS — Z720 Tobacco use: Secondary | ICD-10-CM | POA: Diagnosis not present

## 2014-11-04 DIAGNOSIS — Z5111 Encounter for antineoplastic chemotherapy: Secondary | ICD-10-CM

## 2014-11-04 LAB — COMPREHENSIVE METABOLIC PANEL (CC13)
ALT: 20 U/L (ref 0–55)
ANION GAP: 8 meq/L (ref 3–11)
AST: 20 U/L (ref 5–34)
Albumin: 4.2 g/dL (ref 3.5–5.0)
Alkaline Phosphatase: 50 U/L (ref 40–150)
BUN: 10.7 mg/dL (ref 7.0–26.0)
CHLORIDE: 105 meq/L (ref 98–109)
CO2: 28 meq/L (ref 22–29)
CREATININE: 0.8 mg/dL (ref 0.6–1.1)
Calcium: 10 mg/dL (ref 8.4–10.4)
EGFR: 85 mL/min/{1.73_m2} — ABNORMAL LOW (ref >90)
GLUCOSE: 105 mg/dL (ref 70–140)
Potassium: 4.3 mEq/L (ref 3.5–5.1)
SODIUM: 140 meq/L (ref 136–145)
Total Bilirubin: 0.3 mg/dL (ref 0.20–1.20)
Total Protein: 6.8 g/dL (ref 6.4–8.3)

## 2014-11-04 LAB — CBC WITH DIFFERENTIAL/PLATELET
BASO%: 0.8 % (ref 0.0–2.0)
BASOS ABS: 0 10*3/uL (ref 0.0–0.1)
EOS ABS: 0 10*3/uL (ref 0.0–0.5)
EOS%: 0.4 % (ref 0.0–7.0)
HEMATOCRIT: 40.1 % (ref 34.8–46.6)
HEMOGLOBIN: 13.5 g/dL (ref 11.6–15.9)
LYMPH#: 1.3 10*3/uL (ref 0.9–3.3)
LYMPH%: 26.5 % (ref 14.0–49.7)
MCH: 34.4 pg — AB (ref 25.1–34.0)
MCHC: 33.8 g/dL (ref 31.5–36.0)
MCV: 101.8 fL — AB (ref 79.5–101.0)
MONO#: 0.3 10*3/uL (ref 0.1–0.9)
MONO%: 6.5 % (ref 0.0–14.0)
NEUT%: 65.8 % (ref 38.4–76.8)
NEUTROS ABS: 3.3 10*3/uL (ref 1.5–6.5)
Platelets: 192 10*3/uL (ref 145–400)
RBC: 3.94 10*6/uL (ref 3.70–5.45)
RDW: 14.3 % (ref 11.2–14.5)
WBC: 5 10*3/uL (ref 3.9–10.3)

## 2014-11-04 MED ORDER — PACLITAXEL CHEMO INJECTION 300 MG/50ML
80.0000 mg/m2 | Freq: Once | INTRAVENOUS | Status: AC
  Administered 2014-11-04: 126 mg via INTRAVENOUS
  Filled 2014-11-04: qty 21

## 2014-11-04 MED ORDER — SODIUM CHLORIDE 0.9 % IV SOLN
Freq: Once | INTRAVENOUS | Status: AC
  Administered 2014-11-04: 12:00:00 via INTRAVENOUS

## 2014-11-04 MED ORDER — ACETAMINOPHEN 325 MG PO TABS
ORAL_TABLET | ORAL | Status: AC
  Filled 2014-11-04: qty 2

## 2014-11-04 MED ORDER — SODIUM CHLORIDE 0.9 % IV SOLN
6.0000 mg/kg | Freq: Once | INTRAVENOUS | Status: AC
  Administered 2014-11-04: 315 mg via INTRAVENOUS
  Filled 2014-11-04: qty 15

## 2014-11-04 MED ORDER — FAMOTIDINE IN NACL 20-0.9 MG/50ML-% IV SOLN
20.0000 mg | Freq: Once | INTRAVENOUS | Status: AC
  Administered 2014-11-04: 20 mg via INTRAVENOUS

## 2014-11-04 MED ORDER — DEXAMETHASONE SODIUM PHOSPHATE 100 MG/10ML IJ SOLN
Freq: Once | INTRAMUSCULAR | Status: AC
  Administered 2014-11-04: 13:00:00 via INTRAVENOUS
  Filled 2014-11-04: qty 2

## 2014-11-04 MED ORDER — ACETAMINOPHEN 325 MG PO TABS
650.0000 mg | ORAL_TABLET | Freq: Once | ORAL | Status: AC
  Administered 2014-11-04: 650 mg via ORAL

## 2014-11-04 MED ORDER — FAMOTIDINE IN NACL 20-0.9 MG/50ML-% IV SOLN
INTRAVENOUS | Status: AC
  Filled 2014-11-04: qty 50

## 2014-11-04 NOTE — Patient Instructions (Signed)
[facility] Discharge Instructions for Patients Receiving Chemotherapy  Today you received the following chemotherapy agents: Taxol and Herceptin.   To help prevent nausea and vomiting after your treatment, we encourage you to take your nausea medication as directed.    If you develop nausea and vomiting that is not controlled by your nausea medication, call the clinic.   BELOW ARE SYMPTOMS THAT SHOULD BE REPORTED IMMEDIATELY:  *FEVER GREATER THAN 100.5 F  *CHILLS WITH OR WITHOUT FEVER  NAUSEA AND VOMITING THAT IS NOT CONTROLLED WITH YOUR NAUSEA MEDICATION  *UNUSUAL SHORTNESS OF BREATH  *UNUSUAL BRUISING OR BLEEDING  TENDERNESS IN MOUTH AND THROAT WITH OR WITHOUT PRESENCE OF ULCERS  *URINARY PROBLEMS  *BOWEL PROBLEMS  UNUSUAL RASH Items with * indicate a potential emergency and should be followed up as soon as possible.  Feel free to call the clinic you have any questions or concerns. The clinic phone number is [phone].  Please show the CHEMO ALERT CARD at check-in to the Emergency Department and triage nurse.   

## 2014-11-04 NOTE — Progress Notes (Signed)
[name]  Telephone:([phone]) [phone] Fax:(336) [phone]     ID: Tanya [name] DOB: 1950/09/13  MR#: [id number]  GEX#:[id number]  Patient Care Team: Tanya Clack, MD as PCP - General Tanya Dragon, MD (Gastroenterology) Tanya Mass, MD (Obstetrics and Gynecology) Tanya Cruel, MD as Consulting Physician (Oncology) Tanya Ok, MD as Referring Physician (Surgery) PCP: Tanya Grant, MD OTHER MD: Tanya Breeding MD, Tanya Luria MD  CHIEF COMPLAINT: Estrogen receptor positive breast cancer  CURRENT TREATMENT: Paclitaxel plus trastuzumab, to be followed by anastrozole   BREAST CANCER HISTORY: From the original intake note:  At the age of 64, the patient underwent lumpectomy and sentinel lymph node biopsy for a left sided T1(mic) N0, stage IA invasive breast cancer. She completed whole breast irradiation and received tamoxifen for 5 years. She has continued on raloxifene.  More recently, screening mammography at the breast Center with tomography 06/19/2014 showed calcifications in the left breast, which were further evaluated with diagnostic left mammography 06/26/2014. Breast density was category C. There was a 5 mm group of heterogeneous calcifications in the outer left breast. This was biopsied 07/04/2014, and showed ([id number]) invasive and in situ ductal carcinoma, grade 2. There was a minimal amount of invasive tumor, but it did allow for prognostic panel, which showed the cancer to be estrogen receptor 90% positive, progesterone receptor 80% positive, both with strong staining intensity, with an MIB-1 of 80%, and HER-2 amplified, with a signals ratio of 8.09, the number per cell being 8.90.  The patient then was evaluated at [facility] by Dr. Elisha [name] and, given the prior history of radiation, mastectomy with repeat sentinel lymph node sampling was suggested. This was performed 08/08/2014, and showed ([id number]) invasive ductal  carcinoma, grade 1, measuring 0.3 cm. Margins were negative. All 3 sentinel lymph nodes sampled were negative. Repeat prognostic panel was obtained on the DCIS only, and found the tumor to be estrogen receptor positive at 65% and progesterone receptor positive at 75%.  The patient's subsequent history is as detailed below.   INTERVAL HISTORY: Tanya [name] returns today for follow-up of her breast cancer. Today is day 1 cycle 8 of her weekly paclitaxel, with trastuzumab given every 3 weeks which is also due today. The interval history is remarkable for her husband breaking one of his legs suddenly at home last week. He is currently admitted to [facility], and is anticipating discharge to a nursing home today. She is anxious and a little fatigued from driving back and forth to the hospital. She is preparing all of his meals at home because he does not enjoy hospital food. Otherwise she is [name].   REVIEW OF SYSTEMS: Tanya [name] denies fevers, chills, nausea, vomiting, or changes in bowel or bladder habits. She has no mouth sores, rashes, or neuropathy symptoms. She is eating and drinking well. She denies shortness of breath, chest pain, excessive cough, or palpitations. She has no headaches, dizziness, or vision changes. A detailed review of systems is otherwise stable.  PAST MEDICAL HISTORY: Past Medical History  Diagnosis Date  . COPD, mild ([place])   . Smoker   . Osteopenia     DEXA 10/27/11: -1.4, no change from 10/2009  . Shingles   . Breast cancer ([name])     Radiation and lumpectomy  . Family history of breast cancer   . Family history of uterine cancer     PAST SURGICAL HISTORY: Past Surgical History  Procedure Laterality Date  . Tonsillectomy and adenoidectomy    .  Dilation and evacuation    . Breast lumpectomy    . Breast biopsy      FAMILY HISTORY Family History  Problem Relation Age of Onset  . CAD Mother     stent older age  . Hypertension Mother   . Breast cancer Mother 72  .  Cancer Mother     SKIN and BLADDER   . Heart disease Paternal Grandfather   . Lung cancer Paternal Grandfather   . Hypertension Father   . Heart disease Father 34    CHF   . Dementia Father   . Hearing loss Maternal Grandfather   . Parkinson's disease Paternal Grandmother    the patient's father died with Alzheimer's disease at age 85. The patient's mother was diagnosed with breast cancer at age 20. She died from C. difficile colitis at age 9. The patient has one brother, one sister. There is no history of breast or ovarian cancer in the immediate family.  GYNECOLOGIC HISTORY:  No LMP recorded. Patient is postmenopausal. Menarche age 68, the patient is GX P0. She underwent menopause approximately the year 2000. She did not take hormone replacement. 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 Her husband, Tanya [name], used to be in Press photographer (Set designer) but now has had 2 back fusions and is disabled. At home is just the 2 of them plus her dog Max   ADVANCED DIRECTIVES: In place   HEALTH MAINTENANCE: Social History  Substance Use Topics  . Smoking status: Current Every Day Smoker -- 1.00 packs/day for 30 years    Types: Cigarettes  . Smokeless tobacco: Never Used     Comment: Trying to smoke less.   . Alcohol Use: 8.4 oz/week    14 Standard drinks or equivalent per week     Comment: 5 DRINKS A WEEK     Colonoscopy: November 2009  PAP:  Bone density: November 2015:  Lipid panel:  Allergies  Allergen Reactions  . Codeine     REACTION: Rash  . Tetanus Toxoid     REACTION: Shock    Current Outpatient Prescriptions  Medication Sig Dispense Refill  . Calcium Carbonate-Vitamin D 600-400 MG-UNIT tablet Take 1 tablet by mouth 2 (two) times daily.    . Cholecalciferol (VITAMIN D-1000 MAX ST) 1000 UNITS tablet Take 1,000 Units by mouth.    . Misc  Natural Products (OSTEO BI-FLEX TRIPLE STRENGTH) TABS Take 1 tablet by mouth 2 (two) times daily.    Marland Kitchen ibuprofen (MOTRIN IB) 200 MG tablet Take 1 tablet (200 mg total) by mouth every 6 (six) hours as needed. 30 tablet 0  . LORazepam (ATIVAN) 0.5 MG tablet Take 1 tablet (0.5 mg total) by mouth every 8 (eight) hours. (Patient not taking: Reported on 11/04/2014) 30 tablet 0  . ondansetron (ZOFRAN) 8 MG tablet Take 1 tablet (8 mg total) by mouth 2 (two) times daily as needed for nausea or vomiting. (Patient not taking: Reported on 10/21/2014) 20 tablet 1  . prochlorperazine (COMPAZINE) 10 MG tablet Take 1 tablet (10 mg total) by mouth every 6 (six) hours as needed for nausea or vomiting. (Patient not taking: Reported on 10/28/2014) 30 tablet 1   No current facility-administered medications for this visit.    OBJECTIVE: Middle-aged white woman Filed Vitals:   11/04/14 1039  BP: 123/63  Pulse: 81  Temp: 98.2 F (36.8 C)  Resp: 18     Body [name] index is 22.75 kg/(m^2).      ECOG FS:1 - Symptomatic but completely ambulatory  Skin: warm, dry  HEENT: sclerae anicteric, conjunctivae pink, oropharynx clear. No thrush or mucositis.  Lymph Nodes: No cervical or supraclavicular lymphadenopathy  Lungs: clear to auscultation bilaterally, no rales, wheezes, or rhonci  Heart: regular rate and rhythm  Abdomen: round, soft, non tender, positive bowel sounds  Musculoskeletal: No focal spinal tenderness, no peripheral edema  Neuro: non focal, well oriented, positive affect  Breasts: deferred  LAB RESULTS:  CMP     Component Value Date/Time   NA 140 11/04/2014 1008   NA 138 04/03/2014 1059   K 4.3 11/04/2014 1008   K 4.2 04/03/2014 1059   CL 101 04/03/2014 1059   CO2 28 11/04/2014 1008   CO2 33* 04/03/2014 1059   GLUCOSE 105 11/04/2014 1008   GLUCOSE 97 04/03/2014 1059   BUN 10.7 11/04/2014 1008   BUN 15 04/03/2014 1059   CREATININE 0.8 11/04/2014 1008   CREATININE 0.73 04/03/2014 1059    CREATININE 0.66 06/08/2013 1016   CALCIUM 10.0 11/04/2014 1008   CALCIUM 9.9 04/03/2014 1059   PROT 6.8 11/04/2014 1008   PROT 7.4 04/03/2014 1059   ALBUMIN 4.2 11/04/2014 1008   ALBUMIN 4.5 04/03/2014 1059   AST 20 11/04/2014 1008   AST 20 04/03/2014 1059   ALT 20 11/04/2014 1008   ALT 13 04/03/2014 1059   ALKPHOS 50 11/04/2014 1008   ALKPHOS 40 04/03/2014 1059   BILITOT 0.30 11/04/2014 1008   BILITOT 0.5 04/03/2014 1059    INo results found for: SPEP, UPEP  Lab Results  Component Value Date   WBC 5.0 11/04/2014   NEUTROABS 3.3 11/04/2014   HGB 13.5 11/04/2014   HCT 40.1 11/04/2014   MCV 101.8* 11/04/2014   PLT 192 11/04/2014      Chemistry      Component Value Date/Time   NA 140 11/04/2014 1008   NA 138 04/03/2014 1059   K 4.3 11/04/2014 1008   K 4.2 04/03/2014 1059   CL 101 04/03/2014 1059   CO2 28 11/04/2014 1008   CO2 33* 04/03/2014 1059   BUN 10.7 11/04/2014 1008   BUN 15 04/03/2014 1059   CREATININE 0.8 11/04/2014 1008   CREATININE 0.73 04/03/2014 1059   CREATININE 0.66 06/08/2013 1016      Component Value Date/Time   CALCIUM 10.0 11/04/2014 1008   CALCIUM 9.9 04/03/2014 1059   ALKPHOS 50 11/04/2014 1008   ALKPHOS 40 04/03/2014 1059   AST 20 11/04/2014 1008   AST 20 04/03/2014 1059   ALT 20 11/04/2014 1008   ALT 13 04/03/2014 1059   BILITOT 0.30 11/04/2014 1008   BILITOT 0.5 04/03/2014 1059       No results found for: LABCA2  No components found for: LABCA125  No results for input(s): INR in the last 168 hours.  Urinalysis    Component Value Date/Time   COLORURINE YELLOW 04/03/2014 1059   APPEARANCEUR CLEAR 04/03/2014 1059   LABSPEC <=1.005* 04/03/2014 1059   PHURINE 7.0 04/03/2014 1059   GLUCOSEU NEGATIVE 04/03/2014 1059   GLUCOSEU NEG 06/08/2013 1016   HGBUR TRACE-INTACT* 04/03/2014 1059   BILIRUBINUR NEGATIVE 04/03/2014 1059   KETONESUR NEGATIVE 04/03/2014 1059   PROTEINUR NEG 06/08/2013 1016   UROBILINOGEN 0.2 04/03/2014 1059     NITRITE NEGATIVE 04/03/2014 1059   LEUKOCYTESUR NEGATIVE 04/03/2014 1059    STUDIES: No results found.  ASSESSMENT:  64 y.o. [place] woman  (1) status post left lumpectomy and sentinel lymph node sampling 1999 for a T1(mic) N0, stage IA invasive breast cancer,  (a) status post adjuvant radiation  (b) status post tamoxifen for 5 years  (2) status post left breast biopsy 07/04/2014 for a clinical T1a N0, stage IA invasive ductal carcinoma, triple positive, with an MIB-1 of 80%  (3) status post left mastectomy and sentinel lymph node sampling 08/06/2014 for a pT1a pN0, stage IA invasive ductal carcinoma, grade 2, with negative margins. 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  (7) tobacco abuse: The patient has been strongly advised to quit  PLAN: Sandee continues to tolerate treatment well with no signs of toxicity. The labs were reviewed in detail and were unchanged. She will proceed with cycle 8 of paclitaxel and the next dose of trastuzumab as planned.  Dannisha will return in 1 week for cycle 9 of paclitaxel. She understands and agrees with this plan. She knows the goal of treatment in her case is cure. She has been encouraged to call with any issues that might arise before her next visit here.   Laurie [name], NP   11/04/2014 10:54 AM

## 2014-11-11 ENCOUNTER — Telehealth: Payer: Self-pay | Admitting: General Practice

## 2014-11-11 ENCOUNTER — Other Ambulatory Visit (HOSPITAL_BASED_OUTPATIENT_CLINIC_OR_DEPARTMENT_OTHER): Payer: 59

## 2014-11-11 ENCOUNTER — Ambulatory Visit (HOSPITAL_BASED_OUTPATIENT_CLINIC_OR_DEPARTMENT_OTHER): Payer: 59 | Admitting: Oncology

## 2014-11-11 ENCOUNTER — Telehealth: Payer: Self-pay | Admitting: Oncology

## 2014-11-11 ENCOUNTER — Ambulatory Visit (HOSPITAL_BASED_OUTPATIENT_CLINIC_OR_DEPARTMENT_OTHER): Payer: 59

## 2014-11-11 VITALS — BP 128/64 | HR 81 | Temp 98.1°F | Resp 18 | Ht 60.75 in | Wt 117.9 lb

## 2014-11-11 DIAGNOSIS — Z5111 Encounter for antineoplastic chemotherapy: Secondary | ICD-10-CM

## 2014-11-11 DIAGNOSIS — Z720 Tobacco use: Secondary | ICD-10-CM

## 2014-11-11 DIAGNOSIS — C50412 Malignant neoplasm of upper-outer quadrant of left female breast: Secondary | ICD-10-CM | POA: Diagnosis not present

## 2014-11-11 DIAGNOSIS — R439 Unspecified disturbances of smell and taste: Secondary | ICD-10-CM

## 2014-11-11 DIAGNOSIS — Z170 Estrogen receptor positive status [ER+]: Secondary | ICD-10-CM | POA: Diagnosis not present

## 2014-11-11 DIAGNOSIS — C50912 Malignant neoplasm of unspecified site of left female breast: Secondary | ICD-10-CM

## 2014-11-11 DIAGNOSIS — F329 Major depressive disorder, single episode, unspecified: Secondary | ICD-10-CM

## 2014-11-11 LAB — COMPREHENSIVE METABOLIC PANEL (CC13)
ALT: 23 U/L (ref 0–55)
AST: 21 U/L (ref 5–34)
Albumin: 4.2 g/dL (ref 3.5–5.0)
Alkaline Phosphatase: 49 U/L (ref 40–150)
Anion Gap: 9 mEq/L (ref 3–11)
BUN: 16.4 mg/dL (ref 7.0–26.0)
CALCIUM: 9.7 mg/dL (ref 8.4–10.4)
CHLORIDE: 103 meq/L (ref 98–109)
CO2: 27 meq/L (ref 22–29)
CREATININE: 0.8 mg/dL (ref 0.6–1.1)
EGFR: 81 mL/min/{1.73_m2} — ABNORMAL LOW (ref >90)
Glucose: 147 mg/dl — ABNORMAL HIGH (ref 70–140)
POTASSIUM: 4.1 meq/L (ref 3.5–5.1)
Sodium: 139 mEq/L (ref 136–145)
Total Bilirubin: 0.45 mg/dL (ref 0.20–1.20)
Total Protein: 6.8 g/dL (ref 6.4–8.3)

## 2014-11-11 LAB — CBC WITH DIFFERENTIAL/PLATELET
BASO%: 0.4 % (ref 0.0–2.0)
BASOS ABS: 0 10*3/uL (ref 0.0–0.1)
EOS%: 1.3 % (ref 0.0–7.0)
Eosinophils Absolute: 0.1 10*3/uL (ref 0.0–0.5)
HEMATOCRIT: 42 % (ref 34.8–46.6)
HGB: 13.9 g/dL (ref 11.6–15.9)
LYMPH%: 26.6 % (ref 14.0–49.7)
MCH: 34.2 pg — AB (ref 25.1–34.0)
MCHC: 33.2 g/dL (ref 31.5–36.0)
MCV: 103.2 fL — ABNORMAL HIGH (ref 79.5–101.0)
MONO#: 0.3 10*3/uL (ref 0.1–0.9)
MONO%: 5.5 % (ref 0.0–14.0)
NEUT#: 3.5 10*3/uL (ref 1.5–6.5)
NEUT%: 66.2 % (ref 38.4–76.8)
Platelets: 195 10*3/uL (ref 145–400)
RBC: 4.07 10*6/uL (ref 3.70–5.45)
RDW: 14.6 % — ABNORMAL HIGH (ref 11.2–14.5)
WBC: 5.3 10*3/uL (ref 3.9–10.3)
lymph#: 1.4 10*3/uL (ref 0.9–3.3)

## 2014-11-11 MED ORDER — FAMOTIDINE IN NACL 20-0.9 MG/50ML-% IV SOLN
INTRAVENOUS | Status: AC
  Filled 2014-11-11: qty 50

## 2014-11-11 MED ORDER — LORAZEPAM 0.5 MG PO TABS
0.5000 mg | ORAL_TABLET | Freq: Three times a day (TID) | ORAL | Status: DC
Start: 2014-11-11 — End: 2015-04-15

## 2014-11-11 MED ORDER — PACLITAXEL CHEMO INJECTION 300 MG/50ML
80.0000 mg/m2 | Freq: Once | INTRAVENOUS | Status: AC
  Administered 2014-11-11: 126 mg via INTRAVENOUS
  Filled 2014-11-11: qty 21

## 2014-11-11 MED ORDER — BUPROPION HCL ER (XL) 150 MG PO TB24: 150.0000 mg | ORAL_TABLET | Freq: Every day | ORAL | Status: DC

## 2014-11-11 MED ORDER — FAMOTIDINE IN NACL 20-0.9 MG/50ML-% IV SOLN
20.0000 mg | Freq: Once | INTRAVENOUS | Status: AC
  Administered 2014-11-11: 20 mg via INTRAVENOUS

## 2014-11-11 MED ORDER — SODIUM CHLORIDE 0.9 % IV SOLN
Freq: Once | INTRAVENOUS | Status: AC
  Administered 2014-11-11: 09:00:00 via INTRAVENOUS

## 2014-11-11 MED ORDER — SODIUM CHLORIDE 0.9 % IV SOLN
Freq: Once | INTRAVENOUS | Status: AC
  Administered 2014-11-11: 10:00:00 via INTRAVENOUS
  Filled 2014-11-11: qty 2

## 2014-11-11 NOTE — Patient Instructions (Signed)
[facility] Discharge Instructions for Patients Receiving Chemotherapy  Today you received the following chemotherapy agents Taxol  To help prevent nausea and vomiting after your treatment, we encourage you to take your nausea medication   If you develop nausea and vomiting that is not controlled by your nausea medication, call the clinic.   BELOW ARE SYMPTOMS THAT SHOULD BE REPORTED IMMEDIATELY:  *FEVER GREATER THAN 100.5 F  *CHILLS WITH OR WITHOUT FEVER  NAUSEA AND VOMITING THAT IS NOT CONTROLLED WITH YOUR NAUSEA MEDICATION  *UNUSUAL SHORTNESS OF BREATH  *UNUSUAL BRUISING OR BLEEDING  TENDERNESS IN MOUTH AND THROAT WITH OR WITHOUT PRESENCE OF ULCERS  *URINARY PROBLEMS  *BOWEL PROBLEMS  UNUSUAL RASH Items with * indicate a potential emergency and should be followed up as soon as possible.  Feel free to call the clinic you have any questions or concerns. The clinic phone number is [phone].  Please show the CHEMO ALERT CARD at check-in to the Emergency Department and triage nurse.   

## 2014-11-11 NOTE — Progress Notes (Signed)
[facility]  [phone] [phone]     ID: Tanya [name] DOB: 12/17/1950  MR#: [id number]  CSN#:[id number]  Patient Care Team: Valerie A Leschber, MD as PCP - General Dora M Brodie, MD (Gastroenterology) Juan H Fernandez, MD (Obstetrics and Gynecology) Gustav C [name], MD as Consulting Physician (Oncology) Marissa Howard-McNatt, MD as Referring Physician (Surgery) PCP: Valerie Leschber, MD OTHER MD: James Hochrein MD, Hope Gruber MD  CHIEF COMPLAINT: Estrogen receptor positive breast cancer  CURRENT TREATMENT: Paclitaxel plus trastuzumab, to be followed by anastrozole   BREAST CANCER HISTORY: From the original intake note:  At the age of 47, the patient underwent lumpectomy and sentinel lymph node biopsy for a left sided T1(mic) N0, stage IA invasive breast cancer. She completed whole breast irradiation and received tamoxifen for 5 years. She has continued on raloxifene.  More recently, screening mammography at the breast Center with tomography 06/19/2014 showed calcifications in the left breast, which were further evaluated with diagnostic left mammography 06/26/2014. Breast density was category C. There was a 5 mm group of heterogeneous calcifications in the outer left breast. This was biopsied 07/04/2014, and showed ([id number]) invasive and in situ ductal carcinoma, grade 2. There was a minimal amount of invasive tumor, but it did allow for prognostic panel, which showed the cancer to be estrogen receptor 90% positive, progesterone receptor 80% positive, both with strong staining intensity, with an MIB-1 of 80%, and HER-2 amplified, with a signals ratio of 8.09, the number per cell being 8.90.  The patient then was evaluated at [facility] by Dr. Howard-McNatt and, given the prior history of radiation, mastectomy with repeat sentinel lymph node sampling was suggested. This was performed 08/08/2014, and showed ([id number]) invasive ductal  carcinoma, grade 1, measuring 0.3 cm. Margins were negative. All 3 sentinel lymph nodes sampled were negative. Repeat prognostic panel was obtained on the DCIS only, and found the tumor to be estrogen receptor positive at 65% and progesterone receptor positive at 75%.  The patient's subsequent history is as detailed below.   INTERVAL HISTORY: Tanya [name] returns today for follow-up of her breast cancer. Today is day 1 cycle 9 of her weekly paclitaxel, with trastuzumab given every 3 weeks (given 8 days ago). She is tolerating treatment remarkably well. Of course she has lost her hair. She has no problems with nausea. She had very minimal mouth sores at one time but these have resolved. She is not exercising, but she is also not unusually fatigued. She does not have a port. She had minimal extravasation of Herceptin one time, without sequela. Otherwise her veins are holding up well  However she is very stressed because her husband fell and broke his hip. She does not know why he fell and she thinks he has passed out a couple of times. In addition she tells me he is emotionally very variable right now which is also a new problem for him. She is going to be discussing this with the physician taking care of his rehabilitation at [facility]  REVIEW OF SYSTEMS: Tanya [name] is experiencing some taste perversion, and has very little tolerance for acidic or salty foods at present. Unfortunately she continues to smoke but tells me she is strongly considering discontinuing smoking now that her husband has been in the hospital for while and not been able to smoke there. She has had absolutely no peripheral neuropathy symptoms. A detailed review of systems today was otherwise stable.  PAST MEDICAL HISTORY: Past Medical History    Diagnosis Date  . COPD, mild (HCC)   . Smoker   . Osteopenia     DEXA 10/27/11: -1.4, no change from 10/2009  . Shingles   . Breast cancer (HCC)     Radiation and lumpectomy  . Family  history of breast cancer   . Family history of uterine cancer     PAST SURGICAL HISTORY: Past Surgical History  Procedure Laterality Date  . Tonsillectomy and adenoidectomy    . Dilation and evacuation    . Breast lumpectomy    . Breast biopsy      FAMILY HISTORY Family History  Problem Relation Age of Onset  . CAD Mother     stent older age  . Hypertension Mother   . Breast cancer Mother 80  . Cancer Mother     SKIN and BLADDER   . Heart disease Paternal Grandfather   . Lung cancer Paternal Grandfather   . Hypertension Father   . Heart disease Father 84    CHF   . Dementia Father   . Hearing loss Maternal Grandfather   . Parkinson's disease Paternal Grandmother    the patient's father died with Alzheimer's disease at age 84. The patient's mother was diagnosed with breast cancer at age 80. She died from C. difficile colitis at age 83. The patient has one brother, one sister. There is no history of breast or ovarian cancer in the immediate family.  GYNECOLOGIC HISTORY:  No LMP recorded. Patient is postmenopausal. Menarche age 13, the patient is GX P0. She underwent menopause approximately the year 2000. She did not take hormone replacement. 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 Her husband, Jimmy, used to be in sales (building material) but now has had 2 back fusions and is disabled. At home is just the 2 of them plus her dog Max   ADVANCED DIRECTIVES: In place   HEALTH MAINTENANCE: Social History  Substance Use Topics  . Smoking status: Current Every Day Smoker -- 1.00 packs/day for 30 years    Types: Cigarettes  . Smokeless tobacco: Never Used     Comment: Trying to smoke less.   . Alcohol Use: 8.4 oz/week    14 Standard drinks or equivalent per week     Comment: 5 DRINKS A WEEK     Colonoscopy: November 2009  PAP:  Bone  density: November 2015:  Lipid panel:  Allergies  Allergen Reactions  . Codeine     REACTION: Rash  . Tetanus Toxoid     REACTION: Shock    Current Outpatient Prescriptions  Medication Sig Dispense Refill  . LORazepam (ATIVAN) 0.5 MG tablet Take 1 tablet (0.5 mg total) by mouth every 8 (eight) hours. 30 tablet 0  . buPROPion (WELLBUTRIN XL) 150 MG 24 hr tablet Take 1 tablet (150 mg total) by mouth daily. 90 tablet 3  . Calcium Carbonate-Vitamin D 600-400 MG-UNIT tablet Take 1 tablet by mouth 2 (two) times daily.    . Cholecalciferol (VITAMIN D-1000 MAX ST) 1000 UNITS tablet Take 1,000 Units by mouth.    . ibuprofen (MOTRIN IB) 200 MG tablet Take 1 tablet (200 mg total) by mouth every 6 (six) hours as needed. 30 tablet 0  . Misc Natural Products (OSTEO BI-FLEX TRIPLE STRENGTH) TABS Take 1 tablet by mouth 2 (two) times daily.    . ondansetron (ZOFRAN) 8 MG tablet   Take 1 tablet (8 mg total) by mouth 2 (two) times daily as needed for nausea or vomiting. (Patient not taking: Reported on 10/21/2014) 20 tablet 1  . prochlorperazine (COMPAZINE) 10 MG tablet Take 1 tablet (10 mg total) by mouth every 6 (six) hours as needed for nausea or vomiting. (Patient not taking: Reported on 10/28/2014) 30 tablet 1   No current facility-administered medications for this visit.    OBJECTIVE: Middle-aged white woman in no acute distress Filed Vitals:   11/11/14 0809  BP: 128/64  Pulse: 81  Temp: 98.1 F (36.7 C)  Resp: 18     Body mass index is 22.46 kg/(m^2).      ECOG FS:1 - Symptomatic but completely ambulatory  Sclerae unicteric, EOMs intact Oropharynx clear, dentition in good repair No cervical or supraclavicular adenopathy Lungs no rales or rhonchi Heart regular rate and rhythm Abd soft, nontender, positive bowel sounds MSK no focal spinal tenderness, no upper extremity lymphedema Neuro: nonfocal, well oriented, appropriate affect Breasts: Deferred  LAB RESULTS:  CMP     Component  Value Date/Time   NA 139 11/11/2014 0739   NA 138 04/03/2014 1059   K 4.1 11/11/2014 0739   K 4.2 04/03/2014 1059   CL 101 04/03/2014 1059   CO2 27 11/11/2014 0739   CO2 33* 04/03/2014 1059   GLUCOSE 147* 11/11/2014 0739   GLUCOSE 97 04/03/2014 1059   BUN 16.4 11/11/2014 0739   BUN 15 04/03/2014 1059   CREATININE 0.8 11/11/2014 0739   CREATININE 0.73 04/03/2014 1059   CREATININE 0.66 06/08/2013 1016   CALCIUM 9.7 11/11/2014 0739   CALCIUM 9.9 04/03/2014 1059   PROT 6.8 11/11/2014 0739   PROT 7.4 04/03/2014 1059   ALBUMIN 4.2 11/11/2014 0739   ALBUMIN 4.5 04/03/2014 1059   AST 21 11/11/2014 0739   AST 20 04/03/2014 1059   ALT 23 11/11/2014 0739   ALT 13 04/03/2014 1059   ALKPHOS 49 11/11/2014 0739   ALKPHOS 40 04/03/2014 1059   BILITOT 0.45 11/11/2014 0739   BILITOT 0.5 04/03/2014 1059    INo results found for: SPEP, UPEP  Lab Results  Component Value Date   WBC 5.3 11/11/2014   NEUTROABS 3.5 11/11/2014   HGB 13.9 11/11/2014   HCT 42.0 11/11/2014   MCV 103.2* 11/11/2014   PLT 195 11/11/2014      Chemistry      Component Value Date/Time   NA 139 11/11/2014 0739   NA 138 04/03/2014 1059   K 4.1 11/11/2014 0739   K 4.2 04/03/2014 1059   CL 101 04/03/2014 1059   CO2 27 11/11/2014 0739   CO2 33* 04/03/2014 1059   BUN 16.4 11/11/2014 0739   BUN 15 04/03/2014 1059   CREATININE 0.8 11/11/2014 0739   CREATININE 0.73 04/03/2014 1059   CREATININE 0.66 06/08/2013 1016      Component Value Date/Time   CALCIUM 9.7 11/11/2014 0739   CALCIUM 9.9 04/03/2014 1059   ALKPHOS 49 11/11/2014 0739   ALKPHOS 40 04/03/2014 1059   AST 21 11/11/2014 0739   AST 20 04/03/2014 1059   ALT 23 11/11/2014 0739   ALT 13 04/03/2014 1059   BILITOT 0.45 11/11/2014 0739   BILITOT 0.5 04/03/2014 1059       No results found for: LABCA2  No components found for: LABCA125  No results for input(s): INR in the last 168 hours.  Urinalysis    Component Value Date/Time   COLORURINE  YELLOW 04/03/2014 1059   APPEARANCEUR  CLEAR 04/03/2014 1059   LABSPEC <=1.005* 04/03/2014 1059   PHURINE 7.0 04/03/2014 1059   GLUCOSEU NEGATIVE 04/03/2014 1059   GLUCOSEU NEG 06/08/2013 1016   HGBUR TRACE-INTACT* 04/03/2014 [place] 04/03/2014 1059   KETONESUR NEGATIVE 04/03/2014 1059   PROTEINUR NEG 06/08/2013 1016   UROBILINOGEN 0.2 04/03/2014 1059   NITRITE NEGATIVE 04/03/2014 1059   LEUKOCYTESUR NEGATIVE 04/03/2014 1059    STUDIES: No results found.  ASSESSMENT: 65 y.o. [place] woman  (1) status post left lumpectomy and sentinel lymph node sampling 1999 for a T1(mic) N0, stage IA invasive breast cancer,  (a) status post adjuvant radiation  (b) status post tamoxifen for 5 years  (2) status post left breast biopsy 07/04/2014 for a clinical T1a N0, stage IA invasive ductal carcinoma, triple positive, with an MIB-1 of 80%  (3) status post left mastectomy and sentinel lymph node sampling 08/06/2014 for a pT1a pN0, stage IA invasive ductal carcinoma, grade 2, with negative margins. 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  (7) tobacco abuse: The patient has been strongly advised to quit  (a) Wellbutrin started 11/11/2014  PLAN: Tanya [name] will proceed with her ninth of 12 planned weekly Taxol today. She is tolerating treatment remarkably well.  She is very stressed because of her husband's situation. We're going to add Wellbutrin which I think will help with that and also move her towards  smoking cessation, something which she is expressing significant interest in at this point.  I don't have a solution for the altered taste problem but that will eventually go away, usually 2-3 months after completion of chemotherapy.  She will need a repeat echocardiogram in December. She will see me late December right after the echo and at that point we will consider anastrozole.  She knows to call for any problems that may develop before her next visit here. Chauncey Cruel, MD   11/11/2014 8:55 AM

## 2014-11-11 NOTE — Telephone Encounter (Signed)
Appointments made and avs printed for patient,echo to linda for precert °

## 2014-11-12 ENCOUNTER — Telehealth: Payer: Self-pay | Admitting: Oncology

## 2014-11-12 NOTE — Telephone Encounter (Signed)
Echo appointment made for December and she will get this at her 11/14 appointment

## 2014-11-18 ENCOUNTER — Encounter: Payer: Self-pay | Admitting: Physician Assistant

## 2014-11-18 ENCOUNTER — Encounter: Payer: Self-pay | Admitting: *Deleted

## 2014-11-18 ENCOUNTER — Other Ambulatory Visit (HOSPITAL_BASED_OUTPATIENT_CLINIC_OR_DEPARTMENT_OTHER): Payer: 59

## 2014-11-18 ENCOUNTER — Ambulatory Visit: Payer: 59 | Admitting: Oncology

## 2014-11-18 ENCOUNTER — Ambulatory Visit (HOSPITAL_BASED_OUTPATIENT_CLINIC_OR_DEPARTMENT_OTHER): Payer: 59 | Admitting: Physician Assistant

## 2014-11-18 ENCOUNTER — Ambulatory Visit (HOSPITAL_BASED_OUTPATIENT_CLINIC_OR_DEPARTMENT_OTHER): Payer: 59

## 2014-11-18 VITALS — BP 121/64 | HR 77 | Temp 98.6°F | Resp 20 | Ht 60.0 in | Wt 119.9 lb

## 2014-11-18 DIAGNOSIS — Z720 Tobacco use: Secondary | ICD-10-CM | POA: Diagnosis not present

## 2014-11-18 DIAGNOSIS — Z8049 Family history of malignant neoplasm of other genital organs: Secondary | ICD-10-CM

## 2014-11-18 DIAGNOSIS — Z853 Personal history of malignant neoplasm of breast: Secondary | ICD-10-CM

## 2014-11-18 DIAGNOSIS — C50912 Malignant neoplasm of unspecified site of left female breast: Secondary | ICD-10-CM

## 2014-11-18 DIAGNOSIS — Z170 Estrogen receptor positive status [ER+]: Secondary | ICD-10-CM

## 2014-11-18 DIAGNOSIS — F17200 Nicotine dependence, unspecified, uncomplicated: Secondary | ICD-10-CM

## 2014-11-18 DIAGNOSIS — Z5111 Encounter for antineoplastic chemotherapy: Secondary | ICD-10-CM

## 2014-11-18 DIAGNOSIS — C50412 Malignant neoplasm of upper-outer quadrant of left female breast: Secondary | ICD-10-CM

## 2014-11-18 DIAGNOSIS — Z1379 Encounter for other screening for genetic and chromosomal anomalies: Secondary | ICD-10-CM

## 2014-11-18 LAB — CBC WITH DIFFERENTIAL/PLATELET
BASO%: 0.8 % (ref 0.0–2.0)
Basophils Absolute: 0 10*3/uL (ref 0.0–0.1)
EOS%: 1 % (ref 0.0–7.0)
Eosinophils Absolute: 0 10*3/uL (ref 0.0–0.5)
HCT: 40.4 % (ref 34.8–46.6)
HEMOGLOBIN: 13.4 g/dL (ref 11.6–15.9)
LYMPH%: 28.8 % (ref 14.0–49.7)
MCH: 34.3 pg — ABNORMAL HIGH (ref 25.1–34.0)
MCHC: 33.2 g/dL (ref 31.5–36.0)
MCV: 103.3 fL — ABNORMAL HIGH (ref 79.5–101.0)
MONO#: 0.3 10*3/uL (ref 0.1–0.9)
MONO%: 6.8 % (ref 0.0–14.0)
NEUT%: 62.6 % (ref 38.4–76.8)
NEUTROS ABS: 2.8 10*3/uL (ref 1.5–6.5)
Platelets: 201 10*3/uL (ref 145–400)
RBC: 3.91 10*6/uL (ref 3.70–5.45)
RDW: 14.3 % (ref 11.2–14.5)
WBC: 4.4 10*3/uL (ref 3.9–10.3)
lymph#: 1.3 10*3/uL (ref 0.9–3.3)

## 2014-11-18 LAB — COMPREHENSIVE METABOLIC PANEL (CC13)
ALBUMIN: 4.1 g/dL (ref 3.5–5.0)
ALK PHOS: 45 U/L (ref 40–150)
ALT: 25 U/L (ref 0–55)
AST: 22 U/L (ref 5–34)
Anion Gap: 9 mEq/L (ref 3–11)
BILIRUBIN TOTAL: 0.35 mg/dL (ref 0.20–1.20)
BUN: 15.8 mg/dL (ref 7.0–26.0)
CO2: 30 mEq/L — ABNORMAL HIGH (ref 22–29)
CREATININE: 0.8 mg/dL (ref 0.6–1.1)
Calcium: 10 mg/dL (ref 8.4–10.4)
Chloride: 103 mEq/L (ref 98–109)
EGFR: 84 mL/min/{1.73_m2} — ABNORMAL LOW (ref >90)
GLUCOSE: 91 mg/dL (ref 70–140)
Potassium: 4.8 mEq/L (ref 3.5–5.1)
SODIUM: 142 meq/L (ref 136–145)
TOTAL PROTEIN: 6.6 g/dL (ref 6.4–8.3)

## 2014-11-18 MED ORDER — PACLITAXEL CHEMO INJECTION 300 MG/50ML
80.0000 mg/m2 | Freq: Once | INTRAVENOUS | Status: AC
  Administered 2014-11-18: 126 mg via INTRAVENOUS
  Filled 2014-11-18: qty 21

## 2014-11-18 MED ORDER — FAMOTIDINE IN NACL 20-0.9 MG/50ML-% IV SOLN
20.0000 mg | Freq: Once | INTRAVENOUS | Status: AC
  Administered 2014-11-18: 20 mg via INTRAVENOUS

## 2014-11-18 MED ORDER — FAMOTIDINE IN NACL 20-0.9 MG/50ML-% IV SOLN
INTRAVENOUS | Status: AC
  Filled 2014-11-18: qty 50

## 2014-11-18 MED ORDER — SODIUM CHLORIDE 0.9 % IV SOLN
Freq: Once | INTRAVENOUS | Status: AC
  Administered 2014-11-18: 10:00:00 via INTRAVENOUS

## 2014-11-18 MED ORDER — SODIUM CHLORIDE 0.9 % IV SOLN
Freq: Once | INTRAVENOUS | Status: AC
  Administered 2014-11-18: 10:00:00 via INTRAVENOUS
  Filled 2014-11-18: qty 2

## 2014-11-18 NOTE — Progress Notes (Signed)
A small area of slightly red irritation noted above and lateral to IV site.  Site is not puffy and patient states it feels fine.  It may have been from where the IV tube connection rubbed area.  Patient states she did have infiltration a couple of weeks ago and this does not feel or look like this.  Nevertheless, advised patient to keep an eye on this and if it does not improve or worsens, she is to notify us.  IV d/c'd by Demetrius Revel RN.

## 2014-11-18 NOTE — Patient Instructions (Signed)
[facility] Discharge Instructions for Patients Receiving Chemotherapy  Today you received the following chemotherapy agents Taxol  To help prevent nausea and vomiting after your treatment, we encourage you to take your nausea medication   If you develop nausea and vomiting that is not controlled by your nausea medication, call the clinic.   BELOW ARE SYMPTOMS THAT SHOULD BE REPORTED IMMEDIATELY:  *FEVER GREATER THAN 100.5 F  *CHILLS WITH OR WITHOUT FEVER  NAUSEA AND VOMITING THAT IS NOT CONTROLLED WITH YOUR NAUSEA MEDICATION  *UNUSUAL SHORTNESS OF BREATH  *UNUSUAL BRUISING OR BLEEDING  TENDERNESS IN MOUTH AND THROAT WITH OR WITHOUT PRESENCE OF ULCERS  *URINARY PROBLEMS  *BOWEL PROBLEMS  UNUSUAL RASH Items with * indicate a potential emergency and should be followed up as soon as possible.  Feel free to call the clinic you have any questions or concerns. The clinic phone number is [phone].  Please show the CHEMO ALERT CARD at check-in to the Emergency Department and triage nurse.   

## 2014-11-18 NOTE — Progress Notes (Signed)
Tanya [name]  Telephone:(336) [phone] Fax:(336) [phone]     ID: Tanya [name] DOB: 10-04-1950  MR#: [id number]  FWY#:[id number]  Patient Care Team: Rowe Clack, MD as PCP - General [name], MD (Gastroenterology) [name], MD (Obstetrics and Gynecology) Chauncey Cruel, MD as Consulting Physician (Oncology) [name], MD as Referring Physician (Surgery) PCP: Gwendolyn Grant, MD OTHER MD: [name] MD, [name] MD  CHIEF COMPLAINT: Estrogen receptor positive breast cancer  CURRENT TREATMENT: Paclitaxel plus trastuzumab, to be followed by anastrozole   BREAST CANCER HISTORY: From the original intake note:  At the age of 64, the patient underwent lumpectomy and sentinel lymph node biopsy for a left sided T1(mic) N0, stage IA invasive breast cancer. She completed whole breast irradiation and received tamoxifen for 5 years. She has continued on raloxifene.  More recently, screening mammography at the breast Center with tomography 06/19/2014 showed calcifications in the left breast, which were further evaluated with diagnostic left mammography 06/26/2014. Breast density was category C. There was a 5 mm group of heterogeneous calcifications in the outer left breast. This was biopsied 07/04/2014, and showed ([id number]) invasive and in situ ductal carcinoma, grade 2. There was a minimal amount of invasive tumor, but it did allow for prognostic panel, which showed the cancer to be estrogen receptor 90% positive, progesterone receptor 80% positive, both with strong staining intensity, with an MIB-1 of 80%, and HER-2 amplified, with a signals ratio of 8.09, the number per cell being 8.90.  The patient then was evaluated at [facility] by Dr. Elisha Headland and, given the prior history of radiation, mastectomy with repeat sentinel lymph node sampling was suggested. This was performed 08/08/2014, and showed ([id number]) invasive ductal  carcinoma, grade 1, measuring 0.3 cm. Margins were negative. All 3 sentinel lymph nodes sampled were negative. Repeat prognostic panel was obtained on the DCIS only, and found the tumor to be estrogen receptor positive at 65% and progesterone receptor positive at 75%.  The patient's subsequent history is as detailed below.   INTERVAL HISTORY: [name] returns today for follow-up of her breast cancer. Today is day 1 cycle 10 of her weekly paclitaxel, with trastuzumab given every 3 weeks (given 8 days ago). She is tolerating treatment remarkably well. She has now alopecia. Denies nausea. She denies any mucositis, although some acid foods are bothersome, which are avoided by her. She denies any open lesions or thrush. She is not exercising, but she is also not unusually fatigued. She does not have a port. She had minimal extravasation of Herceptin one time, without sequela. Otherwise her veins are holding up well  She is very stressed because her husband fell and broke his hip and is in a nursing home. She has not started on Wellbutrin yet. She continues to smoke.   REVIEW OF SYSTEMS: Nithya is experiencing some taste perversion, and has very little tolerance for acidic or salty foods at present. Unfortunately she continues to smoke but tells me she is strongly considering discontinuing smoking now that her husband has been in the hospital for while and not been able to smoke there. She has had absolutely no peripheral neuropathy symptoms. A detailed review of systems today was otherwise stable.  PAST MEDICAL HISTORY: Past Medical History  Diagnosis Date  . COPD, mild ([place])   . Smoker   . Osteopenia     DEXA 10/27/11: -1.4, no change from 10/2009  . Shingles   . Breast cancer ([place])  Radiation and lumpectomy  . Family history of breast cancer   . Family history of uterine cancer     PAST SURGICAL HISTORY: Past Surgical History  Procedure Laterality Date  . Tonsillectomy and adenoidectomy     . Dilation and evacuation    . Breast lumpectomy    . Breast biopsy      FAMILY HISTORY Family History  Problem Relation Age of Onset  . CAD Mother     stent older age  . Hypertension Mother   . Breast cancer Mother 62  . Cancer Mother     SKIN and BLADDER   . Heart disease Paternal Grandfather   . Lung cancer Paternal Grandfather   . Hypertension Father   . Heart disease Father 86    CHF   . Dementia Father   . Hearing loss Maternal Grandfather   . Parkinson's disease Paternal Grandmother    the patient's father died with Alzheimer's disease at age 81. The patient's mother was diagnosed with breast cancer at age 48. She died from C. difficile colitis at age 51. The patient has one brother, one sister. There is no history of breast or ovarian cancer in the immediate family.  GYNECOLOGIC HISTORY:  No LMP recorded. Patient is postmenopausal. Menarche age 18, the patient is GX P0. She underwent menopause approximately the year 2000. She did not take hormone replacement. 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 Her husband, [name], used to be in Press photographer (Set designer) but now has had 2 back fusions and is disabled. At home is just the 2 of them plus her dog Max   ADVANCED DIRECTIVES: In place   HEALTH MAINTENANCE: Social History  Substance Use Topics  . Smoking status: Current Every Day Smoker -- 1.00 packs/day for 30 years    Types: Cigarettes  . Smokeless tobacco: Never Used     Comment: Trying to smoke less.   . Alcohol Use: 8.4 oz/week    14 Standard drinks or equivalent per week     Comment: 5 DRINKS A WEEK     Colonoscopy: November 2009  PAP:  Bone density: November 2015:  Lipid panel:  Allergies  Allergen Reactions  . Codeine     REACTION: Rash  . Tetanus Toxoid     REACTION: Shock    Current Outpatient Prescriptions    Medication Sig Dispense Refill  . buPROPion (WELLBUTRIN XL) 150 MG 24 hr tablet Take 1 tablet (150 mg total) by mouth daily. 90 tablet 3  . Calcium Carbonate-Vitamin D 600-400 MG-UNIT tablet Take 1 tablet by mouth 2 (two) times daily.    . Cholecalciferol (VITAMIN D-1000 MAX ST) 1000 UNITS tablet Take 1,000 Units by mouth.    Marland Kitchen ibuprofen (MOTRIN IB) 200 MG tablet Take 1 tablet (200 mg total) by mouth every 6 (six) hours as needed. 30 tablet 0  . LORazepam (ATIVAN) 0.5 MG tablet Take 1 tablet (0.5 mg total) by mouth every 8 (eight) hours. 30 tablet 0  . Misc Natural Products (OSTEO BI-FLEX TRIPLE STRENGTH) TABS Take 1 tablet by mouth 2 (two) times daily.    . ondansetron (ZOFRAN) 8 MG tablet Take 1 tablet (8 mg total) by mouth 2 (two) times daily as needed for nausea or vomiting. (Patient not taking: Reported on 10/21/2014) 20 tablet 1  . prochlorperazine (COMPAZINE) 10 MG tablet Take 1 tablet (10  mg total) by mouth every 6 (six) hours as needed for nausea or vomiting. (Patient not taking: Reported on 10/28/2014) 30 tablet 1   No current facility-administered medications for this visit.    OBJECTIVE: Middle-aged white woman in no acute distress There were no vitals filed for this visit.   There is no weight on file to calculate BMI.      ECOG FS:1 - Symptomatic but completely ambulatory  Sclerae unicteric, EOMs intact Oropharynx clear, dentition in good repair No cervical or supraclavicular adenopathy Lungs no rales or rhonchi Heart regular rate and rhythm Abd soft, nontender, positive bowel sounds MSK no focal spinal tenderness, no upper extremity lymphedema Neuro: nonfocal, well oriented, appropriate affect Breasts: Deferred  LAB RESULTS:  INo results found for: SPEP, UPEP  CBC Latest Ref Rng 11/11/2014 11/04/2014 10/28/2014  WBC 3.9 - 10.3 10e3/uL 5.3 5.0 4.4  Hemoglobin 11.6 - 15.9 g/dL 13.9 13.5 13.7  Hematocrit 34.8 - 46.6 % 42.0 40.1 40.2  Platelets 145 - 400 10e3/uL 195  192 194     CMP Latest Ref Rng 11/11/2014 11/04/2014 10/28/2014  Glucose 70 - 140 mg/dl 147(H) 105 97  BUN 7.0 - 26.0 mg/dL 16.4 10.7 15.4  Creatinine 0.6 - 1.1 mg/dL 0.8 0.8 0.8  Sodium 136 - 145 mEq/L 139 140 141  Potassium 3.5 - 5.1 mEq/L 4.1 4.3 4.5  Chloride 96 - 112 mEq/L - - -  CO2 22 - 29 mEq/L _0 Calcium 8.4 - 10.4 mg/dL 9.7 10.0 9.9  Total Protein 6.4 - 8.3 g/dL 6.8 6.8 6.8  Total Bilirubin 0.20 - 1.20 mg/dL 0.45 0.30 0.35  Alkaline Phos 40 - 150 U/L 49 50 45  AST 5 - 34 U/L _1 ALT 0 - 55 U/L _2 No results found for: LABCA2  No components found for: LABCA125  No results for input(s): INR in the last 168 hours.  Urinalysis    Component Value Date/Time   COLORURINE YELLOW 04/03/2014 1059   APPEARANCEUR CLEAR 04/03/2014 1059   LABSPEC <=1.005* 04/03/2014 1059   PHURINE 7.0 04/03/2014 1059   GLUCOSEU NEGATIVE 04/03/2014 1059   GLUCOSEU NEG 06/08/2013 1016   HGBUR TRACE-INTACT* 04/03/2014 1059   BILIRUBINUR NEGATIVE 04/03/2014 1059   KETONESUR NEGATIVE 04/03/2014 1059   PROTEINUR NEG 06/08/2013 1016   UROBILINOGEN 0.2 04/03/2014 1059   NITRITE NEGATIVE 04/03/2014 1059   LEUKOCYTESUR NEGATIVE 04/03/2014 1059    STUDIES: No results found.  ASSESSMENT: 64 y.o. [place] woman  (1) status post left lumpectomy and sentinel lymph node sampling 1999 for a T1(mic) N0, stage IA invasive breast cancer,  (a) status post adjuvant radiation  (b) status post tamoxifen for 5 years  (2) status post left breast biopsy 07/04/2014 for a clinical T1a N0, stage IA invasive ductal carcinoma, triple positive, with an MIB-1 of 80%  (3) status post left mastectomy and sentinel lymph node sampling 08/06/2014 for a pT1a pN0, stage IA invasive ductal carcinoma, grade 2, with negative margins. 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  (7) tobacco abuse: The patient has been strongly advised to quit  (a) Wellbutrin started 11/11/2014  PLAN: Dashia will proceed with her 10th of 12 planned weekly Taxol today. She is tolerating treatment remarkably well.  Due to her husband's situation causing stress Wellbutrin was added to her med regimen which she is planning to start soon;  hopefully will help with that and also move her towards smoking cessation, something which she is expressing significant interest in at this point.  Don't have a solution for the altered taste problem but that will eventually go away, usually 2-3 months after completion of chemotherapy.  She will need a repeat echocardiogram in December. She will return to see Dr. Jana Hakim in late December, right after the echo and at that point we will consider anastrozole.  She knows to call for any problems that may develop before her next visit here.  Rondel Jumbo, PA-C   11/18/2014 7:37 AM

## 2014-11-25 ENCOUNTER — Other Ambulatory Visit (HOSPITAL_BASED_OUTPATIENT_CLINIC_OR_DEPARTMENT_OTHER): Payer: 59

## 2014-11-25 ENCOUNTER — Other Ambulatory Visit: Payer: Self-pay | Admitting: Oncology

## 2014-11-25 ENCOUNTER — Ambulatory Visit (HOSPITAL_BASED_OUTPATIENT_CLINIC_OR_DEPARTMENT_OTHER): Payer: 59 | Admitting: Nurse Practitioner

## 2014-11-25 ENCOUNTER — Ambulatory Visit (HOSPITAL_BASED_OUTPATIENT_CLINIC_OR_DEPARTMENT_OTHER): Payer: 59

## 2014-11-25 ENCOUNTER — Encounter: Payer: Self-pay | Admitting: Nurse Practitioner

## 2014-11-25 VITALS — BP 112/61 | HR 79 | Temp 97.6°F | Resp 17 | Ht 60.0 in | Wt 118.1 lb

## 2014-11-25 DIAGNOSIS — C50912 Malignant neoplasm of unspecified site of left female breast: Secondary | ICD-10-CM | POA: Diagnosis not present

## 2014-11-25 DIAGNOSIS — C50412 Malignant neoplasm of upper-outer quadrant of left female breast: Secondary | ICD-10-CM

## 2014-11-25 DIAGNOSIS — Z5112 Encounter for antineoplastic immunotherapy: Secondary | ICD-10-CM | POA: Diagnosis not present

## 2014-11-25 DIAGNOSIS — Z5111 Encounter for antineoplastic chemotherapy: Secondary | ICD-10-CM | POA: Diagnosis not present

## 2014-11-25 LAB — COMPREHENSIVE METABOLIC PANEL (CC13)
ALT: 22 U/L (ref 0–55)
AST: 20 U/L (ref 5–34)
Albumin: 3.9 g/dL (ref 3.5–5.0)
Alkaline Phosphatase: 45 U/L (ref 40–150)
Anion Gap: 7 mEq/L (ref 3–11)
BUN: 15.4 mg/dL (ref 7.0–26.0)
CALCIUM: 9.7 mg/dL (ref 8.4–10.4)
CHLORIDE: 102 meq/L (ref 98–109)
CO2: 30 meq/L — AB (ref 22–29)
CREATININE: 0.7 mg/dL (ref 0.6–1.1)
EGFR: 86 mL/min/{1.73_m2} — ABNORMAL LOW (ref >90)
Glucose: 96 mg/dl (ref 70–140)
POTASSIUM: 4.4 meq/L (ref 3.5–5.1)
SODIUM: 138 meq/L (ref 136–145)
Total Bilirubin: 0.34 mg/dL (ref 0.20–1.20)
Total Protein: 6.3 g/dL — ABNORMAL LOW (ref 6.4–8.3)

## 2014-11-25 LAB — CBC WITH DIFFERENTIAL/PLATELET
BASO%: 0.7 % (ref 0.0–2.0)
BASOS ABS: 0 10*3/uL (ref 0.0–0.1)
EOS%: 1.7 % (ref 0.0–7.0)
Eosinophils Absolute: 0.1 10*3/uL (ref 0.0–0.5)
HEMATOCRIT: 39.8 % (ref 34.8–46.6)
HGB: 13.5 g/dL (ref 11.6–15.9)
LYMPH%: 29.4 % (ref 14.0–49.7)
MCH: 34.6 pg — AB (ref 25.1–34.0)
MCHC: 33.8 g/dL (ref 31.5–36.0)
MCV: 102.3 fL — ABNORMAL HIGH (ref 79.5–101.0)
MONO#: 0.3 10*3/uL (ref 0.1–0.9)
MONO%: 7.3 % (ref 0.0–14.0)
NEUT#: 2.4 10*3/uL (ref 1.5–6.5)
NEUT%: 60.9 % (ref 38.4–76.8)
Platelets: 217 10*3/uL (ref 145–400)
RBC: 3.89 10*6/uL (ref 3.70–5.45)
RDW: 14.9 % — AB (ref 11.2–14.5)
WBC: 4 10*3/uL (ref 3.9–10.3)
lymph#: 1.2 10*3/uL (ref 0.9–3.3)

## 2014-11-25 MED ORDER — ACETAMINOPHEN 325 MG PO TABS
650.0000 mg | ORAL_TABLET | Freq: Once | ORAL | Status: AC
Start: 2014-11-25 — End: 2014-11-25
  Administered 2014-11-25: 650 mg via ORAL

## 2014-11-25 MED ORDER — SODIUM CHLORIDE 0.9 % IV SOLN
Freq: Once | INTRAVENOUS | Status: AC
  Administered 2014-11-25: 09:00:00 via INTRAVENOUS

## 2014-11-25 MED ORDER — SODIUM CHLORIDE 0.9 % IV SOLN
Freq: Once | INTRAVENOUS | Status: AC
  Administered 2014-11-25: 10:00:00 via INTRAVENOUS
  Filled 2014-11-25: qty 2

## 2014-11-25 MED ORDER — TRASTUZUMAB CHEMO INJECTION 440 MG
6.0000 mg/kg | Freq: Once | INTRAVENOUS | Status: AC
  Administered 2014-11-25: 315 mg via INTRAVENOUS
  Filled 2014-11-25: qty 15

## 2014-11-25 MED ORDER — FAMOTIDINE IN NACL 20-0.9 MG/50ML-% IV SOLN
20.0000 mg | Freq: Once | INTRAVENOUS | Status: AC
  Administered 2014-11-25: 20 mg via INTRAVENOUS

## 2014-11-25 MED ORDER — ACETAMINOPHEN 325 MG PO TABS
ORAL_TABLET | ORAL | Status: AC
  Filled 2014-11-25: qty 2

## 2014-11-25 MED ORDER — FAMOTIDINE IN NACL 20-0.9 MG/50ML-% IV SOLN
INTRAVENOUS | Status: AC
  Filled 2014-11-25: qty 50

## 2014-11-25 MED ORDER — PACLITAXEL CHEMO INJECTION 300 MG/50ML
80.0000 mg/m2 | Freq: Once | INTRAVENOUS | Status: AC
  Administered 2014-11-25: 126 mg via INTRAVENOUS
  Filled 2014-11-25: qty 21

## 2014-11-25 NOTE — Progress Notes (Signed)
Tanya [name]  Telephone:(336) [phone] Fax:(336) [phone]     ID: JORI FRERICHS DOB: Jan 25, 1950  MR#: [id number]  OEV#:[id number]  Patient Care Team: Rowe Clack, MD as PCP - General [name], MD (Gastroenterology) [name], MD (Obstetrics and Gynecology) Chauncey Cruel, MD as Consulting Physician (Oncology) [name], MD as Referring Physician (Surgery) PCP: Gwendolyn Grant, MD OTHER MD: [name] MD, [name] MD  CHIEF COMPLAINT: Estrogen receptor positive breast cancer  CURRENT TREATMENT: Paclitaxel plus trastuzumab, to be followed by anastrozole   BREAST CANCER HISTORY: From the original intake note:  At the age of 14, the patient underwent lumpectomy and sentinel lymph node biopsy for a left sided T1(mic) N0, stage IA invasive breast cancer. She completed whole breast irradiation and received tamoxifen for 5 years. She has continued on raloxifene.  62/15/2016 showed calcifications at the breast Center with tomography 06/19/2014 showed calcifications in the left breast, which were further evaluated with diagnostic left mammography 06/26/2014. Breast density was category C. There was a 5 mm group of heterogeneous calcifications in the outer left breast. This was biopsied 07/04/2014, and showed ([id number]) invasive and in situ ductal carcinoma, grade 2. There was a minimal amount of invasive tumor, but it did allow for prognostic panel, which showed the cancer to be estrogen receptor 90% positive, progesterone receptor 80% positive, both with strong staining intensity, with an MIB-1 of 80%, and HER-2 amplified, with a signals ratio of 8.09, the number per cell being 8.90.  The patient then was evaluated at [facility] by Dr. Elisha Headland and, given the prior history of radiation, mastectomy with repeat sentinel lymph node sampling was suggested. This was performed 08/08/2014, and showed ([id number]) invasive ductal  carcinoma, grade 1, measuring 0.3 cm. Margins were negative. All 3 sentinel lymph nodes sampled were negative. Repeat prognostic panel was obtained on the DCIS only, and found the tumor to be estrogen receptor positive at 65% and progesterone receptor positive at 75%.  The patient's subsequent history is as detailed below.   INTERVAL HISTORY: Houda returns today for follow-up of her breast cancer. Today is day 1 cycle 11 of her weekly paclitaxel, with trastuzumab given every 3 weeks, also due to day.  REVIEW OF SYSTEMS: Bethani denies fevers, chills, nausea, vomiting, or changes in bowel or bladder habits. She has a good appetite despite taste changes. She denies mouth sores, rashes, or neuropathy symptoms. She has not yet started on Wellbutrin and continues to smoke. She denies shortness of breath, chest pain, or palpitations but does have a mild dry cough. A detailed review of systems is otherwise stable.  PAST MEDICAL HISTORY: Past Medical History  Diagnosis Date  . COPD, mild ([place])   . Smoker   . Osteopenia     DEXA 10/27/11: -1.4, no change from 10/2009  . Shingles   . Breast cancer ([place])     Radiation and lumpectomy  . Family history of breast cancer   . Family history of uterine cancer     PAST SURGICAL HISTORY: Past Surgical History  Procedure Laterality Date  . Tonsillectomy and adenoidectomy    . Dilation and evacuation    . Breast lumpectomy    . Breast biopsy      FAMILY HISTORY Family History  Problem Relation Age of Onset  . CAD Mother     stent older age  . Hypertension Mother   . Breast cancer Mother 23  . Cancer Mother  SKIN and BLADDER   . Heart disease Paternal Grandfather   . Lung cancer Paternal Grandfather   . Hypertension Father   . Heart disease Father 20    CHF   . Dementia Father   . Hearing loss Maternal Grandfather   . Parkinson's disease Paternal Grandmother    the patient's father died with Alzheimer's disease at age 66. The  patient's mother was diagnosed with breast cancer at age 59. She died from C. difficile colitis at age 73. The patient has one brother, one sister. There is no history of breast or ovarian cancer in the immediate family.  GYNECOLOGIC HISTORY:  No LMP recorded. Patient is postmenopausal. Menarche age 64, the patient is GX P0. She underwent menopause approximately the year 2000. She did not take hormone replacement. 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 Her husband, [name], used to be in Press photographer (Set designer) but now has had 2 back fusions and is disabled. At home is just the 2 of them plus her dog Max   ADVANCED DIRECTIVES: In place   HEALTH MAINTENANCE: Social History  Substance Use Topics  . Smoking status: Current Every Day Smoker -- 1.00 packs/day for 30 years    Types: Cigarettes  . Smokeless tobacco: Never Used     Comment: Trying to smoke less.   . Alcohol Use: 8.4 oz/week    14 Standard drinks or equivalent per week     Comment: 5 DRINKS A WEEK     Colonoscopy: November 2009  PAP:  Bone density: November 2015:  Lipid panel:  Allergies  Allergen Reactions  . Codeine     REACTION: Rash  . Tetanus Toxoid     REACTION: Shock    Current Outpatient Prescriptions  Medication Sig Dispense Refill  . buPROPion (WELLBUTRIN XL) 150 MG 24 hr tablet Take 1 tablet (150 mg total) by mouth daily. 90 tablet 3  . Calcium Carbonate-Vitamin D 600-400 MG-UNIT tablet Take 1 tablet by mouth 2 (two) times daily.    . Cholecalciferol (VITAMIN D-1000 MAX ST) 1000 UNITS tablet Take 1,000 Units by mouth.    Marland Kitchen LORazepam (ATIVAN) 0.5 MG tablet Take 1 tablet (0.5 mg total) by mouth every 8 (eight) hours. 30 tablet 0  . Misc Natural Products (OSTEO BI-FLEX TRIPLE STRENGTH) TABS Take 1 tablet by mouth 2 (two) times daily.    Marland Kitchen ibuprofen (MOTRIN IB) 200 MG tablet  Take 1 tablet (200 mg total) by mouth every 6 (six) hours as needed. 30 tablet 0  . ondansetron (ZOFRAN) 8 MG tablet Take 1 tablet (8 mg total) by mouth 2 (two) times daily as needed for nausea or vomiting. (Patient not taking: Reported on 10/21/2014) 20 tablet 1  . prochlorperazine (COMPAZINE) 10 MG tablet Take 1 tablet (10 mg total) by mouth every 6 (six) hours as needed for nausea or vomiting. (Patient not taking: Reported on 10/28/2014) 30 tablet 1   No current facility-administered medications for this visit.    OBJECTIVE: Middle-aged white woman in no acute distress Filed Vitals:   11/25/14 0827  BP: 112/61  Pulse: 79  Temp: 97.6 F (36.4 C)  Resp: 17     Body mass index is 23.06 kg/(m^2).      ECOG FS:1 - Symptomatic but completely ambulatory  Skin: warm, dry  HEENT: sclerae anicteric, conjunctivae pink, oropharynx clear. No thrush or mucositis.  Lymph Nodes: No cervical or supraclavicular lymphadenopathy  Lungs: clear to auscultation bilaterally, no rales, wheezes, or rhonci  Heart: regular rate and rhythm  Abdomen: round, soft, non tender, positive bowel sounds  Musculoskeletal: No focal spinal tenderness, no peripheral edema  Neuro: non focal, well oriented, positive affect  Breasts: deferred  LAB RESULTS:  INo results found for: SPEP, UPEP  CBC Latest Ref Rng 11/25/2014 11/18/2014 11/11/2014  WBC 3.9 - 10.3 10e3/uL 4.0 4.4 5.3  Hemoglobin 11.6 - 15.9 g/dL 13.5 13.4 13.9  Hematocrit 34.8 - 46.6 % 39.8 40.4 42.0  Platelets 145 - 400 10e3/uL 217 201 195     CMP Latest Ref Rng 11/18/2014 11/11/2014 11/04/2014  Glucose 70 - 140 mg/dl 91 147(H) 105  BUN 7.0 - 26.0 mg/dL 15.8 16.4 10.7  Creatinine 0.6 - 1.1 mg/dL 0.8 0.8 0.8  Sodium 136 - 145 mEq/L 142 139 140  Potassium 3.5 - 5.1 mEq/L 4.8 4.1 4.3  Chloride 96 - 112 mEq/L - - -  CO2 22 - 29 mEq/L 30(H) 27 28  Calcium 8.4 - 10.4 mg/dL 10.0 9.7 10.0  Total Protein 6.4 - 8.3 g/dL 6.6 6.8 6.8  Total Bilirubin 0.20 -  1.20 mg/dL 0.35 0.45 0.30  Alkaline Phos 40 - 150 U/L 45 49 50  AST 5 - 34 U/L '22 21 20  ' ALT 0 - 55 U/L '25 23 20     ' No results found for: LABCA2  No components found for: LABCA125  No results for input(s): INR in the last 168 hours.  Urinalysis    Component Value Date/Time   COLORURINE YELLOW 04/03/2014 1059   APPEARANCEUR CLEAR 04/03/2014 1059   LABSPEC <=1.005* 04/03/2014 1059   PHURINE 7.0 04/03/2014 1059   GLUCOSEU NEGATIVE 04/03/2014 1059   GLUCOSEU NEG 06/08/2013 1016   HGBUR TRACE-INTACT* 04/03/2014 1059   BILIRUBINUR NEGATIVE 04/03/2014 1059   KETONESUR NEGATIVE 04/03/2014 1059   PROTEINUR NEG 06/08/2013 1016   UROBILINOGEN 0.2 04/03/2014 1059   NITRITE NEGATIVE 04/03/2014 1059   LEUKOCYTESUR NEGATIVE 04/03/2014 1059    STUDIES: No results found.  ASSESSMENT: 64 y.o. [place] woman  (1) status post left lumpectomy and sentinel lymph node sampling 1999 for a T1(mic) N0, stage IA invasive breast cancer,  (a) status post adjuvant radiation  (b) status post tamoxifen for 5 years  (2) status post left breast biopsy 07/04/2014 for a clinical T1a N0, stage IA invasive ductal carcinoma, triple positive, with an MIB-1 of 80%  (3) status post left mastectomy and sentinel lymph node sampling 08/06/2014 for a pT1a pN0, stage IA invasive ductal carcinoma, grade 2, with negative margins. 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  (7) tobacco abuse: The patient has been strongly advised to quit  (a)  Wellbutrin started 11/11/2014  PLAN: Yolander continues to manage treatment well with no new complaints. The labs were reviewed in detail and were stable. She will proceed with cycle 11 of paclitaxel along with her next dose of trastuzumab.   Nashali will return in 1 week for her 12th and  final cycle of paclitaxel. She will receive trastuzumab alone every 3 weeks thereafter. She understands and agrees with this plan. She knows the goal of treatment in her case is cure. She has been encouraged to call with any issues that might arise before her next visit here.   Laurie [name], NP   11/25/2014 8:50 AM

## 2014-11-25 NOTE — Patient Instructions (Signed)
[facility] Discharge Instructions for Patients Receiving Chemotherapy  Today you received the following chemotherapy agents Taxol  To help prevent nausea and vomiting after your treatment, we encourage you to take your nausea medication   If you develop nausea and vomiting that is not controlled by your nausea medication, call the clinic.   BELOW ARE SYMPTOMS THAT SHOULD BE REPORTED IMMEDIATELY:  *FEVER GREATER THAN 100.5 F  *CHILLS WITH OR WITHOUT FEVER  NAUSEA AND VOMITING THAT IS NOT CONTROLLED WITH YOUR NAUSEA MEDICATION  *UNUSUAL SHORTNESS OF BREATH  *UNUSUAL BRUISING OR BLEEDING  TENDERNESS IN MOUTH AND THROAT WITH OR WITHOUT PRESENCE OF ULCERS  *URINARY PROBLEMS  *BOWEL PROBLEMS  UNUSUAL RASH Items with * indicate a potential emergency and should be followed up as soon as possible.  Feel free to call the clinic you have any questions or concerns. The clinic phone number is [phone].  Please show the CHEMO ALERT CARD at check-in to the Emergency Department and triage nurse.   

## 2014-11-26 ENCOUNTER — Other Ambulatory Visit: Payer: Self-pay | Admitting: Nurse Practitioner

## 2014-11-30 ENCOUNTER — Encounter: Payer: Self-pay | Admitting: Oncology

## 2014-12-02 ENCOUNTER — Ambulatory Visit (HOSPITAL_BASED_OUTPATIENT_CLINIC_OR_DEPARTMENT_OTHER): Payer: 59

## 2014-12-02 ENCOUNTER — Other Ambulatory Visit: Payer: Self-pay | Admitting: Nurse Practitioner

## 2014-12-02 ENCOUNTER — Other Ambulatory Visit: Payer: Self-pay | Admitting: *Deleted

## 2014-12-02 ENCOUNTER — Ambulatory Visit (HOSPITAL_BASED_OUTPATIENT_CLINIC_OR_DEPARTMENT_OTHER): Payer: 59 | Admitting: Nurse Practitioner

## 2014-12-02 ENCOUNTER — Encounter: Payer: Self-pay | Admitting: Nurse Practitioner

## 2014-12-02 ENCOUNTER — Other Ambulatory Visit (HOSPITAL_BASED_OUTPATIENT_CLINIC_OR_DEPARTMENT_OTHER): Payer: 59

## 2014-12-02 VITALS — BP 116/64 | HR 84 | Temp 98.5°F | Resp 18 | Ht 60.0 in | Wt 118.9 lb

## 2014-12-02 DIAGNOSIS — C50412 Malignant neoplasm of upper-outer quadrant of left female breast: Secondary | ICD-10-CM

## 2014-12-02 DIAGNOSIS — Z5111 Encounter for antineoplastic chemotherapy: Secondary | ICD-10-CM | POA: Diagnosis not present

## 2014-12-02 DIAGNOSIS — G620 Drug-induced polyneuropathy: Secondary | ICD-10-CM

## 2014-12-02 DIAGNOSIS — T451X5A Adverse effect of antineoplastic and immunosuppressive drugs, initial encounter: Secondary | ICD-10-CM

## 2014-12-02 DIAGNOSIS — C50912 Malignant neoplasm of unspecified site of left female breast: Secondary | ICD-10-CM

## 2014-12-02 LAB — COMPREHENSIVE METABOLIC PANEL (CC13)
ALBUMIN: 3.8 g/dL (ref 3.5–5.0)
ALK PHOS: 44 U/L (ref 40–150)
ALT: 26 U/L (ref 0–55)
ANION GAP: 9 meq/L (ref 3–11)
AST: 23 U/L (ref 5–34)
BUN: 16.1 mg/dL (ref 7.0–26.0)
CO2: 30 mEq/L — ABNORMAL HIGH (ref 22–29)
Calcium: 9.6 mg/dL (ref 8.4–10.4)
Chloride: 102 mEq/L (ref 98–109)
Creatinine: 0.8 mg/dL (ref 0.6–1.1)
EGFR: 82 mL/min/{1.73_m2} — AB (ref >90)
GLUCOSE: 93 mg/dL (ref 70–140)
POTASSIUM: 4.2 meq/L (ref 3.5–5.1)
SODIUM: 141 meq/L (ref 136–145)
Total Bilirubin: 0.31 mg/dL (ref 0.20–1.20)
Total Protein: 6.7 g/dL (ref 6.4–8.3)

## 2014-12-02 LAB — CBC WITH DIFFERENTIAL/PLATELET
BASO%: 0.4 % (ref 0.0–2.0)
BASOS ABS: 0 10*3/uL (ref 0.0–0.1)
EOS ABS: 0.1 10*3/uL (ref 0.0–0.5)
EOS%: 1.6 % (ref 0.0–7.0)
HCT: 38.8 % (ref 34.8–46.6)
HEMOGLOBIN: 13.2 g/dL (ref 11.6–15.9)
LYMPH%: 23.8 % (ref 14.0–49.7)
MCH: 35 pg — AB (ref 25.1–34.0)
MCHC: 34 g/dL (ref 31.5–36.0)
MCV: 102.9 fL — AB (ref 79.5–101.0)
MONO#: 0.4 10*3/uL (ref 0.1–0.9)
MONO%: 7.3 % (ref 0.0–14.0)
NEUT#: 3.4 10*3/uL (ref 1.5–6.5)
NEUT%: 66.9 % (ref 38.4–76.8)
PLATELETS: 201 10*3/uL (ref 145–400)
RBC: 3.77 10*6/uL (ref 3.70–5.45)
RDW: 15.1 % — ABNORMAL HIGH (ref 11.2–14.5)
WBC: 5 10*3/uL (ref 3.9–10.3)
lymph#: 1.2 10*3/uL (ref 0.9–3.3)

## 2014-12-02 MED ORDER — SODIUM CHLORIDE 0.9 % IV SOLN
Freq: Once | INTRAVENOUS | Status: AC
  Administered 2014-12-02: 09:00:00 via INTRAVENOUS

## 2014-12-02 MED ORDER — FAMOTIDINE IN NACL 20-0.9 MG/50ML-% IV SOLN
20.0000 mg | Freq: Once | INTRAVENOUS | Status: AC
  Administered 2014-12-02: 20 mg via INTRAVENOUS

## 2014-12-02 MED ORDER — SODIUM CHLORIDE 0.9 % IV SOLN
Freq: Once | INTRAVENOUS | Status: AC
  Administered 2014-12-02: 10:00:00 via INTRAVENOUS
  Filled 2014-12-02: qty 2

## 2014-12-02 MED ORDER — PACLITAXEL CHEMO INJECTION 300 MG/50ML
80.0000 mg/m2 | Freq: Once | INTRAVENOUS | Status: AC
  Administered 2014-12-02: 126 mg via INTRAVENOUS
  Filled 2014-12-02: qty 21

## 2014-12-02 MED ORDER — FAMOTIDINE IN NACL 20-0.9 MG/50ML-% IV SOLN
INTRAVENOUS | Status: AC
  Filled 2014-12-02: qty 50

## 2014-12-02 NOTE — Patient Instructions (Signed)
[facility] Discharge Instructions for Patients Receiving Chemotherapy  Today you received the following chemotherapy agents:  Taxol  To help prevent nausea and vomiting after your treatment, we encourage you to take your nausea medication as prescribed.   If you develop nausea and vomiting that is not controlled by your nausea medication, call the clinic.   BELOW ARE SYMPTOMS THAT SHOULD BE REPORTED IMMEDIATELY:  *FEVER GREATER THAN 100.5 F  *CHILLS WITH OR WITHOUT FEVER  NAUSEA AND VOMITING THAT IS NOT CONTROLLED WITH YOUR NAUSEA MEDICATION  *UNUSUAL SHORTNESS OF BREATH  *UNUSUAL BRUISING OR BLEEDING  TENDERNESS IN MOUTH AND THROAT WITH OR WITHOUT PRESENCE OF ULCERS  *URINARY PROBLEMS  *BOWEL PROBLEMS  UNUSUAL RASH Items with * indicate a potential emergency and should be followed up as soon as possible.  Feel free to call the clinic you have any questions or concerns. The clinic phone number is [phone].  Please show the CHEMO ALERT CARD at check-in to the Emergency Department and triage nurse.   

## 2014-12-02 NOTE — Progress Notes (Signed)
[place]  [phone] [phone]     ID: Tanya [name] DOB: [birth year]  MR#: [id number]  DZH#:[id number]  Patient Care Team: Rowe Clack, MD as PCP - General [name], MD (Gastroenterology) [name], MD (Obstetrics and Gynecology) Chauncey Cruel, MD as Consulting Physician (Oncology) [name], MD as Referring Physician (Surgery) PCP: Gwendolyn Grant, MD OTHER MD: [name] MD, [name] MD  CHIEF COMPLAINT: Estrogen receptor positive breast cancer  CURRENT TREATMENT: Paclitaxel plus trastuzumab, to be followed by anastrozole   BREAST CANCER HISTORY: From the original intake note:  At the age of 64, the patient underwent lumpectomy and sentinel lymph node biopsy for a left sided T1(mic) N0, stage IA invasive breast cancer. She completed whole breast irradiation and received tamoxifen for 5 years. She has continued on raloxifene.  More recently, screening mammography at the breast Center with tomography 06/19/2014 showed calcifications in the left breast, which were further evaluated with diagnostic left mammography 06/26/2014. Breast density was category C. There was a 5 mm group of heterogeneous calcifications in the outer left breast. This was biopsied 07/04/2014, and showed ([id number]) invasive and in situ ductal carcinoma, grade 2. There was a minimal amount of invasive tumor, but it did allow for prognostic panel, which showed the cancer to be estrogen receptor 90% positive, progesterone receptor 80% positive, both with strong staining intensity, with an MIB-1 of 80%, and HER-2 amplified, with a signals ratio of 8.09, the number per cell being 8.90.  The patient then was evaluated at [facility] by Dr. Elisha Headland and, given the prior history of radiation, mastectomy with repeat sentinel lymph node sampling was suggested. This was performed 08/08/2014, and showed ([id number]) invasive ductal  carcinoma, grade 1, measuring 0.3 cm. Margins were negative. All 3 sentinel lymph nodes sampled were negative. Repeat prognostic panel was obtained on the DCIS only, and found the tumor to be estrogen receptor positive at 65% and progesterone receptor positive at 75%.  The patient's subsequent history is as detailed below.   INTERVAL HISTORY: Tanya [name] returns today for follow-up of her breast cancer. Today she is due for cycle 12 of 12 her weekly doses of paclitaxel, with trastuzumab given every 3 weeks (next due 12/12). New since last night is faint numbness to her fingertips and toes. This is still present today, but is faint. It does not impede her ability to grip or walk.   REVIEW OF SYSTEMS: Tanya [name] denies fevers, chills, nausea, vomiting, or changes in bowel or bladder habits. She has a good appetite despite taste changes. She denies mouth sores, rashes, or neuropathy symptoms. She has not yet started on Wellbutrin and continues to smoke. She denies shortness of breath, chest pain, or palpitations but does have a mild dry cough. A detailed review of systems is otherwise stable.  PAST MEDICAL HISTORY: Past Medical History  Diagnosis Date  . COPD, mild ([place])   . Smoker   . Osteopenia     DEXA 10/27/11: -1.4, no change from 10/2009  . Shingles   . Breast cancer ([place])     Radiation and lumpectomy  . Family history of breast cancer   . Family history of uterine cancer     PAST SURGICAL HISTORY: Past Surgical History  Procedure Laterality Date  . Tonsillectomy and adenoidectomy    . Dilation and evacuation    . Breast lumpectomy    . Breast biopsy      FAMILY HISTORY Family History  Problem Relation Age of Onset  . CAD Mother     stent older age  . Hypertension Mother   . Breast cancer Mother 57  . Cancer Mother     SKIN and BLADDER   . Heart disease Paternal Grandfather   . Lung cancer Paternal Grandfather   . Hypertension Father   . Heart disease Father 48    CHF   .  Dementia Father   . Hearing loss Maternal Grandfather   . Parkinson's disease Paternal Grandmother    the patient's father died with Alzheimer's disease at age 30. The patient's mother was diagnosed with breast cancer at age 30. She died from C. difficile colitis at age 38. The patient has one brother, one sister. There is no history of breast or ovarian cancer in the immediate family.  GYNECOLOGIC HISTORY:  No LMP recorded. Patient is postmenopausal. Menarche age 66, the patient is GX P0. She underwent menopause approximately the year 2000. She did not take hormone replacement. 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 Her husband, [name], used to be in Press photographer (Set designer) but now has had 2 back fusions and is disabled. At home is just the 2 of them plus her dog Max   ADVANCED DIRECTIVES: In place   HEALTH MAINTENANCE: Social History  Substance Use Topics  . Smoking status: Current Every Day Smoker -- 1.00 packs/day for 30 years    Types: Cigarettes  . Smokeless tobacco: Never Used     Comment: Trying to smoke less.   . Alcohol Use: 8.4 oz/week    14 Standard drinks or equivalent per week     Comment: 5 DRINKS A WEEK     Colonoscopy: November 2009  PAP:  Bone density: November 2015:  Lipid panel:  Allergies  Allergen Reactions  . Codeine     REACTION: Rash  . Tetanus Toxoid     REACTION: Shock    Current Outpatient Prescriptions  Medication Sig Dispense Refill  . buPROPion (WELLBUTRIN XL) 150 MG 24 hr tablet Take 1 tablet (150 mg total) by mouth daily. 90 tablet 3  . Calcium Carbonate-Vitamin D 600-400 MG-UNIT tablet Take 1 tablet by mouth 2 (two) times daily.    . Cholecalciferol (VITAMIN D-1000 MAX ST) 1000 UNITS tablet Take 1,000 Units by mouth.    Marland Kitchen ibuprofen (MOTRIN IB) 200 MG tablet Take 1 tablet (200 mg total) by mouth every 6  (six) hours as needed. 30 tablet 0  . Misc Natural Products (OSTEO BI-FLEX TRIPLE STRENGTH) TABS Take 1 tablet by mouth 2 (two) times daily.    Marland Kitchen LORazepam (ATIVAN) 0.5 MG tablet Take 1 tablet (0.5 mg total) by mouth every 8 (eight) hours. (Patient not taking: Reported on 12/02/2014) 30 tablet 0  . ondansetron (ZOFRAN) 8 MG tablet Take 1 tablet (8 mg total) by mouth 2 (two) times daily as needed for nausea or vomiting. (Patient not taking: Reported on 10/21/2014) 20 tablet 1  . prochlorperazine (COMPAZINE) 10 MG tablet Take 1 tablet (10 mg total) by mouth every 6 (six) hours as needed for nausea or vomiting. (Patient not taking: Reported on 10/28/2014) 30 tablet 1   No current facility-administered medications for this visit.   Facility-Administered Medications Ordered in Other Visits  Medication Dose Route Frequency Provider Last Rate Last Dose  . famotidine (PEPCID) IVPB 20 mg premix  20 mg  Intravenous Once Chauncey Cruel, MD   20 mg at 12/02/14 0867  . ondansetron (ZOFRAN) 4 mg, dexamethasone (DECADRON) 4 mg in sodium chloride 0.9 % 50 mL IVPB   Intravenous Once Chauncey Cruel, MD      . PACLitaxel (TAXOL) 126 mg in dextrose 5 % 250 mL chemo infusion (</= 37m/m2)  80 mg/m2 (Treatment Plan Actual) Intravenous Once GChauncey Cruel MD        OBJECTIVE: Middle-aged white woman in no acute distress Filed Vitals:   12/02/14 0828  BP: 116/64  Pulse: 84  Temp: 98.5 F (36.9 C)  Resp: 18     Body mass index is 23.22 kg/(m^2).      ECOG FS:1 - Symptomatic but completely ambulatory  Skin: warm, dry  HEENT: sclerae anicteric, conjunctivae pink, oropharynx clear. No thrush or mucositis.  Lymph Nodes: No cervical or supraclavicular lymphadenopathy  Lungs: clear to auscultation bilaterally, no rales, wheezes, or rhonci  Heart: regular rate and rhythm  Abdomen: round, soft, non tender, positive bowel sounds  Musculoskeletal: No focal spinal tenderness, no peripheral edema  Neuro: non  focal, well oriented, positive affect  Breasts: deferred  LAB RESULTS:  INo results found for: SPEP, UPEP  CBC Latest Ref Rng 12/02/2014 11/25/2014 11/18/2014  WBC 3.9 - 10.3 10e3/uL 5.0 4.0 4.4  Hemoglobin 11.6 - 15.9 g/dL 13.2 13.5 13.4  Hematocrit 34.8 - 46.6 % 38.8 39.8 40.4  Platelets 145 - 400 10e3/uL 201 217 201     CMP Latest Ref Rng 12/02/2014 11/25/2014 11/18/2014  Glucose 70 - 140 mg/dl 93 96 91  BUN 7.0 - 26.0 mg/dL 16.1 15.4 15.8  Creatinine 0.6 - 1.1 mg/dL 0.8 0.7 0.8  Sodium 136 - 145 mEq/L 141 138 142  Potassium 3.5 - 5.1 mEq/L 4.2 4.4 4.8  Chloride 96 - 112 mEq/L - - -  CO2 22 - 29 mEq/L 30(H) 30(H) 30(H)  Calcium 8.4 - 10.4 mg/dL 9.6 9.7 10.0  Total Protein 6.4 - 8.3 g/dL 6.7 6.3(L) 6.6  Total Bilirubin 0.20 - 1.20 mg/dL 0.31 0.34 0.35  Alkaline Phos 40 - 150 U/L 44 45 45  AST 5 - 34 U/L _0 ALT 0 - 55 U/L _1 No results found for: LABCA2  No components found for: [id number] No results for input(s): INR in the last 168 hours.  Urinalysis    Component Value Date/Time   COLORURINE YELLOW 04/03/2014 1059   APPEARANCEUR CLEAR 04/03/2014 1059   LABSPEC <=1.005* 04/03/2014 1059   PHURINE 7.0 04/03/2014 1059   GLUCOSEU NEGATIVE 04/03/2014 1059   GLUCOSEU NEG 06/08/2013 1016   HGBUR TRACE-INTACT* 04/03/2014 1059   BILIRUBINUR NEGATIVE 04/03/2014 1059   KETONESUR NEGATIVE 04/03/2014 1059   PROTEINUR NEG 06/08/2013 1016   UROBILINOGEN 0.2 04/03/2014 1059   NITRITE NEGATIVE 04/03/2014 1059   LEUKOCYTESUR NEGATIVE 04/03/2014 1059    STUDIES: No results found.  ASSESSMENT: 64y.o. Tanya [name] woman  (1) status post left lumpectomy and sentinel lymph node sampling 1999 for a T1(mic) N0, stage IA invasive breast cancer,  (a) status post adjuvant radiation  (b) status post tamoxifen for 5 years  (2) status post left breast biopsy 07/04/2014 for a clinical T1a N0, stage IA invasive ductal carcinoma, triple positive, with an MIB-1 of  80%  (3) status post left mastectomy and sentinel lymph node sampling 08/06/2014 for a pT1a pN0, stage IA invasive ductal carcinoma, grade 2, with negative margins. 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  (7) tobacco abuse: The patient has been strongly advised to quit  (a) Wellbutrin started 11/11/2014  PLAN: Tanya [name] and I discussed her new neuropathy symptoms. She understands that to some extent neuropathy can be irreversible. Generally, I would advise holding for 1 week before proceeding with the next treatment, but given the fact that the symptoms are very light and under 24hrs old the patient has decided she would like to complete her 12th and final cycle of paclitaxel today. I advised her to start a b-complex vitamin for now and continue to monitor her fingers and toes closely.   Tanya [name] will continue trastuzumab every 3 weeks through September 2017 with echocardiograms every 3 months. Her next one is scheduled for 12/16/14.   She will return in late December for follow up with Dr. Jana Hakim. At that time they will discuss antiestrogen therapy. She understands and agrees with this plan. She knows the goal of treatment in her case is cure. She has been encouraged to call with any issues that might arise before her next visit here.   Laurie [name], NP   12/02/2014 9:35 AM

## 2014-12-03 ENCOUNTER — Encounter: Payer: Self-pay | Admitting: Oncology

## 2014-12-04 ENCOUNTER — Encounter: Payer: Self-pay | Admitting: Oncology

## 2014-12-09 ENCOUNTER — Telehealth: Payer: Self-pay | Admitting: Internal Medicine

## 2014-12-09 NOTE — Telephone Encounter (Signed)
Patient is calling to follow up on message she left you on Friday. There is no notation or new referral in the system that i can see.

## 2014-12-09 NOTE — Telephone Encounter (Signed)
Done by [place]

## 2014-12-11 ENCOUNTER — Encounter: Payer: Self-pay | Admitting: *Deleted

## 2014-12-16 ENCOUNTER — Other Ambulatory Visit: Payer: Self-pay | Admitting: Oncology

## 2014-12-16 ENCOUNTER — Ambulatory Visit: Payer: 59

## 2014-12-16 ENCOUNTER — Ambulatory Visit (HOSPITAL_BASED_OUTPATIENT_CLINIC_OR_DEPARTMENT_OTHER): Payer: 59

## 2014-12-16 ENCOUNTER — Other Ambulatory Visit (HOSPITAL_BASED_OUTPATIENT_CLINIC_OR_DEPARTMENT_OTHER): Payer: 59

## 2014-12-16 ENCOUNTER — Ambulatory Visit (HOSPITAL_COMMUNITY)
Admission: RE | Admit: 2014-12-16 | Discharge: 2014-12-16 | Disposition: A | Discharge status: Home / Self Care | Payer: 59 | Source: Ambulatory Visit | Attending: Oncology | Admitting: Oncology

## 2014-12-16 VITALS — BP 120/65 | HR 74 | Temp 98.4°F | Resp 18

## 2014-12-16 DIAGNOSIS — C50412 Malignant neoplasm of upper-outer quadrant of left female breast: Secondary | ICD-10-CM

## 2014-12-16 DIAGNOSIS — Z5111 Encounter for antineoplastic chemotherapy: Secondary | ICD-10-CM

## 2014-12-16 DIAGNOSIS — Z720 Tobacco use: Secondary | ICD-10-CM | POA: Insufficient documentation

## 2014-12-16 DIAGNOSIS — Z23 Encounter for immunization: Secondary | ICD-10-CM

## 2014-12-16 DIAGNOSIS — J449 Chronic obstructive pulmonary disease, unspecified: Secondary | ICD-10-CM | POA: Diagnosis not present

## 2014-12-16 DIAGNOSIS — C50912 Malignant neoplasm of unspecified site of left female breast: Secondary | ICD-10-CM

## 2014-12-16 LAB — CBC WITH DIFFERENTIAL/PLATELET
BASO%: 0.5 % (ref 0.0–2.0)
BASOS ABS: 0 10*3/uL (ref 0.0–0.1)
EOS ABS: 0 10*3/uL (ref 0.0–0.5)
EOS%: 0.7 % (ref 0.0–7.0)
HEMATOCRIT: 43.6 % (ref 34.8–46.6)
HEMOGLOBIN: 14.6 g/dL (ref 11.6–15.9)
LYMPH#: 1.4 10*3/uL (ref 0.9–3.3)
LYMPH%: 21.6 % (ref 14.0–49.7)
MCH: 34.6 pg — AB (ref 25.1–34.0)
MCHC: 33.5 g/dL (ref 31.5–36.0)
MCV: 103.2 fL — AB (ref 79.5–101.0)
MONO#: 0.6 10*3/uL (ref 0.1–0.9)
MONO%: 8.7 % (ref 0.0–14.0)
NEUT%: 68.5 % (ref 38.4–76.8)
NEUTROS ABS: 4.4 10*3/uL (ref 1.5–6.5)
PLATELETS: 208 10*3/uL (ref 145–400)
RBC: 4.22 10*6/uL (ref 3.70–5.45)
RDW: 15 % — AB (ref 11.2–14.5)
WBC: 6.5 10*3/uL (ref 3.9–10.3)

## 2014-12-16 LAB — COMPREHENSIVE METABOLIC PANEL
ALBUMIN: 4.1 g/dL (ref 3.5–5.0)
ALK PHOS: 58 U/L (ref 40–150)
ALT: 18 U/L (ref 0–55)
AST: 20 U/L (ref 5–34)
Anion Gap: 10 mEq/L (ref 3–11)
BILIRUBIN TOTAL: 0.43 mg/dL (ref 0.20–1.20)
BUN: 16.3 mg/dL (ref 7.0–26.0)
CALCIUM: 9.8 mg/dL (ref 8.4–10.4)
CO2: 29 mEq/L (ref 22–29)
Chloride: 102 mEq/L (ref 98–109)
Creatinine: 0.8 mg/dL (ref 0.6–1.1)
EGFR: 84 mL/min/{1.73_m2} — AB (ref >90)
Glucose: 78 mg/dl (ref 70–140)
POTASSIUM: 4 meq/L (ref 3.5–5.1)
Sodium: 141 mEq/L (ref 136–145)
TOTAL PROTEIN: 7.3 g/dL (ref 6.4–8.3)

## 2014-12-16 MED ORDER — ACETAMINOPHEN 325 MG PO TABS
650.0000 mg | ORAL_TABLET | Freq: Once | ORAL | Status: AC
  Administered 2014-12-16: 650 mg via ORAL

## 2014-12-16 MED ORDER — ACETAMINOPHEN 325 MG PO TABS
ORAL_TABLET | ORAL | Status: AC
  Filled 2014-12-16: qty 2

## 2014-12-16 MED ORDER — INFLUENZA VAC SPLIT QUAD 0.5 ML IM SUSY
0.5000 mL | PREFILLED_SYRINGE | Freq: Once | INTRAMUSCULAR | Status: AC
  Administered 2014-12-16: 0.5 mL via INTRAMUSCULAR
  Filled 2014-12-16: qty 0.5

## 2014-12-16 MED ORDER — SODIUM CHLORIDE 0.9 % IV SOLN
Freq: Once | INTRAVENOUS | Status: AC
  Administered 2014-12-16: 11:00:00 via INTRAVENOUS

## 2014-12-16 MED ORDER — SODIUM CHLORIDE 0.9 % IV SOLN
6.0000 mg/kg | Freq: Once | INTRAVENOUS | Status: AC
  Administered 2014-12-16: 315 mg via INTRAVENOUS
  Filled 2014-12-16: qty 15

## 2014-12-16 NOTE — Progress Notes (Signed)
Echocardiogram 2D Echocardiogram has been performed.  Joelene Millin 12/16/2014, 9:45 AM

## 2014-12-16 NOTE — Patient Instructions (Signed)
[facility] Discharge Instructions for Patients Receiving Chemotherapy  Today you received the following chemotherapy agents; Herceptin     BELOW ARE SYMPTOMS THAT SHOULD BE REPORTED IMMEDIATELY:  *FEVER GREATER THAN 100.5 F  *CHILLS WITH OR WITHOUT FEVER  NAUSEA AND VOMITING THAT IS NOT CONTROLLED WITH YOUR NAUSEA MEDICATION  *UNUSUAL SHORTNESS OF BREATH  *UNUSUAL BRUISING OR BLEEDING  TENDERNESS IN MOUTH AND THROAT WITH OR WITHOUT PRESENCE OF ULCERS  *URINARY PROBLEMS  *BOWEL PROBLEMS  UNUSUAL RASH Items with * indicate a potential emergency and should be followed up as soon as possible.  Feel free to call the clinic you have any questions or concerns. The clinic phone number is [phone].  Please show the CHEMO ALERT CARD at check-in to the Emergency Department and triage nurse.   

## 2014-12-16 NOTE — Progress Notes (Signed)
Flu injection given by infusion nurse

## 2014-12-26 ENCOUNTER — Telehealth: Payer: Self-pay | Admitting: Oncology

## 2014-12-26 NOTE — Telephone Encounter (Signed)
Lt mess for pcp to obtain autho for 12/23 appt

## 2014-12-27 ENCOUNTER — Ambulatory Visit (HOSPITAL_BASED_OUTPATIENT_CLINIC_OR_DEPARTMENT_OTHER): Payer: 59 | Admitting: Oncology

## 2014-12-27 ENCOUNTER — Other Ambulatory Visit (HOSPITAL_BASED_OUTPATIENT_CLINIC_OR_DEPARTMENT_OTHER): Payer: 59

## 2014-12-27 VITALS — BP 108/56 | HR 86 | Temp 98.4°F | Resp 20 | Ht 60.0 in | Wt 119.2 lb

## 2014-12-27 DIAGNOSIS — C50412 Malignant neoplasm of upper-outer quadrant of left female breast: Secondary | ICD-10-CM

## 2014-12-27 DIAGNOSIS — Z170 Estrogen receptor positive status [ER+]: Secondary | ICD-10-CM

## 2014-12-27 DIAGNOSIS — C50912 Malignant neoplasm of unspecified site of left female breast: Secondary | ICD-10-CM | POA: Diagnosis not present

## 2014-12-27 DIAGNOSIS — Z79811 Long term (current) use of aromatase inhibitors: Secondary | ICD-10-CM | POA: Diagnosis not present

## 2014-12-27 DIAGNOSIS — Z720 Tobacco use: Secondary | ICD-10-CM

## 2014-12-27 LAB — COMPREHENSIVE METABOLIC PANEL
ALBUMIN: 4.1 g/dL (ref 3.5–5.0)
ALK PHOS: 53 U/L (ref 40–150)
ALT: 20 U/L (ref 0–55)
AST: 24 U/L (ref 5–34)
Anion Gap: 11 mEq/L (ref 3–11)
BUN: 18.3 mg/dL (ref 7.0–26.0)
CALCIUM: 9.9 mg/dL (ref 8.4–10.4)
CHLORIDE: 100 meq/L (ref 98–109)
CO2: 29 mEq/L (ref 22–29)
Creatinine: 0.9 mg/dL (ref 0.6–1.1)
EGFR: 73 mL/min/{1.73_m2} — AB (ref >90)
Glucose: 67 mg/dl — ABNORMAL LOW (ref 70–140)
POTASSIUM: 4.2 meq/L (ref 3.5–5.1)
SODIUM: 141 meq/L (ref 136–145)
Total Bilirubin: 0.37 mg/dL (ref 0.20–1.20)
Total Protein: 6.9 g/dL (ref 6.4–8.3)

## 2014-12-27 LAB — CBC WITH DIFFERENTIAL/PLATELET
BASO%: 1 % (ref 0.0–2.0)
BASOS ABS: 0 10*3/uL (ref 0.0–0.1)
EOS ABS: 0.1 10*3/uL (ref 0.0–0.5)
EOS%: 1.8 % (ref 0.0–7.0)
HEMATOCRIT: 42.7 % (ref 34.8–46.6)
HGB: 14.3 g/dL (ref 11.6–15.9)
LYMPH%: 23.1 % (ref 14.0–49.7)
MCH: 34.4 pg — AB (ref 25.1–34.0)
MCHC: 33.4 g/dL (ref 31.5–36.0)
MCV: 102.8 fL — AB (ref 79.5–101.0)
MONO#: 0.5 10*3/uL (ref 0.1–0.9)
MONO%: 9.6 % (ref 0.0–14.0)
NEUT#: 3.3 10*3/uL (ref 1.5–6.5)
NEUT%: 64.5 % (ref 38.4–76.8)
Platelets: 198 10*3/uL (ref 145–400)
RBC: 4.16 10*6/uL (ref 3.70–5.45)
RDW: 14.4 % (ref 11.2–14.5)
WBC: 5.1 10*3/uL (ref 3.9–10.3)
lymph#: 1.2 10*3/uL (ref 0.9–3.3)

## 2014-12-27 MED ORDER — ANASTROZOLE 1 MG PO TABS: 1.0000 mg | ORAL_TABLET | Freq: Every day | ORAL | Status: DC

## 2014-12-27 NOTE — Addendum Note (Signed)
Addended by: Chauncey Cruel on: 12/27/2014 10:27 AM   Modules accepted: Orders

## 2014-12-27 NOTE — Progress Notes (Signed)
[facility]  Telephone:([phone]) [phone] Fax:(336) [phone]     ID: Tanya [name] DOB: May 31, 1950  MR#: [id number]  HOZ#:[id number]  Patient Care Team: Rowe Clack, MD as PCP - General [name], MD (Gastroenterology) [name], MD (Obstetrics and Gynecology) Chauncey Cruel, MD as Consulting Physician (Oncology) [name], MD as Referring Physician (Surgery) PCP: Gwendolyn Grant, MD OTHER MD: [name] MD, [name] MD  CHIEF COMPLAINT: Estrogen receptor positive breast cancer  CURRENT TREATMENT: Anastrozole   BREAST CANCER HISTORY: From the original intake note:  At the age of 64, the patient underwent lumpectomy and sentinel lymph node biopsy for a left sided T1(mic) N0, stage IA invasive breast cancer. She completed whole breast irradiation and received tamoxifen for 5 years. She has continued on raloxifene.  More recently, screening mammography at the breast Center with tomography 06/19/2014 showed calcifications in the left breast, which were further evaluated with diagnostic left mammography 06/26/2014. Breast density was category C. There was a 5 mm group of heterogeneous calcifications in the outer left breast. This was biopsied 07/04/2014, and showed ([id number]) invasive and in situ ductal carcinoma, grade 2. There was a minimal amount of invasive tumor, but it did allow for prognostic panel, which showed the cancer to be estrogen receptor 90% positive, progesterone receptor 80% positive, both with strong staining intensity, with an MIB-1 of 80%, and HER-2 amplified, with a signals ratio of 8.09, the number per cell being 8.90.  The patient then was evaluated at [facility] by Dr. Elisha Headland and, given the prior history of radiation, mastectomy with repeat sentinel lymph node sampling was suggested. This was performed 08/08/2014, and showed ([id number]) invasive ductal carcinoma, grade 1, measuring 0.3 cm. Margins were  negative. All 3 sentinel lymph nodes sampled were negative. Repeat prognostic panel was obtained on the DCIS only, and found the tumor to be estrogen receptor positive at 65% and progesterone receptor positive at 75%.  The patient's subsequent history is as detailed below.   INTERVAL HISTORY: Tanya [name] returns today for follow-up of her triple positive breast cancer. She completed her Taxol treatments late November. After the 11th dose she had grade 1 neuropathy. 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 This does not affect her activities and does not cause her pain. It is just oat in nuisance". As far as the Herceptin is concerned she is tolerating it with no side effects but she is very concerned about financial toxicity because of insurance changes coming up next year.  She is now ready to discuss anti-estrogens  REVIEW OF SYSTEMS: Tanya [name] has not been able to exercise regularly recently because her husband is back home after having had hip replacement and been at a nursing home. She is basically caregiving 24 7. She does have problems with arthritis, which are not more intense or persistent than before. A detailed review of systems today was otherwise noncontributory  PAST MEDICAL HISTORY: Past Medical History  Diagnosis Date  . COPD, mild ([place])   . Smoker   . Osteopenia     DEXA 10/27/11: -1.4, no change from 10/2009  . Shingles   . Breast cancer ([place])     Radiation and lumpectomy  . Family history of breast cancer   . Family history of uterine cancer     PAST SURGICAL HISTORY: Past Surgical History  Procedure Laterality Date  . Tonsillectomy and adenoidectomy    .  Dilation and evacuation    . Breast lumpectomy    . Breast biopsy      FAMILY HISTORY Family History  Problem Relation Age of Onset  . CAD Mother     stent older age  . Hypertension Mother   . Breast cancer Mother 7  . Cancer Mother     SKIN  and BLADDER   . Heart disease Paternal Grandfather   . Lung cancer Paternal Grandfather   . Hypertension Father   . Heart disease Father 78    CHF   . Dementia Father   . Hearing loss Maternal Grandfather   . Parkinson's disease Paternal Grandmother    the patient's father died with Alzheimer's disease at age 31. The patient's mother was diagnosed with breast cancer at age 30. She died from C. difficile colitis at age 64. The patient has one brother, one sister. There is no history of breast or ovarian cancer in the immediate family.  GYNECOLOGIC HISTORY:  No LMP recorded. Patient is postmenopausal. Menarche age 53, the patient is GX P0. She underwent menopause approximately the year 2000. She did not take hormone replacement. 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 Her husband, [name], used to be in Press photographer (Set designer) but now has had 2 back fusions and is disabled. At home is just the 2 of them plus her dog Max   ADVANCED DIRECTIVES: In place   HEALTH MAINTENANCE: Social History  Substance Use Topics  . Smoking status: Current Every Day Smoker -- 1.00 packs/day for 30 years    Types: Cigarettes  . Smokeless tobacco: Never Used     Comment: Trying to smoke less.   . Alcohol Use: 8.4 oz/week    14 Standard drinks or equivalent per week     Comment: 5 DRINKS A WEEK     Colonoscopy: November 2009  PAP:  Bone density: November 2015:  Lipid panel:  Allergies  Allergen Reactions  . Codeine     REACTION: Rash  . Tetanus Toxoid     REACTION: Shock    Current Outpatient Prescriptions  Medication Sig Dispense Refill  . buPROPion (WELLBUTRIN XL) 150 MG 24 hr tablet Take 1 tablet (150 mg total) by mouth daily. 90 tablet 3  . Calcium Carbonate-Vitamin D 600-400 MG-UNIT tablet Take 1 tablet by mouth 2 (two) times daily.    . Cholecalciferol  (VITAMIN D-1000 MAX ST) 1000 UNITS tablet Take 1,000 Units by mouth.    Marland Kitchen ibuprofen (MOTRIN IB) 200 MG tablet Take 1 tablet (200 mg total) by mouth every 6 (six) hours as needed. 30 tablet 0  . LORazepam (ATIVAN) 0.5 MG tablet Take 1 tablet (0.5 mg total) by mouth every 8 (eight) hours. (Patient not taking: Reported on 12/02/2014) 30 tablet 0  . Misc Natural Products (OSTEO BI-FLEX TRIPLE STRENGTH) TABS Take 1 tablet by mouth 2 (two) times daily.    . ondansetron (ZOFRAN) 8 MG tablet Take 1 tablet (8 mg total) by mouth 2 (two) times daily as needed for nausea or vomiting. (Patient not taking: Reported on 10/21/2014) 20 tablet 1  . prochlorperazine (COMPAZINE) 10 MG tablet Take 1 tablet (10 mg total) by mouth every 6 (six) hours as needed for nausea or vomiting. (Patient not taking: Reported on 10/28/2014) 30 tablet 1   No current facility-administered medications for this visit.    OBJECTIVE:  Middle-aged white woman who appears well Filed Vitals:   12/27/14 0829  BP: 108/56  Pulse: 86  Temp: 98.4 F (36.9 C)  Resp: 20     Body mass index is 23.28 kg/(m^2).      ECOG FS:0 - Asymptomatic  Sclerae unicteric, pupils round and equal Oropharynx clear and moist-- no thrush or other lesions No cervical or supraclavicular adenopathy Lungs no rales or rhonchi Heart regular rate and rhythm Abd soft, nontender, positive bowel sounds MSK no focal spinal tenderness, no upper extremity lymphedema Neuro: nonfocal, well oriented, appropriate affect Breasts: The right breast is unremarkable. The left breast is status post mastectomy. There is no evidence of chest wall recurrence. The left axilla is benign.   LAB RESULTS:  INo results found for: SPEP, UPEP  CBC Latest Ref Rng 12/27/2014 12/16/2014 12/02/2014  WBC 3.9 - 10.3 10e3/uL 5.1 6.5 5.0  Hemoglobin 11.6 - 15.9 g/dL 14.3 14.6 13.2  Hematocrit 34.8 - 46.6 % 42.7 43.6 38.8  Platelets 145 - 400 10e3/uL 198 208 201     CMP Latest Ref Rng  12/16/2014 12/02/2014 11/25/2014  Glucose 70 - 140 mg/dl 78 93 96  BUN 7.0 - 26.0 mg/dL 16.3 16.1 15.4  Creatinine 0.6 - 1.1 mg/dL 0.8 0.8 0.7  Sodium 136 - 145 mEq/L 141 141 138  Potassium 3.5 - 5.1 mEq/L 4.0 4.2 4.4  Chloride 96 - 112 mEq/L - - -  CO2 22 - 29 mEq/L 29 30(H) 30(H)  Calcium 8.4 - 10.4 mg/dL 9.8 9.6 9.7  Total Protein 6.4 - 8.3 g/dL 7.3 6.7 6.3(L)  Total Bilirubin 0.20 - 1.20 mg/dL 0.43 0.31 0.34  Alkaline Phos 40 - 150 U/L 58 44 45  AST 5 - 34 U/L _0 ALT 0 - 55 U/L _1 No results found for: LABCA2  No components found for: [id number]  No results for input(s): INR in the last 168 hours.  Urinalysis    Component Value Date/Time   COLORURINE YELLOW 04/03/2014 1059   APPEARANCEUR CLEAR 04/03/2014 1059   LABSPEC <=1.005* 04/03/2014 1059   PHURINE 7.0 04/03/2014 1059   GLUCOSEU NEGATIVE 04/03/2014 1059   GLUCOSEU NEG 06/08/2013 1016   HGBUR TRACE-INTACT* 04/03/2014 1059   BILIRUBINUR NEGATIVE 04/03/2014 1059   KETONESUR NEGATIVE 04/03/2014 1059   PROTEINUR NEG 06/08/2013 1016   UROBILINOGEN 0.2 04/03/2014 1059   NITRITE NEGATIVE 04/03/2014 1059   LEUKOCYTESUR NEGATIVE 04/03/2014 1059    STUDIES: Transthoracic Echocardiography  Patient:  Dhiya, Smits MR #:    [id number] Study Date: 12/16/2014 Gender:   F Age:    64 Height:   165.1 cm Weight:   53.6 kg BSA:    1.56 m^2 Pt. Status: Room:  ATTENDING  [name], Valli Glance   [name], [name] REFERRING  [name], [name] SONOGRAPHER Jimmy Reel, RDCS PERFORMING  [facility], Outpatient  cc:  ------------------------------------------------------------------- LV EF: 55% -  60%  ------------------- ASSESSMENT: 64 y.o. [place] woman  (1) status post left lumpectomy and sentinel lymph node sampling 1999 for a T1(mic) N0, stage IA invasive breast cancer,  (a) status post adjuvant radiation  (b) status post tamoxifen for 5 years  (2) status  post left breast biopsy 07/04/2014 for a clinical T1a N0, stage IA invasive ductal carcinoma, triple positive, with an MIB-1 of 80%--the patient was on raloxifene at the time  (3) status post left mastectomy and sentinel lymph node sampling 08/06/2014 for a pT1a pN0, stage IA  invasive ductal carcinoma, grade 2, with negative margins. There was insufficient tissue to repeat HER-2 testing  (4) adjuvant chemotherapy started 09/16/2014 consisting of paclitaxel weekly 12 with trastuzumab given every 3 weeks, continuing the trastuzumab alone to complete 1 year  (a) completed 12 doses of paclitaxel 12/02/2014.  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  (7) tobacco abuse: The patient has been strongly advised to quit  (a) Wellbutrin started 11/11/2014  PLAN: Tanya [name] completed her chemotherapy with minimal side effects and she is tolerating the Herceptin well. Her big concern however is financial toxicity. She will have a large deductible early next year and then 30% of each cost. She really cannot afford this.  We reviewed her situation as far as the anti-HER-2 treatments are concerned. In general we prefer not to use anti-HER-2 therapy in HER-2 positive T1 1 tumors. In her situation we did use it because her tumor grew right through raloxifene which is an antiestrogen and we were concerned if we did not block the HER-2 "mouth" she would not get any benefit from antiestrogen's.  We do have data in general that 6 months of anti-HER-2 treatment is not as good as 12 months. However we do have the FINN HER study which used only 9 weeks of Herceptin and still had a significant benefit. In short taking it altogether I think the small additional benefit she would  get from an additional 6-8 months of Herceptin would not be worth the many thousands of dollars that she would have to spend out-of-pocket, which she does not have. Accordingly we are stopping Herceptin at this point.  We then discussed anastrozole. This works differently from raloxifene and therefore has a good chance of being effective in her situation. She is aware of the possible toxicities, side effects and complications and particularly the thinning of bones, which mostly occurs in the first year of treatment. We can abrogate that I giving her Zometa one time and I have scheduled her for that next week.  She will start anastrozole next week as well. She will see me again in 2-3 months. If she is tolerating it well the plan will be to continue anastrozole for total of 5 years  She has a good understanding of this plan. She agrees with it. She knows the goal of treatment in her case is cure. She will call with any problems that may develop before her next visit here.   Chauncey Cruel, MD   12/27/2014 9:15 AM

## 2014-12-30 ENCOUNTER — Encounter: Payer: Self-pay | Admitting: Oncology

## 2014-12-30 ENCOUNTER — Encounter: Payer: Self-pay | Admitting: Nurse Practitioner

## 2014-12-31 ENCOUNTER — Telehealth: Payer: Self-pay | Admitting: *Deleted

## 2014-12-31 ENCOUNTER — Other Ambulatory Visit: Payer: Self-pay | Admitting: *Deleted

## 2014-12-31 DIAGNOSIS — M8580 Other specified disorders of bone density and structure, unspecified site: Secondary | ICD-10-CM

## 2014-12-31 NOTE — Telephone Encounter (Signed)
Appt scheduled for tomorrow per desk RN. Desk RN to call the patient.

## 2015-01-01 ENCOUNTER — Ambulatory Visit (HOSPITAL_BASED_OUTPATIENT_CLINIC_OR_DEPARTMENT_OTHER): Payer: 59

## 2015-01-01 VITALS — BP 102/60 | HR 83 | Temp 98.4°F | Resp 20

## 2015-01-01 DIAGNOSIS — M8580 Other specified disorders of bone density and structure, unspecified site: Secondary | ICD-10-CM | POA: Diagnosis not present

## 2015-01-01 DIAGNOSIS — C50412 Malignant neoplasm of upper-outer quadrant of left female breast: Secondary | ICD-10-CM

## 2015-01-01 MED ORDER — SODIUM CHLORIDE 0.9 % IV SOLN
Freq: Once | INTRAVENOUS | Status: AC
  Administered 2015-01-01: 16:00:00 via INTRAVENOUS

## 2015-01-01 MED ORDER — ZOLEDRONIC ACID 4 MG/100ML IV SOLN
4.0000 mg | Freq: Once | INTRAVENOUS | Status: AC
  Administered 2015-01-01: 4 mg via INTRAVENOUS
  Filled 2015-01-01: qty 100

## 2015-01-01 NOTE — Telephone Encounter (Signed)
See other note

## 2015-01-01 NOTE — Patient Instructions (Signed)

## 2015-01-07 ENCOUNTER — Other Ambulatory Visit: Payer: Self-pay | Admitting: *Deleted

## 2015-01-07 DIAGNOSIS — C50412 Malignant neoplasm of upper-outer quadrant of left female breast: Secondary | ICD-10-CM

## 2015-01-08 ENCOUNTER — Telehealth: Payer: Self-pay | Admitting: Oncology

## 2015-01-08 NOTE — Telephone Encounter (Signed)
Added SCP visit for 2/23. Left message for patient and mailed schedule.

## 2015-01-13 ENCOUNTER — Telehealth: Payer: Self-pay | Admitting: Family Medicine

## 2015-01-13 NOTE — Telephone Encounter (Signed)
Pt called and left message with answering service notifying us of Mr. Tanya [name]' passing away.   Please place a courtesy call... # from caller ID is different from numbers listed on patient chart.

## 2015-01-15 NOTE — Telephone Encounter (Signed)
Dr. Carlota Raspberry spoke with his family.

## 2015-01-31 ENCOUNTER — Encounter: Payer: Self-pay | Admitting: Oncology

## 2015-02-05 ENCOUNTER — Telehealth: Payer: Self-pay | Admitting: *Deleted

## 2015-02-05 NOTE — Telephone Encounter (Signed)
"  I have questions about Herceptin. 1. Did I receive enough Herceptin? 2. Will it benefit me to receive more Herceptin?  I only received five, I thought the study was for at least nine cycles. 3. If I need more can someone check approval from insurance and let me know what my out of pocket cost will be.  I have to pay a 100 % until I meet a $3600 deductible.   4. Someone signed my up for Survivorship Visit.  What is this and is there a charge.  I do not need to pay $250 for a visit that will only tell me I need to contact my GYN or PCP."  Will notify provider.  Reports she has also sent a MyChart patient request on 01-31-2015.

## 2015-02-10 NOTE — Telephone Encounter (Signed)
Both messages printed for provider desk basked.

## 2015-02-17 ENCOUNTER — Other Ambulatory Visit: Payer: Self-pay | Admitting: Oncology

## 2015-02-20 ENCOUNTER — Telehealth: Payer: Self-pay

## 2015-02-20 ENCOUNTER — Telehealth: Payer: Self-pay | Admitting: Oncology

## 2015-02-20 NOTE — Telephone Encounter (Signed)
Writer called patient back per Dr. Jana Hakim who stated that "patient received enough herceptin-can discuss more at April visit".  Patient states that she only received 5 infusions of herceptin and continues to question if that is enough?  She will wait to discuss this at her next appt which is in April 2017.

## 2015-02-20 NOTE — Telephone Encounter (Signed)
Patient left vm to cancel survivorship appt on 2/23. done

## 2015-02-27 ENCOUNTER — Encounter: Payer: Self-pay | Admitting: Nurse Practitioner

## 2015-04-07 ENCOUNTER — Telehealth: Payer: Self-pay | Admitting: Oncology

## 2015-04-07 ENCOUNTER — Other Ambulatory Visit (HOSPITAL_BASED_OUTPATIENT_CLINIC_OR_DEPARTMENT_OTHER): Payer: BLUE CROSS/BLUE SHIELD

## 2015-04-07 ENCOUNTER — Ambulatory Visit (HOSPITAL_BASED_OUTPATIENT_CLINIC_OR_DEPARTMENT_OTHER): Payer: BLUE CROSS/BLUE SHIELD | Admitting: Oncology

## 2015-04-07 ENCOUNTER — Ambulatory Visit (HOSPITAL_COMMUNITY)
Admission: RE | Admit: 2015-04-07 | Discharge: 2015-04-07 | Disposition: A | Discharge status: Home / Self Care | Payer: BLUE CROSS/BLUE SHIELD | Source: Ambulatory Visit | Attending: Oncology | Admitting: Oncology

## 2015-04-07 VITALS — BP 106/62 | HR 78 | Temp 98.9°F | Resp 17 | Ht 60.0 in | Wt 115.2 lb

## 2015-04-07 DIAGNOSIS — M25552 Pain in left hip: Secondary | ICD-10-CM

## 2015-04-07 DIAGNOSIS — Z79811 Long term (current) use of aromatase inhibitors: Secondary | ICD-10-CM | POA: Diagnosis not present

## 2015-04-07 DIAGNOSIS — C50912 Malignant neoplasm of unspecified site of left female breast: Secondary | ICD-10-CM

## 2015-04-07 DIAGNOSIS — C50412 Malignant neoplasm of upper-outer quadrant of left female breast: Secondary | ICD-10-CM

## 2015-04-07 DIAGNOSIS — Z720 Tobacco use: Secondary | ICD-10-CM | POA: Diagnosis not present

## 2015-04-07 DIAGNOSIS — M1612 Unilateral primary osteoarthritis, left hip: Secondary | ICD-10-CM | POA: Insufficient documentation

## 2015-04-07 LAB — COMPREHENSIVE METABOLIC PANEL
ALT: 18 U/L (ref 0–55)
AST: 23 U/L (ref 5–34)
Albumin: 4.3 g/dL (ref 3.5–5.0)
Alkaline Phosphatase: 42 U/L (ref 40–150)
Anion Gap: 7 mEq/L (ref 3–11)
BILIRUBIN TOTAL: 0.45 mg/dL (ref 0.20–1.20)
BUN: 15.2 mg/dL (ref 7.0–26.0)
CO2: 34 mEq/L — ABNORMAL HIGH (ref 22–29)
CREATININE: 0.8 mg/dL (ref 0.6–1.1)
Calcium: 10.3 mg/dL (ref 8.4–10.4)
Chloride: 101 mEq/L (ref 98–109)
EGFR: 76 mL/min/{1.73_m2} — ABNORMAL LOW (ref >90)
GLUCOSE: 91 mg/dL (ref 70–140)
Potassium: 4.7 mEq/L (ref 3.5–5.1)
SODIUM: 142 meq/L (ref 136–145)
TOTAL PROTEIN: 7.5 g/dL (ref 6.4–8.3)

## 2015-04-07 LAB — CBC WITH DIFFERENTIAL/PLATELET
BASO%: 0.4 % (ref 0.0–2.0)
Basophils Absolute: 0 10*3/uL (ref 0.0–0.1)
EOS%: 1.1 % (ref 0.0–7.0)
Eosinophils Absolute: 0.1 10*3/uL (ref 0.0–0.5)
HCT: 45.2 % (ref 34.8–46.6)
HEMOGLOBIN: 15.7 g/dL (ref 11.6–15.9)
LYMPH%: 28.2 % (ref 14.0–49.7)
MCH: 33.5 pg (ref 25.1–34.0)
MCHC: 34.7 g/dL (ref 31.5–36.0)
MCV: 96.4 fL (ref 79.5–101.0)
MONO#: 0.4 10*3/uL (ref 0.1–0.9)
MONO%: 8.1 % (ref 0.0–14.0)
NEUT%: 62.2 % (ref 38.4–76.8)
NEUTROS ABS: 3.3 10*3/uL (ref 1.5–6.5)
Platelets: 164 10*3/uL (ref 145–400)
RBC: 4.69 10*6/uL (ref 3.70–5.45)
RDW: 13.5 % (ref 11.2–14.5)
WBC: 5.3 10*3/uL (ref 3.9–10.3)
lymph#: 1.5 10*3/uL (ref 0.9–3.3)

## 2015-04-07 NOTE — Progress Notes (Signed)
[place]  [phone] [phone]     ID: Tanya [name] DOB: Nov 16, 1950  MR#: [id number]  UTM#:[id number]  Patient Care Team: Rowe Clack, MD as PCP - General [name], MD (Gastroenterology) [name], MD (Obstetrics and Gynecology) Chauncey Cruel, MD as Consulting Physician (Oncology) [name], MD as Referring Physician (Surgery) PCP: Gwendolyn Grant, MD OTHER MD: [name] MD, [name] MD  CHIEF COMPLAINT: Estrogen receptor positive breast cancer  CURRENT TREATMENT: Anastrozole   BREAST CANCER HISTORY: From the original intake note:  At the age of 65, the patient underwent lumpectomy and sentinel lymph node biopsy for a left sided T1(mic) N0, stage IA invasive breast cancer. She completed whole breast irradiation and received tamoxifen for 5 years. She has continued on raloxifene.  More recently, screening mammography at the breast Center with tomography 06/19/2014 showed calcifications in the left breast, which were further evaluated with diagnostic left mammography 06/26/2014. Breast density was category C. There was a 5 mm group of heterogeneous calcifications in the outer left breast. This was biopsied 07/04/2014, and showed ([id number]) invasive and in situ ductal carcinoma, grade 2. There was a minimal amount of invasive tumor, but it did allow for prognostic panel, which showed the cancer to be estrogen receptor 90% positive, progesterone receptor 80% positive, both with strong staining intensity, with an MIB-1 of 80%, and HER-2 amplified, with a signals ratio of 8.09, the number per cell being 8.90.  The patient then was evaluated at [facility] by Dr. Elisha Headland and, given the prior history of radiation, mastectomy with repeat sentinel lymph node sampling was suggested. This was performed 08/08/2014, and showed ([id number]) invasive ductal carcinoma, grade 1, measuring 0.3 cm. Margins were  negative. All 3 sentinel lymph nodes sampled were negative. Repeat prognostic panel was obtained on the DCIS only, and found the tumor to be estrogen receptor positive at 65% and progesterone receptor positive at 75%.  The patient's subsequent history is as detailed below.   INTERVAL HISTORY: [name] returns today for follow-up of her very early stage breast cancer. To recap: She finished her radiation treatments in November, and discontinued the trastuzumab in December because of insurance concerns. She then started anastrozole late December 2016. She paid approximately $5 for the first month and is now binding 3 months time which is even cheaper she has had no hot flashes or other significant side effects from this.  REVIEW OF SYSTEMS: Tarryn still feels a little bit tired and some of that might be the anastrozole. She has left hip pain. This is about 63 years old, but perhaps is a little bit worse. She is concerned that there might be something more than arthritis there. Unfortunately she was not able to quit smoking. She does have a Wellbutrin but never did fill it. She has a little bit of numbness in her toes but otherwise no significant neuropathy. Her hair is just beginning to come in. A detailed review of systems today is otherwise stable  PAST MEDICAL HISTORY: Past Medical History  Diagnosis Date  . COPD, mild ([place])   . Smoker   . Osteopenia     DEXA 10/27/11: -1.4, no change from 10/2009  . Shingles   . Breast cancer ([place])     Radiation and lumpectomy  . Family history of breast cancer   . Family history of uterine cancer     PAST SURGICAL HISTORY: Past Surgical History  Procedure Laterality Date  .  Tonsillectomy and adenoidectomy    . Dilation and evacuation    . Breast lumpectomy    . Breast biopsy      FAMILY HISTORY Family History  Problem Relation Age of Onset  . CAD Mother     stent older age  . Hypertension Mother   . Breast cancer Mother 18  . Cancer Mother      SKIN and BLADDER   . Heart disease Paternal Grandfather   . Lung cancer Paternal Grandfather   . Hypertension Father   . Heart disease Father 55    CHF   . Dementia Father   . Hearing loss Maternal Grandfather   . Parkinson's disease Paternal Grandmother    the patient's father died with Alzheimer's disease at age 18. The patient's mother was diagnosed with breast cancer at age 36. She died from C. difficile colitis at age 35. The patient has one brother, one sister. There is no history of breast or ovarian cancer in the immediate family.  GYNECOLOGIC HISTORY:  No LMP recorded. Patient is postmenopausal. Menarche age 33, the patient is GX P0. She underwent menopause approximately the year 2000. She did not take hormone replacement. 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 Her husband, [name], used to be in Press photographer (Set designer) but now has had 2 back fusions and is disabled. At home is just the 2 of them plus her dog Max   ADVANCED DIRECTIVES: In place   HEALTH MAINTENANCE: Social History  Substance Use Topics  . Smoking status: Current Every Day Smoker -- 1.00 packs/day for 30 years    Types: Cigarettes  . Smokeless tobacco: Never Used     Comment: Trying to smoke less.   . Alcohol Use: 8.4 oz/week    14 Standard drinks or equivalent per week     Comment: 5 DRINKS A WEEK     Colonoscopy: November 2009  PAP:  Bone density: November 2015:  Lipid panel:  Allergies  Allergen Reactions  . Codeine     REACTION: Rash  . Tetanus Toxoid     REACTION: Shock    Current Outpatient Prescriptions  Medication Sig Dispense Refill  . anastrozole (ARIMIDEX) 1 MG tablet Take 1 tablet (1 mg total) by mouth daily. 90 tablet 4  . buPROPion (WELLBUTRIN XL) 150 MG 24 hr tablet Take 1 tablet (150 mg total) by mouth daily. 90 tablet 3  . Calcium  Carbonate-Vitamin D 600-400 MG-UNIT tablet Take 1 tablet by mouth 2 (two) times daily.    . Cholecalciferol (VITAMIN D-1000 MAX ST) 1000 UNITS tablet Take 1,000 Units by mouth.    Marland Kitchen ibuprofen (MOTRIN IB) 200 MG tablet Take 1 tablet (200 mg total) by mouth every 6 (six) hours as needed. 30 tablet 0  . LORazepam (ATIVAN) 0.5 MG tablet Take 1 tablet (0.5 mg total) by mouth every 8 (eight) hours. (Patient not taking: Reported on 12/02/2014) 30 tablet 0  . Misc Natural Products (OSTEO BI-FLEX TRIPLE STRENGTH) TABS Take 1 tablet by mouth 2 (two) times daily.    . ondansetron (ZOFRAN) 8 MG tablet Take 1 tablet (8 mg total) by mouth 2 (two) times daily as needed for nausea or vomiting. (Patient not taking: Reported on 10/21/2014) 20 tablet 1  . prochlorperazine (COMPAZINE) 10 MG tablet Take 1 tablet (10 mg total) by mouth every 6 (six) hours as needed  for nausea or vomiting. (Patient not taking: Reported on 10/28/2014) 30 tablet 1   No current facility-administered medications for this visit.    OBJECTIVE: Middle-aged white woman In no acute distress Filed Vitals:   04/07/15 1329  BP: 106/62  Pulse: 78  Temp: 98.9 F (37.2 C)  Resp: 17     Body mass index is 22.5 kg/(m^2).      ECOG FS:0 - Asymptomatic  Sclerae unicteric, EOMs intact Oropharynx clear, dentition in good repair No cervical or supraclavicular adenopathy Lungs no rales or rhonchi Heart regular rate and rhythm Abd soft, nontender, positive bowel sounds MSK no focal spinal tenderness, no upper extremity lymphedema Neuro: nonfocal, well oriented, appropriate affect Breasts: The right breast is unremarkable. The areola is a little bit firmer than the surrounding tissue so there is a little bit of a "shelf" feeling better. There is no suspicious mass. The right axilla is benign. The left side is status post mastectomy. There is no evidence of local recurrence Skin: There is a minimal erythematous rash in the far lateral left axilla.  There is an area of dry skin in the right buttock that she wanted me to look at. There is no erythema or mass associated with this.   LAB RESULTS:  INo results found for: SPEP, UPEP  CBC Latest Ref Rng 04/07/2015 12/27/2014 12/16/2014  WBC 3.9 - 10.3 10e3/uL 5.3 5.1 6.5  Hemoglobin 11.6 - 15.9 g/dL 15.7 14.3 14.6  Hematocrit 34.8 - 46.6 % 45.2 42.7 43.6  Platelets 145 - 400 10e3/uL 164 198 208     CMP Latest Ref Rng 04/07/2015 12/27/2014 12/16/2014  Glucose 70 - 140 mg/dl 91 67(L) 78  BUN 7.0 - 26.0 mg/dL 15.2 18.3 16.3  Creatinine 0.6 - 1.1 mg/dL 0.8 0.9 0.8  Sodium 136 - 145 mEq/L 142 141 141  Potassium 3.5 - 5.1 mEq/L 4.7 4.2 4.0  CO2 22 - 29 mEq/L 34(H) 29 29  Calcium 8.4 - 10.4 mg/dL 10.3 9.9 9.8  Total Protein 6.4 - 8.3 g/dL 7.5 6.9 7.3  Total Bilirubin 0.20 - 1.20 mg/dL 0.45 0.37 0.43  Alkaline Phos 40 - 150 U/L 42 53 58  AST 5 - 34 U/L '23 24 20  ' ALT 0 - 55 U/L '18 20 18     ' No results found for: LABCA2  No components found for: LABCA125  No results for input(s): INR in the last 168 hours.  Urinalysis    Component Value Date/Time   COLORURINE YELLOW 04/03/2014 1059   APPEARANCEUR CLEAR 04/03/2014 1059   LABSPEC <=1.005* 04/03/2014 1059   PHURINE 7.0 04/03/2014 1059   GLUCOSEU NEGATIVE 04/03/2014 1059   GLUCOSEU NEG 06/08/2013 1016   HGBUR TRACE-INTACT* 04/03/2014 1059   BILIRUBINUR NEGATIVE 04/03/2014 1059   KETONESUR NEGATIVE 04/03/2014 1059   PROTEINUR NEG 06/08/2013 1016   UROBILINOGEN 0.2 04/03/2014 1059   NITRITE NEGATIVE 04/03/2014 1059   LEUKOCYTESUR NEGATIVE 04/03/2014 1059    STUDIES:------------------- at [facility] 02/21/2015 No mammographic evidence of malignancy.  Breast composition: Heterogeneously dense.  BI-RADS Category 1- Negative  RECOMMENDATIONS: Follow-up in 1 year.  [place] law now requires that mammography facilities inform their patients of their breast density and that higher density may increase their risk of cancer and may  decrease sensitivity of mammography. Also, if heterogeneously dense or extremely dense, they should talk with their doctor about alternative screening strategies. The following website has been developed to assist patients and physicians: [email or web address].org/[email or web address]-health.[email or web address]. The patient lay summary letter includes their breast  density, a link to the density website and for patients with dense breasts, a recommendation to talk with their doctor.   Result Narrative  MAMMOGRAM RIGHT BREAST, 02/21/2015 11:10 AM  INDICATION:  BREAST CANCERC50.112 Malignant neoplasm of central portion of left female breast ([place])   COMPARISON: Multiple priors.  TECHNIQUE: CC and MLO views were performed of the right breast with digital technique and computer-aided detection. The examination was supplemented with 3D tomosynthesis.  FINDINGS:   No suspicious mass or calcifications are seen in the right breast.       Status    ASSESSMENT: 65 y.o. [place] woman  (1) status post left lumpectomy and sentinel lymph node sampling 1999 for a T1(mic) N0, stage IA invasive breast cancer,  (a) status post adjuvant radiation  (b) status post tamoxifen for 5 years  (2) status post left breast biopsy 07/04/2014 for a clinical T1a N0, stage IA invasive ductal carcinoma, triple positive, with an MIB-1 of 80%--the patient was on raloxifene at the time  (3) status post left mastectomy and sentinel lymph node sampling 08/06/2014 for a pT1a pN0, stage IA invasive ductal carcinoma, grade 2, with negative margins. There was insufficient tissue to repeat HER-2 testing  (4) adjuvant chemotherapy started 09/16/2014 consisting of paclitaxel weekly 12 with trastuzumab given every 3 weeks, continuing the trastuzumab alone to complete 1 year, but stopped 12/16/2014 for financial reasons  (a) completed 12 doses of paclitaxel 12/02/2014.  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  (7) tobacco abuse: The patient has been strongly advised to quit  (a) Wellbutrin prescribed 11/11/2014, notyet started by patient will  PLAN: Marilu is tolerating the anastrozole with minimal if any side effects. She feels a little bit tired him a but I suspect that is more related to her prior surgery and radiation.  The left hip pain is not new. It is perhaps a little bit worse. It is worth looking at swine setting her up for plain film of her left hip today. She knows that he is "my chart" so she will look of the results this evening.  Her mammogram in February was very favorable. She understands that we do not have data from these any more frequently than yearly in the absence of specific findings to evaluate.  We again discussed smoking cessation. She has a Wellbutrin in hand, but is hesitant to start it. I encouraged her to give it a try. She is aware of our smoking cessation program.  Otherwise she is very reluctant to continue to travel to [place] because of the stress of driving. She knows to call for any problems that may develop before the next visit here. She has a very good overall prognosis I do not believe she needs to be seen all that frequently. Accordingly I am scheduling her to return to see me again in 6 months.  She knows to call for any problems that may develop before then.   Chauncey Cruel, MD   04/07/2015 1:33 PM

## 2015-04-07 NOTE — Telephone Encounter (Signed)
Gave adn printed appt shced and avs for pt for OCT

## 2015-04-10 ENCOUNTER — Encounter: Payer: Self-pay | Admitting: Oncology

## 2015-04-15 ENCOUNTER — Encounter: Payer: Self-pay | Admitting: Family Medicine

## 2015-04-15 ENCOUNTER — Ambulatory Visit (INDEPENDENT_AMBULATORY_CARE_PROVIDER_SITE_OTHER): Payer: BLUE CROSS/BLUE SHIELD | Admitting: Family Medicine

## 2015-04-15 VITALS — BP 110/70 | HR 98 | Temp 98.7°F | Ht 64.57 in | Wt 116.3 lb

## 2015-04-15 DIAGNOSIS — F17200 Nicotine dependence, unspecified, uncomplicated: Secondary | ICD-10-CM | POA: Diagnosis not present

## 2015-04-15 DIAGNOSIS — E7800 Pure hypercholesterolemia, unspecified: Secondary | ICD-10-CM

## 2015-04-15 DIAGNOSIS — M159 Polyosteoarthritis, unspecified: Secondary | ICD-10-CM | POA: Diagnosis not present

## 2015-04-15 DIAGNOSIS — J449 Chronic obstructive pulmonary disease, unspecified: Secondary | ICD-10-CM

## 2015-04-15 DIAGNOSIS — M8580 Other specified disorders of bone density and structure, unspecified site: Secondary | ICD-10-CM

## 2015-04-15 DIAGNOSIS — Z129 Encounter for screening for malignant neoplasm, site unspecified: Secondary | ICD-10-CM

## 2015-04-15 NOTE — Addendum Note (Signed)
Addended by: [name], Beyonce Sawatzky G on: 04/15/2015 12:28 PM   Modules accepted: Orders

## 2015-04-15 NOTE — Patient Instructions (Signed)
Smoking Cessation, Tips for Success If you are ready to quit smoking, congratulations! You have chosen to help yourself be healthier. Cigarettes bring nicotine, tar, carbon monoxide, and other irritants into your body. Your lungs, heart, and blood vessels will be able to work better without these poisons. There are many different ways to quit smoking. Nicotine gum, nicotine patches, a nicotine inhaler, or nicotine nasal spray can help with physical craving. Hypnosis, support groups, and medicines help break the habit of smoking. WHAT THINGS CAN I DO TO MAKE QUITTING EASIER?  Here are some tips to help you quit for good:  Pick a date when you will quit smoking completely. Tell all of your friends and family about your plan to quit on that date.  Do not try to slowly cut down on the number of cigarettes you are smoking. Pick a quit date and quit smoking completely starting on that day.  Throw away all cigarettes.   Clean and remove all ashtrays from your home, work, and car.  On a card, write down your reasons for quitting. Carry the card with you and read it when you get the urge to smoke.  Cleanse your body of nicotine. Drink enough water and fluids to keep your urine clear or pale yellow. Do this after quitting to flush the nicotine from your body.  Learn to predict your moods. Do not let a bad situation be your excuse to have a cigarette. Some situations in your life might tempt you into wanting a cigarette.  Never have "just one" cigarette. It leads to wanting another and another. Remind yourself of your decision to quit.  Change habits associated with smoking. If you smoked while driving or when feeling stressed, try other activities to replace smoking. Stand up when drinking your coffee. Brush your teeth after eating. Sit in a different chair when you read the paper. Avoid alcohol while trying to quit, and try to drink fewer caffeinated beverages. Alcohol and caffeine may urge you to  smoke.  Avoid foods and drinks that can trigger a desire to smoke, such as sugary or spicy foods and alcohol.  Ask people who smoke not to smoke around you.  Have something planned to do right after eating or having a cup of coffee. For example, plan to take a walk or exercise.  Try a relaxation exercise to calm you down and decrease your stress. Remember, you may be tense and nervous for the first 2 weeks after you quit, but this will pass.  Find new activities to keep your hands busy. Play with a pen, coin, or rubber band. Doodle or draw things on paper.  Brush your teeth right after eating. This will help cut down on the craving for the taste of tobacco after meals. You can also try mouthwash.   Use oral substitutes in place of cigarettes. Try using lemon drops, carrots, cinnamon sticks, or chewing gum. Keep them handy so they are available when you have the urge to smoke.  When you have the urge to smoke, try deep breathing.  Designate your home as a nonsmoking area.  If you are a heavy smoker, ask your health care provider about a prescription for nicotine chewing gum. It can ease your withdrawal from nicotine.  Reward yourself. Set aside the cigarette money you save and buy yourself something nice.  Look for support from others. Join a support group or smoking cessation program. Ask someone at home or at work to help you with your plan   to quit smoking.  Always ask yourself, "Do I need this cigarette or is this just a reflex?" Tell yourself, "Today, I choose not to smoke," or "I do not want to smoke." You are reminding yourself of your decision to quit.  Do not replace cigarette smoking with electronic cigarettes (commonly called e-cigarettes). The safety of e-cigarettes is unknown, and some may contain harmful chemicals.  If you relapse, do not give up! Plan ahead and think about what you will do the next time you get the urge to smoke. HOW WILL I FEEL WHEN I QUIT SMOKING? You  may have symptoms of withdrawal because your body is used to nicotine (the addictive substance in cigarettes). You may crave cigarettes, be irritable, feel very hungry, cough often, get headaches, or have difficulty concentrating. The withdrawal symptoms are only temporary. They are strongest when you first quit but will go away within 10-14 days. When withdrawal symptoms occur, stay in control. Think about your reasons for quitting. Remind yourself that these are signs that your body is healing and getting used to being without cigarettes. Remember that withdrawal symptoms are easier to treat than the major diseases that smoking can cause.  Even after the withdrawal is over, expect periodic urges to smoke. However, these cravings are generally short lived and will go away whether you smoke or not. Do not smoke! WHAT RESOURCES ARE AVAILABLE TO HELP ME QUIT SMOKING? Your health care provider can direct you to community resources or hospitals for support, which may include:  Group support.  Education.  Hypnosis.  Therapy.   This information is not intended to replace advice given to you by your health care provider. Make sure you discuss any questions you have with your health care provider.   Document Released: 09/19/2003 Document Revised: 01/11/2014 Document Reviewed: 06/08/2012 Elsevier Interactive Patient Education 2016 [employer]  

## 2015-04-15 NOTE — Progress Notes (Addendum)
HPI:  Tanya [name] is here to establish care with me. Last physical in 03/2014.  She follows with her gyn annually, next appt 06/2015. Her gyn usually orders her mammograms and DEXA. She is s/p breast cancer, s/p left mastectomy , still on Arimidex. She completed chemo in 11/2014, developed neuropathy, which is getitng better (numbness on big toes and fingers). She follows annually with surgeon.   She has history of COPD,osteopenia, arthralgias, and HLD. She is not taking any medication for COPD, denies any cough, dyspnea, or wheezing. Still smoking, has done so since she was 65 years old, in average 1 PPD. She received a Rx for Wellbutrin for smoking cessation but has not started it because not sure if she is ready to quit.  She would like to follow once annually and if possible to do what she needs for health care maintenance today. Colonoscopy in 2009, according to pt, negative and 10 years f/u recommended.   Has the following chronic problems that require follow up and concerns today:  Hip pain: 2 years hx of bilateral intermittent hip pain, L>R, stiffness. Also left knee pain, which seems to be better with Chondroitin. Occasionally she takes Ibuprofen for pain, mainly at night. Pain is exacerbated by certain activities, sometimes pain wakes her up at nigh if she moves left hip certain way, can be severe but most of the time mild. Stable otherwise. No Hx of trauma. She had left hip X ray recently: degenerative changes.  HLD: She has not been consistent with healthy diet. She walks her dog daily. + Smoker.  Lab Results  Component Value Date   CHOL 221* 04/03/2014   HDL 88.90 04/03/2014   LDLCALC 113* 04/03/2014   LDLDIRECT 127.4 03/13/2012   TRIG 96.0 04/03/2014   CHOLHDL 2 04/03/2014     REVIEW OF SYSTEMS:  General: Negative for fever, fatigue, abnormal weight loss, changes in appetite.  Eyes: Negative for conjunctival erythema, visual changes.  Ears:  Negative for earache, hearing loss, or ear drainage.  Nose/sinuses: Negative for rhinorrhea, nasal congestion, sinus pain, epistaxis.  Mouth: Negative for oral lesions, odynophagia, dysphagia.  Neck: negative for swollen glands or masses.  Cardiac: Negative for chest pain, exertional dyspnea, irregular HR, claudication, cold extremities, or edema.  Respiratory: Negative for cough, dyspnea, wheezing, or hemoptysis.  Abdomen: Negative for abdominal pain, changes in bowel habits, blood in stool or melena, nausea, or vomiting.  GU: Negative for dysuria, urinary frequency, urinary ungency, gross hematuria, urine incontinence.  MS: positive for arthralgias and joint stiffness. No joint erythema or edema,or major limitation of ROM.  Neurologic: No confusion,  focal weakness, frequent/severe headaches, or tremor. + Numbness.  Psychiatric: Denies depression, anxiety, or sleep disorder.  Skin: Negative for rash, ulcers, or skin color changes.   Past Medical History  Diagnosis Date  . COPD, mild ([place])   . Smoker   . Osteopenia     DEXA 10/27/11: -1.4, no change from 10/2009  . Shingles   . Breast cancer ([place])     Radiation and lumpectomy  . Family history of breast cancer   . Family history of uterine cancer     Past Surgical History  Procedure Laterality Date  . Tonsillectomy and adenoidectomy    . Dilation and evacuation    . Breast lumpectomy    . Breast biopsy      Family History  Problem Relation Age of Onset  . CAD Mother     stent older age  .  Hypertension Mother   . Breast cancer Mother 3  . Cancer Mother     SKIN and BLADDER   . Heart disease Paternal Grandfather   . Lung cancer Paternal Grandfather   . Hypertension Father   . Heart disease Father 31    CHF   . Dementia Father   . Hearing loss Maternal Grandfather   . Parkinson's disease Paternal Grandmother     Social History   Social History  . Marital Status: Married    Spouse Name: N/A  . Number  of Children: 0  . Years of Education: N/A   Social History Main Topics  . Smoking status: Current Every Day Smoker -- 1.00 packs/day for 30 years    Types: Cigarettes  . Smokeless tobacco: Never Used     Comment: Trying to smoke less.   . Alcohol Use: 8.4 oz/week    14 Standard drinks or equivalent per week     Comment: 5 DRINKS A WEEK  . Drug Use: No  . Sexual Activity: No   Other Topics Concern  . None   Social History Narrative   Lives at home with husband who is disabled.       Current outpatient prescriptions:  .  anastrozole (ARIMIDEX) 1 MG tablet, Take 1 tablet (1 mg total) by mouth daily., Disp: 90 tablet, Rfl: 4 .  Calcium Carbonate-Vitamin D 600-400 MG-UNIT tablet, Take 1 tablet by mouth 2 (two) times daily., Disp: , Rfl:  .  Cholecalciferol (VITAMIN D-1000 MAX ST) 1000 UNITS tablet, Take 1,000 Units by mouth., Disp: , Rfl:  .  ibuprofen (MOTRIN IB) 200 MG tablet, Take 1 tablet (200 mg total) by mouth every 6 (six) hours as needed., Disp: 30 tablet, Rfl: 0 .  Misc Natural Products (OSTEO BI-FLEX TRIPLE STRENGTH) TABS, Take 1 tablet by mouth 2 (two) times daily., Disp: , Rfl:  .  Multiple Vitamin (MULTIVITAMIN) tablet, Take 1 tablet by mouth daily., Disp: , Rfl:   EXAM:  Filed Vitals:   04/15/15 1007  BP: 110/70  Pulse: 98  Temp: 98.7 F (37.1 C)    Body mass index is 19.61 kg/(m^2).  GENERAL: vitals reviewed and listed above, Well developed, appears well hydrated and in no acute distress.  HEENT: atraumatic, conjunttiva clear, oral mucosa moist, no oral lesions appreciated.  NECK: no obvious masses on inspection.No cervical  lymphadenopathy appreciated.  LUNGS: clear to auscultation bilaterally, no wheezes, rales or rhonchi, good air movement  CV: Regular rate and rhythm, no murmurs appreciated, no peripheral edema. DP pulses present bilateral.  ABDOMEN: Soft, no tender, no masses appreciated.  MS: moves all extremities without noticeable abnormality.  Mild knee crepitus bilateral,no effusion or [pain elicited. Left hip mild pain with full flexion, mold limitation on rotation L>R but otherwise adequate ROM.  NEURO: Alert and oriented x 3, no focal deficit appreciated, normal gait.  PSYCH: pleasant and cooperative, no obvious depression or anxiety  ASSESSMENT AND PLAN:  Discussed the following assessment and plan:  Osteoarthritis of multiple joints, unspecified osteoarthritis type Some adverse effects of chronic use of NSAID's. Acetaminophen may help some. Topical OTC medications. Armidex can also aggravate joint pain. OTC SAM-e supplementation may help.  Chronic obstructive pulmonary disease, unspecified COPD type ([place]) Asymptomatic. Strongly recommended quitting smoking,adverse effects discussed.  TOBACCO ABUSE: The patient was counseled on the dangers of tobacco use, and was advised to quit and reluctant to quit.  Reviewed strategies to maximize success, including pharmacotherapy (Wellbutrim and E cigarret). Lung cancer  screening discussed, not sure if she meets criteria, she does not want imaging order if insurance is not going to cover.  She has 40 pack- years hx at least, she is very interested in lung cancer screening, order placed.  -CT chest lung cancer screen low dose.  Hypercholesteremia - Plan: Lipid panel Future lab placed, she is not fasting today. Low fat diet and regular physical activity. I wil make recommendations in regard to stain based on her ASCVD 10 years risk.  Osteopenia DEXA 11/2013 osteopenia. Fall precautions discussed. Continue Ca++ with Vit D.  Her gyn is ordering DEXA. Current treatment with Arimidex can also aggravate problem.   Lab Results  Component Value Date   CHOL 209* 04/16/2015   HDL 76.70 04/16/2015   LDLCALC 113* 04/16/2015   LDLDIRECT 127.4 03/13/2012   TRIG 98.0 04/16/2015   CHOLHDL 3 04/16/2015   10 years ASCVD risk 5.7%.   -We reviewed the PMH, PSH, FH, SH, Meds and  Allergies. -We provided refills for any medications we will prescribe as needed. -We addressed current concerns per orders and patient instructions. -She does not want another appt for health maintenence, so she can follow in a year.    -Patient advised to return or notify a doctor immediately if symptoms worsen or persist or new concerns arise.      Saanvika Vazques G. [name], MD  [facility]. [place] office.

## 2015-04-15 NOTE — Progress Notes (Signed)
Pre visit review using our clinic review tool, if applicable. No additional management support is needed unless otherwise documented below in the visit note. 

## 2015-04-16 ENCOUNTER — Encounter: Payer: Self-pay | Admitting: Family Medicine

## 2015-04-16 ENCOUNTER — Other Ambulatory Visit (INDEPENDENT_AMBULATORY_CARE_PROVIDER_SITE_OTHER): Payer: BLUE CROSS/BLUE SHIELD

## 2015-04-16 DIAGNOSIS — E7800 Pure hypercholesterolemia, unspecified: Secondary | ICD-10-CM

## 2015-04-16 LAB — LIPID PANEL
Cholesterol: 209 mg/dL — ABNORMAL HIGH (ref 0–200)
HDL: 76.7 mg/dL (ref >39.00)
LDL CALC: 113 mg/dL — AB (ref 0–99)
NONHDL: 132.74
Total CHOL/HDL Ratio: 3
Triglycerides: 98 mg/dL (ref 0.0–149.0)
VLDL: 19.6 mg/dL (ref 0.0–40.0)

## 2015-05-01 ENCOUNTER — Other Ambulatory Visit: Payer: Self-pay | Admitting: Family Medicine

## 2015-05-01 ENCOUNTER — Telehealth: Payer: Self-pay | Admitting: Family Medicine

## 2015-05-01 DIAGNOSIS — F17200 Nicotine dependence, unspecified, uncomplicated: Secondary | ICD-10-CM

## 2015-05-01 NOTE — Telephone Encounter (Signed)
Estill Bamberg with [facility] states the referral for pt was put in the system incorrectly. It must state specifically: "Ambulatory referral for Lung Cancer Screening"  They can then pull a CT from that referral, but not like it has been put in. Thank you

## 2015-05-08 ENCOUNTER — Encounter: Payer: Self-pay | Admitting: Family Medicine

## 2015-05-15 ENCOUNTER — Other Ambulatory Visit: Payer: Self-pay | Admitting: Family Medicine

## 2015-05-15 DIAGNOSIS — F17200 Nicotine dependence, unspecified, uncomplicated: Secondary | ICD-10-CM

## 2015-05-15 NOTE — Progress Notes (Signed)
Referral for lung cancer screening conrected.

## 2015-05-21 ENCOUNTER — Encounter: Payer: Self-pay | Admitting: Oncology

## 2015-06-27 ENCOUNTER — Ambulatory Visit (INDEPENDENT_AMBULATORY_CARE_PROVIDER_SITE_OTHER): Payer: BLUE CROSS/BLUE SHIELD | Admitting: Gynecology

## 2015-06-27 ENCOUNTER — Encounter: Payer: Self-pay | Admitting: Gynecology

## 2015-06-27 VITALS — BP 118/72 | Ht 64.5 in | Wt 113.6 lb

## 2015-06-27 DIAGNOSIS — Z01419 Encounter for gynecological examination (general) (routine) without abnormal findings: Secondary | ICD-10-CM

## 2015-06-27 DIAGNOSIS — Z853 Personal history of malignant neoplasm of breast: Secondary | ICD-10-CM

## 2015-06-27 NOTE — Progress Notes (Signed)
Tanya [name] 05-Oct-1950 [id number]   History:    65 y.o.  for annual gyn exam with no complaints. Patient with breast cancer history as follows: Notes from Dr. Penny Pia medical oncologist who is seen the patient documented the following on office visit 11/11/2014: "At the age of 63, the patient underwent lumpectomy and sentinel lymph node biopsy for a left sided T1(mic) N0, stage IA invasive breast cancer. She completed whole breast irradiation and received tamoxifen for 5 years. She has continued on raloxifene.  More recently, screening mammography at the breast Center with tomography 06/19/2014 showed calcifications in the left breast, which were further evaluated with diagnostic left mammography 06/26/2014. Breast density was category C. There was a 5 mm group of heterogeneous calcifications in the outer left breast. This was biopsied 07/04/2014, and showed ([id number]) invasive and in situ ductal carcinoma, grade 2. There was a minimal amount of invasive tumor, but it did allow for prognostic panel, which showed the cancer to be estrogen receptor 90% positive, progesterone receptor 80% positive, both with strong staining intensity, with an MIB-1 of 80%, and HER-2 amplified, with a signals ratio of 8.09, the number per cell being 8.90.  The patient then was evaluated at [facility] by Dr. Elisha Headland and, given the prior history of radiation, mastectomy with repeat sentinel lymph node sampling was suggested. This was performed 08/08/2014, and showed ([id number]) invasive ductal carcinoma, grade 1, measuring 0.3 cm. Margins were negative. All 3 sentinel lymph nodes sampled were negative. Repeat prognostic panel was obtained on the DCIS only, and found the tumor to be estrogen receptor positive at 65% and progesterone receptor positive at 75%. Patient was treated with paclitaxel and rastuzumab"  Patient currently on Herceptin and Arimed patient is also taking her calcium and vitamin D daily.  She does continue to smoke.  Patient brought a copy of her mammogram which was on the right breast was reported to be heterogeneously dense but otherwise negative.  Patient's last colonoscopy was normal in 2009. Her bone density study in 2015 demonstrated her lowest T score was minus 1. fourth of left femoral neck  Past medical history,surgical history, family history and social history were all reviewed and documented in the EPIC chart.  Gynecologic History No LMP recorded. Patient is postmenopausal. Contraception: post menopausal status Last Pap:  2014. Results were: normal Last mammogram:  See above. Results were: C above  Obstetric History OB History  Gravida Para Term Preterm AB SAB TAB Ectopic Multiple Living  3 0   2 1    0    # Outcome Date GA Lbr Len/2nd Weight Sex Delivery Anes PTL Lv  3 SAB           2 AB           1 Gravida                ROS: A ROS was performed and pertinent positives and negatives are included in the history.  GENERAL: No fevers or chills. HEENT: No change in vision, no earache, sore throat or sinus congestion. NECK: No pain or stiffness. CARDIOVASCULAR: No chest pain or pressure. No palpitations. PULMONARY: No shortness of breath, cough or wheeze. GASTROINTESTINAL: No abdominal pain, nausea, vomiting or diarrhea, melena or bright red blood per rectum. GENITOURINARY: No urinary frequency, urgency, hesitancy or dysuria. MUSCULOSKELETAL: No joint or muscle pain, no back pain, no recent trauma. DERMATOLOGIC: No rash, no itching, no lesions. ENDOCRINE: No polyuria, polydipsia, no heat  or cold intolerance. No recent change in weight. HEMATOLOGICAL: No anemia or easy bruising or bleeding. NEUROLOGIC: No headache, seizures, numbness, tingling or weakness. PSYCHIATRIC: No depression, no loss of interest in normal activity or change in sleep pattern.     Exam: chaperone present  BP 118/72 mmHg  Ht 5' 4.5" (1.638 m)  Wt 113 lb 9.6 oz (51.529 kg)  BMI 19.21  kg/m2  Body mass index is 19.21 kg/(m^2).  General appearance : Well developed well nourished female. No acute distress HEENT: Eyes: no retinal hemorrhage or exudates,  Neck supple, trachea midline, no carotid bruits, no thyroidmegaly Lungs: Clear to auscultation, no rhonchi or wheezes, or rib retractions  Heart: Regular rate and rhythm, no murmurs or gallops Breast:Examined in sitting and supine position were symmetrical in appearance, no palpable masses or tenderness,  no skin retraction, no nipple inversion, no nipple discharge, no skin discoloration, no axillary or supraclavicular lymphadenopathy Abdomen: no palpable masses or tenderness, no rebound or guarding Extremities: no edema or skin discoloration or tenderness  Pelvic:  Bartholin, Urethra, Skene Glands: Within normal limits             Vagina: No gross lesions or discharge, atrophic changes   Cervix: No gross lesions or discharge  Uterus  anteverted, normal size, shape and consistency, non-tender and mobile  Adnexa  Without masses or tenderness  Anus and perineum  normal   Rectovaginal  normal sphincter tone without palpated masses or tenderness             Hemoccult  PCP provides     Assessment/Plan:  65 y.o. female for annual exam  with history of left breast cancer as described above patient otherwise doing well. She scheduled for her bone density study later this year. She was reminded do her monthly breast exams. She was counseled once nightly the detrimental effects of smoking. We discussed importance of calcium vitamin D and weightbearing exercises for osteoporosis prevention. Pap smear was done today. PCP and oncologist have been doing her blood work.   [name] MD, 10:57 AM 06/27/2015

## 2015-06-27 NOTE — Patient Instructions (Signed)
Smoking Cessation, Tips for Success If you are ready to quit smoking, congratulations! You have chosen to help yourself be healthier. Cigarettes bring nicotine, tar, carbon monoxide, and other irritants into your body. Your lungs, heart, and blood vessels will be able to work better without these poisons. There are many different ways to quit smoking. Nicotine gum, nicotine patches, a nicotine inhaler, or nicotine nasal spray can help with physical craving. Hypnosis, support groups, and medicines help break the habit of smoking. WHAT THINGS CAN I DO TO MAKE QUITTING EASIER?  Here are some tips to help you quit for good:  Pick a date when you will quit smoking completely. Tell all of your friends and family about your plan to quit on that date.  Do not try to slowly cut down on the number of cigarettes you are smoking. Pick a quit date and quit smoking completely starting on that day.  Throw away all cigarettes.   Clean and remove all ashtrays from your home, work, and car.  On a card, write down your reasons for quitting. Carry the card with you and read it when you get the urge to smoke.  Cleanse your body of nicotine. Drink enough water and fluids to keep your urine clear or pale yellow. Do this after quitting to flush the nicotine from your body.  Learn to predict your moods. Do not let a bad situation be your excuse to have a cigarette. Some situations in your life might tempt you into wanting a cigarette.  Never have "just one" cigarette. It leads to wanting another and another. Remind yourself of your decision to quit.  Change habits associated with smoking. If you smoked while driving or when feeling stressed, try other activities to replace smoking. Stand up when drinking your coffee. Brush your teeth after eating. Sit in a different chair when you read the paper. Avoid alcohol while trying to quit, and try to drink fewer caffeinated beverages. Alcohol and caffeine may urge you to  smoke.  Avoid foods and drinks that can trigger a desire to smoke, such as sugary or spicy foods and alcohol.  Ask people who smoke not to smoke around you.  Have something planned to do right after eating or having a cup of coffee. For example, plan to take a walk or exercise.  Try a relaxation exercise to calm you down and decrease your stress. Remember, you may be tense and nervous for the first 2 weeks after you quit, but this will pass.  Find new activities to keep your hands busy. Play with a pen, coin, or rubber band. Doodle or draw things on paper.  Brush your teeth right after eating. This will help cut down on the craving for the taste of tobacco after meals. You can also try mouthwash.   Use oral substitutes in place of cigarettes. Try using lemon drops, carrots, cinnamon sticks, or chewing gum. Keep them handy so they are available when you have the urge to smoke.  When you have the urge to smoke, try deep breathing.  Designate your home as a nonsmoking area.  If you are a heavy smoker, ask your health care provider about a prescription for nicotine chewing gum. It can ease your withdrawal from nicotine.  Reward yourself. Set aside the cigarette money you save and buy yourself something nice.  Look for support from others. Join a support group or smoking cessation program. Ask someone at home or at work to help you with your plan   to quit smoking.  Always ask yourself, "Do I need this cigarette or is this just a reflex?" Tell yourself, "Today, I choose not to smoke," or "I do not want to smoke." You are reminding yourself of your decision to quit.  Do not replace cigarette smoking with electronic cigarettes (commonly called e-cigarettes). The safety of e-cigarettes is unknown, and some may contain harmful chemicals.  If you relapse, do not give up! Plan ahead and think about what you will do the next time you get the urge to smoke. HOW WILL I FEEL WHEN I QUIT SMOKING? You  may have symptoms of withdrawal because your body is used to nicotine (the addictive substance in cigarettes). You may crave cigarettes, be irritable, feel very hungry, cough often, get headaches, or have difficulty concentrating. The withdrawal symptoms are only temporary. They are strongest when you first quit but will go away within 10-14 days. When withdrawal symptoms occur, stay in control. Think about your reasons for quitting. Remind yourself that these are signs that your body is healing and getting used to being without cigarettes. Remember that withdrawal symptoms are easier to treat than the major diseases that smoking can cause.  Even after the withdrawal is over, expect periodic urges to smoke. However, these cravings are generally short lived and will go away whether you smoke or not. Do not smoke! WHAT RESOURCES ARE AVAILABLE TO HELP ME QUIT SMOKING? Your health care provider can direct you to community resources or hospitals for support, which may include:  Group support.  Education.  Hypnosis.  Therapy.   This information is not intended to replace advice given to you by your health care provider. Make sure you discuss any questions you have with your health care provider.   Document Released: 09/19/2003 Document Revised: 01/11/2014 Document Reviewed: 06/08/2012 Elsevier Interactive Patient Education 2016 [employer]  

## 2015-07-14 ENCOUNTER — Telehealth: Payer: Self-pay | Admitting: Gynecology

## 2015-07-14 NOTE — Telephone Encounter (Signed)
07/14/15-JF told pt I would check benefits for ov and u/s to be done here at the office. I checked codes [phone] and [phone]. I used dx codes Z85.3 and Z80.49 per JF. Kim at [facility] states all codes billable, no pa needed. Visit should be covered under $10 copay as we are considered a PCP under pt plan. Pt given this information today. Appt is already scheduled for 07/23/15. Per Kim@BC [phone]

## 2015-07-23 ENCOUNTER — Encounter: Payer: Self-pay | Admitting: Gynecology

## 2015-07-23 ENCOUNTER — Other Ambulatory Visit: Payer: Self-pay | Admitting: Gynecology

## 2015-07-23 ENCOUNTER — Ambulatory Visit (INDEPENDENT_AMBULATORY_CARE_PROVIDER_SITE_OTHER): Payer: BLUE CROSS/BLUE SHIELD | Admitting: Gynecology

## 2015-07-23 ENCOUNTER — Ambulatory Visit (INDEPENDENT_AMBULATORY_CARE_PROVIDER_SITE_OTHER): Payer: BLUE CROSS/BLUE SHIELD

## 2015-07-23 VITALS — BP 112/70

## 2015-07-23 DIAGNOSIS — Z853 Personal history of malignant neoplasm of breast: Secondary | ICD-10-CM

## 2015-07-23 DIAGNOSIS — N8300 Follicular cyst of ovary, unspecified side: Secondary | ICD-10-CM

## 2015-07-23 DIAGNOSIS — Z124 Encounter for screening for malignant neoplasm of cervix: Secondary | ICD-10-CM | POA: Diagnosis not present

## 2015-07-23 NOTE — Addendum Note (Signed)
Addended by: Thurnell Garbe A on: 07/23/2015 04:47 PM   Modules accepted: Orders

## 2015-07-23 NOTE — Progress Notes (Signed)
patient is a 65 year old who was seen in the office for her annual exam on June 23 is here for her annual ultrasound as a result of her past history of breast cancer. Her breast cancer history is as follows:  Notes from Dr. Penny Pia medical oncologist who is seen the patient documented the following on office visit 11/11/2014: "At the age of 81, the patient underwent lumpectomy and sentinel lymph node biopsy for a left sided T1(mic) N0, stage IA invasive breast cancer. She completed whole breast irradiation and received tamoxifen for 5 years. She has continued on raloxifene.  More recently, screening mammography at the breast Center with tomography 06/19/2014 showed calcifications in the left breast, which were further evaluated with diagnostic left mammography 06/26/2014. Breast density was category C. There was a 5 mm group of heterogeneous calcifications in the outer left breast. This was biopsied 07/04/2014, and showed ([id number]) invasive and in situ ductal carcinoma, grade 2. There was a minimal amount of invasive tumor, but it did allow for prognostic panel, which showed the cancer to be estrogen receptor 90% positive, progesterone receptor 80% positive, both with strong staining intensity, with an MIB-1 of 80%, and HER-2 amplified, with a signals ratio of 8.09, the number per cell being 8.90.  The patient then was evaluated at [facility] by Dr. Elisha Headland and, given the prior history of radiation, mastectomy with repeat sentinel lymph node sampling was suggested. This was performed 08/08/2014, and showed ([id number]) invasive ductal carcinoma, grade 1, measuring 0.3 cm. Margins were negative. All 3 sentinel lymph nodes sampled were negative. Repeat prognostic panel was obtained on the DCIS only, and found the tumor to be estrogen receptor positive at 65% and progesterone receptor positive at 75%. Patient was treated with paclitaxel and rastuzumab"  Patient currently on Herceptin and  Arimed patient is also taking her calcium and vitamin D daily. She does continue to smoke.  Review of my office notes from last office visit.was in the assumption that a Pap smear had been donebut after reviewing pathology and our logbook's it was not performed so the Pap smear was done today. Her ultrasound today demonstrated the following:  Uterus measured 4.4 x 3.8 x 12.2 cm with endometrial stripe of 1.4 mm. Right ovary atrophic with echo-free follicle measured 8 x 7 x 9 mm. Left ovary with microcalcifications. No apparent adnexal masses.   Assessment/plan:Patient with past history of breast cancer no major changes in her pelvic ultrasound otherwise normal. Patient to schedule her bone density study due to the year. She was counseled again on the detrimental effects of smoking.She should continue her calcium vitamin D and weightbearing exercises for osteoporosis prevention.    Greater than 50% time was spent calcium [place] care for this patient total time of consultation 10 minutes

## 2015-07-24 LAB — PAP IG W/ RFLX HPV ASCU

## 2015-09-01 ENCOUNTER — Other Ambulatory Visit: Payer: Self-pay | Admitting: Internal Medicine

## 2015-09-01 MED ORDER — VARENICLINE TARTRATE 1 MG PO TABS: 1.0000 mg | ORAL_TABLET | Freq: Two times a day (BID) | ORAL | 2 refills | Status: DC

## 2015-09-01 MED ORDER — VARENICLINE TARTRATE 0.5 MG X 11 & 1 MG X 42 PO MISC: ORAL | 0 refills | Status: DC

## 2015-10-02 ENCOUNTER — Other Ambulatory Visit: Payer: Self-pay | Admitting: *Deleted

## 2015-10-02 DIAGNOSIS — C50412 Malignant neoplasm of upper-outer quadrant of left female breast: Secondary | ICD-10-CM

## 2015-10-06 ENCOUNTER — Other Ambulatory Visit (HOSPITAL_BASED_OUTPATIENT_CLINIC_OR_DEPARTMENT_OTHER): Payer: BLUE CROSS/BLUE SHIELD

## 2015-10-06 DIAGNOSIS — C50412 Malignant neoplasm of upper-outer quadrant of left female breast: Secondary | ICD-10-CM | POA: Diagnosis not present

## 2015-10-06 LAB — CBC WITH DIFFERENTIAL/PLATELET
BASO%: 0.4 % (ref 0.0–2.0)
Basophils Absolute: 0 10*3/uL (ref 0.0–0.1)
EOS%: 1.5 % (ref 0.0–7.0)
Eosinophils Absolute: 0.1 10*3/uL (ref 0.0–0.5)
HCT: 46.4 % (ref 34.8–46.6)
HGB: 15.8 g/dL (ref 11.6–15.9)
LYMPH#: 1.5 10*3/uL (ref 0.9–3.3)
LYMPH%: 31.4 % (ref 14.0–49.7)
MCH: 33.6 pg (ref 25.1–34.0)
MCHC: 34.1 g/dL (ref 31.5–36.0)
MCV: 98.7 fL (ref 79.5–101.0)
MONO#: 0.4 10*3/uL (ref 0.1–0.9)
MONO%: 7.8 % (ref 0.0–14.0)
NEUT#: 2.8 10*3/uL (ref 1.5–6.5)
NEUT%: 58.9 % (ref 38.4–76.8)
Platelets: 167 10*3/uL (ref 145–400)
RBC: 4.7 10*6/uL (ref 3.70–5.45)
RDW: 12.8 % (ref 11.2–14.5)
WBC: 4.7 10*3/uL (ref 3.9–10.3)

## 2015-10-06 LAB — COMPREHENSIVE METABOLIC PANEL
ALT: 17 U/L (ref 0–55)
AST: 22 U/L (ref 5–34)
Albumin: 4.2 g/dL (ref 3.5–5.0)
Alkaline Phosphatase: 55 U/L (ref 40–150)
Anion Gap: 10 mEq/L (ref 3–11)
BUN: 16.1 mg/dL (ref 7.0–26.0)
CHLORIDE: 99 meq/L (ref 98–109)
CO2: 32 meq/L — AB (ref 22–29)
CREATININE: 0.8 mg/dL (ref 0.6–1.1)
Calcium: 10.1 mg/dL (ref 8.4–10.4)
EGFR: 77 mL/min/{1.73_m2} — ABNORMAL LOW (ref >90)
Glucose: 87 mg/dl (ref 70–140)
POTASSIUM: 4.5 meq/L (ref 3.5–5.1)
SODIUM: 142 meq/L (ref 136–145)
Total Bilirubin: 0.47 mg/dL (ref 0.20–1.20)
Total Protein: 7.5 g/dL (ref 6.4–8.3)

## 2015-10-13 ENCOUNTER — Ambulatory Visit (HOSPITAL_BASED_OUTPATIENT_CLINIC_OR_DEPARTMENT_OTHER): Payer: BLUE CROSS/BLUE SHIELD

## 2015-10-13 ENCOUNTER — Ambulatory Visit (HOSPITAL_BASED_OUTPATIENT_CLINIC_OR_DEPARTMENT_OTHER): Payer: BLUE CROSS/BLUE SHIELD | Admitting: Oncology

## 2015-10-13 VITALS — BP 116/68 | HR 72 | Temp 98.5°F | Resp 18 | Ht 64.5 in | Wt 121.7 lb

## 2015-10-13 DIAGNOSIS — Z720 Tobacco use: Secondary | ICD-10-CM

## 2015-10-13 DIAGNOSIS — Z79811 Long term (current) use of aromatase inhibitors: Secondary | ICD-10-CM | POA: Diagnosis not present

## 2015-10-13 DIAGNOSIS — C50412 Malignant neoplasm of upper-outer quadrant of left female breast: Secondary | ICD-10-CM | POA: Diagnosis not present

## 2015-10-13 DIAGNOSIS — Z23 Encounter for immunization: Secondary | ICD-10-CM

## 2015-10-13 DIAGNOSIS — M25552 Pain in left hip: Secondary | ICD-10-CM | POA: Diagnosis not present

## 2015-10-13 DIAGNOSIS — Z170 Estrogen receptor positive status [ER+]: Principal | ICD-10-CM

## 2015-10-13 MED ORDER — INFLUENZA VAC SPLIT QUAD 0.5 ML IM SUSY
0.5000 mL | PREFILLED_SYRINGE | Freq: Once | INTRAMUSCULAR | Status: AC
  Administered 2015-10-13: 0.5 mL via INTRAMUSCULAR
  Filled 2015-10-13: qty 0.5

## 2015-10-13 NOTE — Progress Notes (Signed)
Thank you we're perfect [facility]  [phone] [phone]     ID: Tanya [name] DOB: 08-13-1950  MR#: [id number]  BVT#:[id number]  Patient Care Team: [name] [place]az[place], MD as PCP - General (Family Medicine) Hart Carwin, MD (Inactive) (Gastroenterology) Ok Edwards, MD (Obstetrics and Gynecology) Lowella Dell, MD as Consulting Physician (Oncology) Lovena Neighbours, MD as Referring Physician (Surgery) PCP: Betty [place]az[place], MD OTHER MD: Rollene Rotunda MD, Campbell Stall MD  CHIEF COMPLAINT: Estrogen receptor positive breast cancer  CURRENT TREATMENT: Anastrozole   BREAST CANCER HISTORY: From the original intake note:  At the age of 19, the patient underwent lumpectomy and sentinel lymph node biopsy for a left sided T1(mic) N0, stage IA invasive breast cancer. She completed whole breast irradiation and received tamoxifen for 5 years. She has continued on raloxifene.  More recently, screening mammography at the breast Center with tomography 06/19/2014 showed calcifications in the left breast, which were further evaluated with diagnostic left mammography 06/26/2014. Breast density was category C. There was a 5 mm group of heterogeneous calcifications in the outer left breast. This was biopsied 07/04/2014, and showed ([id number]) invasive and in situ ductal carcinoma, grade 2. There was a minimal amount of invasive tumor, but it did allow for prognostic panel, which showed the cancer to be estrogen receptor 90% positive, progesterone receptor 80% positive, both with strong staining intensity, with an MIB-1 of 80%, and HER-2 amplified, with a signals ratio of 8.09, the number per cell being 8.90.  The patient then was evaluated at [facility] by Dr. Flint Melter and, given the prior history of radiation, mastectomy with repeat sentinel lymph node sampling was suggested. This was performed 08/08/2014, and showed ([id number]) invasive ductal  carcinoma, grade 1, measuring 0.3 cm. Margins were negative. All 3 sentinel lymph nodes sampled were negative. Repeat prognostic panel was obtained on the DCIS only, and found the tumor to be estrogen receptor positive at 65% and progesterone receptor positive at 75%.  The patient's subsequent history is as detailed below.   INTERVAL HISTORY: (zometa December?) [name] returns today for follow-up of her estrogen receptor positive breast cancer. she continues on anastrozole. She obtains it at a very good price and hot flashes are not a major issue. Where she is having in addition to her chronic left hip pain, more arthritis symmetrically involving hands and elbows and shoulders for example this could well be due to the medication.  REVIEW OF SYSTEMS: [name] a major try, even though it made her nauseated. She started taking antinausea medicines so she could continue it. After 2 weeks he was just too much. Unfortunately her husband was supposed to quit smoking with her has never started the Chantix. She continues to have significant left hip pain. She knows that she has spurs there. This does limit what she can do. She is afraid to trust that she sets. Aside from these issues a detailed review of systems today was noncontributory   PAST MEDICAL HISTORY: Past Medical History:  Diagnosis Date  . Breast cancer (HCC)    Radiation and lumpectomy  . COPD, mild (HCC)   . Family history of breast cancer   . Family history of uterine cancer   . Osteopenia    DEXA 10/27/11: -1.4, no change from 10/2009  . Shingles   . Smoker     PAST SURGICAL HISTORY: Past Surgical History:  Procedure Laterality Date  . BREAST BIOPSY    . BREAST  LUMPECTOMY    . DILATION AND EVACUATION    . MASTECTOMY Left AUG 2016  . TONSILLECTOMY AND ADENOIDECTOMY      FAMILY HISTORY Family History  Problem Relation Age of Onset  . CAD Mother     stent older age  . Hypertension Mother   . Breast cancer Mother  53  . Cancer Mother     SKIN and BLADDER   . Heart disease Paternal Grandfather   . Lung cancer Paternal Grandfather   . Hypertension Father   . Heart disease Father 1    CHF   . Dementia Father   . Hearing loss Maternal Grandfather   . Parkinson's disease Paternal Grandmother    the patient's father died with Alzheimer's disease at age 67. The patient's mother was diagnosed with breast cancer at age 68. She died from C. difficile colitis at age 47. The patient has one brother, one sister. There is no history of breast or ovarian cancer in the immediate family.  GYNECOLOGIC HISTORY:  No LMP recorded. Patient is postmenopausal. Menarche age 61, the patient is GX P0. She underwent menopause approximately the year 2000. She did not take hormone replacement. 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 Her husband, [name], used to be in Press photographer (Set designer) but now has had 2 back fusions and is disabled. At home is just the 2 of them plus her dog Max   ADVANCED DIRECTIVES: In place   HEALTH MAINTENANCE: Social History  Substance Use Topics  . Smoking status: Current Every Day Smoker    Packs/day: 1.00    Years: 30.00    Types: Cigarettes  . Smokeless tobacco: Never Used     Comment: Trying to smoke less.   . Alcohol use 8.4 oz/week    14 Standard drinks or equivalent per week     Comment: 5 DRINKS A WEEK     Colonoscopy: November 2009  PAP:  Bone density: November 2015:  Lipid panel:  Allergies  Allergen Reactions  . Codeine     REACTION: Rash  . Tetanus Toxoid     REACTION: Shock    Current Outpatient Prescriptions  Medication Sig Dispense Refill  . Calcium Carbonate-Vitamin D 600-400 MG-UNIT tablet Take 1 tablet by mouth 2 (two) times daily.    . Cholecalciferol (VITAMIN D-1000 MAX ST) 1000 UNITS tablet Take 1,000 Units by mouth.    Marland Kitchen  ibuprofen (MOTRIN IB) 200 MG tablet Take 1 tablet (200 mg total) by mouth every 6 (six) hours as needed. 30 tablet 0  . Misc Natural Products (OSTEO BI-FLEX TRIPLE STRENGTH) TABS Take 1 tablet by mouth 2 (two) times daily.    . Multiple Vitamin (MULTIVITAMIN) tablet Take 1 tablet by mouth daily.    Marland Kitchen zolendronic acid (ZOMETA) 4 MG/5ML injection Inject 4 mg into the vein once. ONCE A YEAR     No current facility-administered medications for this visit.     BP: 116/68  Pulse: 72  Resp: 18  Temp: 98.5 F (36.9 C)     Body mass index is 20.57 kg/m.      ECOG FS:1 - Symptomatic but completely ambulatory  Sclerae unicteric, pupils round and equal Oropharynx clear and moist-- no thrush or other lesions No cervical or supraclavicular adenopathy Lungs no rales or rhonchi Heart regular rate and rhythm Abd soft, nontender, positive bowel sounds MSK  no focal spinal tenderness, no upper extremity lymphedema Neuro: nonfocal, well oriented, appropriate affect Breasts: The right breast is unremarkable. The left breast is status post lumpectomy and radiation. There is no evidence of local recurrence. The left axilla is benign   LAB RESULTS:  INo results found for: SPEP, UPEP  CBC Latest Ref Rng & Units 10/06/2015 04/07/2015 12/27/2014  WBC 3.9 - 10.3 10e3/uL 4.7 5.3 5.1  Hemoglobin 11.6 - 15.9 g/dL 15.8 15.7 14.3  Hematocrit 34.8 - 46.6 % 46.4 45.2 42.7  Platelets 145 - 400 10e3/uL 167 164 198     CMP Latest Ref Rng & Units 10/06/2015 04/07/2015 12/27/2014  Glucose 70 - 140 mg/dl 87 91 67(L)  BUN 7.0 - 26.0 mg/dL 16.1 15.2 18.3  Creatinine 0.6 - 1.1 mg/dL 0.8 0.8 0.9  Sodium 136 - 145 mEq/L 142 142 141  Potassium 3.5 - 5.1 mEq/L 4.5 4.7 4.2  Chloride 96 - 112 mEq/L - - -  CO2 22 - 29 mEq/L 32(H) 34(H) 29  Calcium 8.4 - 10.4 mg/dL 10.1 10.3 9.9  Total Protein 6.4 - 8.3 g/dL 7.5 7.5 6.9  Total Bilirubin 0.20 - 1.20 mg/dL 0.47 0.45 0.37  Alkaline Phos 40 - 150 U/L 55 42 53  AST 5 - 34 U/L  _0 ALT 0 - 55 U/L _1 No results found for: LABCA2  No components found for: [id number]  No results for input(s): INR in the last 168 hours.  Urinalysis    Component Value Date/Time   COLORURINE YELLOW 04/03/2014 1059   APPEARANCEUR CLEAR 04/03/2014 1059   LABSPEC <=1.005 (A) 04/03/2014 1059   PHURINE 7.0 04/03/2014 1059   GLUCOSEU NEGATIVE 04/03/2014 1059   HGBUR TRACE-INTACT (A) 04/03/2014 1059   BILIRUBINUR NEGATIVE 04/03/2014 1059   KETONESUR NEGATIVE 04/03/2014 1059   PROTEINUR NEG 06/08/2013 1016   UROBILINOGEN 0.2 04/03/2014 1059   NITRITE NEGATIVE 04/03/2014 1059   LEUKOCYTESUR NEGATIVE 04/03/2014 1059    STUDIES:------------------- at [facility] 02/21/2015 No mammographic evidence of malignancy.  Breast composition: Heterogeneously dense.  BI-RADS Category 1- Negative  RECOMMENDATIONS: Follow-up in 1 year.  [place] law now requires that mammography facilities inform their patients of their breast density and that higher density may increase their risk of cancer and may decrease sensitivity of mammography. Also, if heterogeneously dense or extremely dense, they should talk with their doctor about alternative screening strategies. The following website has been developed to assist patients and physicians: [email or web address].org/breast-health.[email or web address]. The patient lay summary letter includes their breast density, a link to the density website and for patients with dense breasts, a recommendation to talk with their doctor.   Result Narrative  MAMMOGRAM RIGHT BREAST, 02/21/2015 11:10 AM  INDICATION:  BREAST CANCERC50.112 Malignant neoplasm of central portion of left female breast ([place])   COMPARISON: Multiple priors.  TECHNIQUE: CC and MLO views were performed of the right breast with digital technique and computer-aided detection. The examination was supplemented with 3D tomosynthesis.  FINDINGS:   No suspicious mass or calcifications are seen in the right breast.            ASSESSMENT: 65 y.o. [place] woman  (1) status post left Breast upper outer quadrant lumpectomy and sentinel lymph node sampling 1999 for a T1(mic) N0, stage IA invasive breast cancer,  (a) status post adjuvant radiation  (b) status post tamoxifen for 5 years  (2) status post left breast biopsy 07/04/2014 for a clinical T1a N0, stage IA invasive ductal  carcinoma, triple positive, with an MIB-1 of 80%--the patient was on raloxifene at the time  (3) status post left mastectomy and sentinel lymph node sampling 08/06/2014 for a pT1a pN0, stage IA invasive ductal carcinoma, grade 2, with negative margins. There was insufficient tissue to repeat HER-2 testing  (4) adjuvant chemotherapy started 09/16/2014 consisting of paclitaxel weekly 12 with trastuzumab given every 3 weeks, continuing the trastuzumab alone to complete 1 year, but stopped 12/16/2014 for financial reasons  (a) completed 12 doses of paclitaxel 12/02/2014.  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  (7) tobacco abuse: The patient has been strongly advised to quit  (a) Wellbutrin prescribed 11/11/2014, not started by patient will  (b) unable to tolerate Chantix  PLAN: Winna is now a little over a year out from definitive surgery for her breast cancer with no evidence of disease recurrence. This is very favorable.  It is difficult for me to tell whether the pain she is experiencing are due to anastrozole or simply to arthritis. The only way to find out is to stop the medication. After much discussion she is agreeable to this. She will be off and anastrozole until she returns to see me the first  week in December.  At that time, if she is no better, we will go back to anastrozole. However if the problem resolved, then we will consider either letrozole or exemestane. Recall that her most recent breast cancer occurred while she was on raloxifene, so going back to tamoxifen would not be a good idea.  We discussed smoking cessation at length today. At this point she is not seeing a way for it in terms of discontinuing smoking.  She also has significant left hip pain. This is chronic. This is limiting her activities. We discussed swimming, which she doesn't do, and using a stationary bike. She has had an unofficial consult with an orthopedist. I suggested she might want to go back there for formal evaluation and consider replacement  Finally we discussed his alendronate. She is not planning to have any extractions. Most likely we will proceed to Zometa next visit when she returns to see me.   Chauncey Cruel, MD   10/13/2015 11:43 AM

## 2015-10-14 ENCOUNTER — Other Ambulatory Visit: Payer: Self-pay | Admitting: Gynecology

## 2015-10-14 ENCOUNTER — Telehealth: Payer: Self-pay | Admitting: *Deleted

## 2015-10-14 DIAGNOSIS — Z853 Personal history of malignant neoplasm of breast: Secondary | ICD-10-CM

## 2015-10-14 NOTE — Telephone Encounter (Signed)
Pt called asking for order to be placed at breast center for dexa, per note on "Patient to schedule her bone density study due to the year." order placed pt will call to schedule.

## 2015-10-28 ENCOUNTER — Telehealth: Payer: Self-pay | Admitting: *Deleted

## 2015-10-28 NOTE — Telephone Encounter (Signed)
See prior entry.

## 2015-10-28 NOTE — Telephone Encounter (Signed)
This RN spoke with pt per her call concerning new insurance and billing of zometa.  She inquired what code the zometa she will be receiving in December is under " I have a new insurance and if the zometa is coded as preventative it is covered at 100% otherwise I am responsible for the cost "  The above will be forwarded to Gaspar Bidding in [place].  Pt is scheduled for yearly zometa for prevention of osteoporosis secondary to use of aromatase inhibitors for history of ER/PR + breast cancer

## 2015-11-14 ENCOUNTER — Encounter (HOSPITAL_COMMUNITY): Payer: Self-pay

## 2015-11-17 ENCOUNTER — Telehealth: Payer: Self-pay

## 2015-11-17 ENCOUNTER — Ambulatory Visit
Admission: RE | Admit: 2015-11-17 | Discharge: 2015-11-17 | Disposition: A | Discharge status: Home / Self Care | Payer: BLUE CROSS/BLUE SHIELD | Source: Ambulatory Visit | Attending: Gynecology | Admitting: Gynecology

## 2015-11-17 DIAGNOSIS — Z853 Personal history of malignant neoplasm of breast: Secondary | ICD-10-CM

## 2015-11-17 NOTE — Telephone Encounter (Signed)
-----   Message from [name], MD sent at 11/17/2015 11:21 AM EST ----- Please inform patient that I have reviewed her bone density study and now she is osteoporotic and we need to sit down and discuss treatment options. She is not interested calcium and vitamin D. Please have her come by the office the week before the visit to check her vitamin D level

## 2015-11-17 NOTE — Telephone Encounter (Signed)
Your result note was confusing as you said "She is not interested calcium and Vit D."  She said to let you know that she IS taking 600 mg of Calcium and Vitamin D 1000 mg.   She also wanted to remind you that she had infusion on Zometa (sp?) last year. She had breast cancer again last year and is taking Anastrozole and a side affect is poor bone density.  She asked if any of that changed your recommendation?

## 2015-11-17 NOTE — Telephone Encounter (Signed)
Sorry for any confusion. She can stay on her current medication and go over the bone density with her medical oncologist to see if he wants to make any recommendations. Thank you

## 2015-11-17 NOTE — Telephone Encounter (Signed)
Already had encounter opened. 

## 2015-11-18 NOTE — Telephone Encounter (Signed)
Patient informed. 

## 2015-11-19 ENCOUNTER — Encounter: Payer: Self-pay | Admitting: Oncology

## 2015-11-19 NOTE — Progress Notes (Signed)
Returned pt's call regarding how much Zometa treatment will be and how it's coded.  I informed her I didn't know that answer to those questions and gave her the number to our pharmacy and the coders in hopes they can provide her with the answers.

## 2015-12-01 ENCOUNTER — Encounter: Payer: Self-pay | Admitting: Oncology

## 2015-12-01 ENCOUNTER — Telehealth: Payer: Self-pay | Admitting: *Deleted

## 2015-12-01 ENCOUNTER — Telehealth: Payer: Self-pay | Admitting: Oncology

## 2015-12-01 NOTE — Telephone Encounter (Signed)
Per staff message from scheduler I have canceled aappts for 12/4

## 2015-12-01 NOTE — Telephone Encounter (Signed)
Patient called to cancel infusion appointment only. No intent to cancel other appointments nor reschedule infusion appointment.

## 2015-12-02 ENCOUNTER — Telehealth: Payer: Self-pay | Admitting: Oncology

## 2015-12-02 NOTE — Telephone Encounter (Signed)
Patient called to have MD and lab appointments rescheduled for 12/4. On 11/27 the patient called to cancel her infusion appointment only. She requested to keep her lab and MD appointments scheduled as they were. Lab and MD appointment accidentally canceled by infusion nurse.

## 2015-12-05 ENCOUNTER — Other Ambulatory Visit: Payer: Self-pay | Admitting: *Deleted

## 2015-12-05 DIAGNOSIS — C50412 Malignant neoplasm of upper-outer quadrant of left female breast: Secondary | ICD-10-CM

## 2015-12-05 DIAGNOSIS — Z170 Estrogen receptor positive status [ER+]: Principal | ICD-10-CM

## 2015-12-08 ENCOUNTER — Ambulatory Visit (HOSPITAL_BASED_OUTPATIENT_CLINIC_OR_DEPARTMENT_OTHER): Payer: BLUE CROSS/BLUE SHIELD | Admitting: Oncology

## 2015-12-08 ENCOUNTER — Ambulatory Visit: Payer: BLUE CROSS/BLUE SHIELD | Admitting: Oncology

## 2015-12-08 ENCOUNTER — Other Ambulatory Visit: Payer: BLUE CROSS/BLUE SHIELD

## 2015-12-08 ENCOUNTER — Other Ambulatory Visit (HOSPITAL_BASED_OUTPATIENT_CLINIC_OR_DEPARTMENT_OTHER): Payer: BLUE CROSS/BLUE SHIELD

## 2015-12-08 ENCOUNTER — Ambulatory Visit: Payer: BLUE CROSS/BLUE SHIELD

## 2015-12-08 VITALS — BP 122/61 | HR 80 | Temp 98.0°F | Resp 18 | Ht 64.5 in | Wt 121.7 lb

## 2015-12-08 DIAGNOSIS — Z720 Tobacco use: Secondary | ICD-10-CM

## 2015-12-08 DIAGNOSIS — M8580 Other specified disorders of bone density and structure, unspecified site: Secondary | ICD-10-CM | POA: Diagnosis not present

## 2015-12-08 DIAGNOSIS — Z170 Estrogen receptor positive status [ER+]: Secondary | ICD-10-CM | POA: Diagnosis not present

## 2015-12-08 DIAGNOSIS — C50412 Malignant neoplasm of upper-outer quadrant of left female breast: Secondary | ICD-10-CM

## 2015-12-08 LAB — CBC WITH DIFFERENTIAL/PLATELET
BASO%: 0.7 % (ref 0.0–2.0)
BASOS ABS: 0 10*3/uL (ref 0.0–0.1)
EOS%: 0.9 % (ref 0.0–7.0)
Eosinophils Absolute: 0 10*3/uL (ref 0.0–0.5)
HEMATOCRIT: 45.3 % (ref 34.8–46.6)
HGB: 15 g/dL (ref 11.6–15.9)
LYMPH#: 1.6 10*3/uL (ref 0.9–3.3)
LYMPH%: 28.9 % (ref 14.0–49.7)
MCH: 33.3 pg (ref 25.1–34.0)
MCHC: 33.2 g/dL (ref 31.5–36.0)
MCV: 100.2 fL (ref 79.5–101.0)
MONO#: 0.4 10*3/uL (ref 0.1–0.9)
MONO%: 8.3 % (ref 0.0–14.0)
NEUT#: 3.3 10*3/uL (ref 1.5–6.5)
NEUT%: 61.2 % (ref 38.4–76.8)
PLATELETS: 166 10*3/uL (ref 145–400)
RBC: 4.52 10*6/uL (ref 3.70–5.45)
RDW: 12.9 % (ref 11.2–14.5)
WBC: 5.4 10*3/uL (ref 3.9–10.3)

## 2015-12-08 LAB — COMPREHENSIVE METABOLIC PANEL
ALT: 17 U/L (ref 0–55)
ANION GAP: 9 meq/L (ref 3–11)
AST: 21 U/L (ref 5–34)
Albumin: 4 g/dL (ref 3.5–5.0)
Alkaline Phosphatase: 57 U/L (ref 40–150)
BUN: 17 mg/dL (ref 7.0–26.0)
CALCIUM: 9.9 mg/dL (ref 8.4–10.4)
CHLORIDE: 101 meq/L (ref 98–109)
CO2: 31 mEq/L — ABNORMAL HIGH (ref 22–29)
Creatinine: 0.8 mg/dL (ref 0.6–1.1)
EGFR: 84 mL/min/{1.73_m2} — ABNORMAL LOW (ref >90)
Glucose: 67 mg/dl — ABNORMAL LOW (ref 70–140)
POTASSIUM: 4.4 meq/L (ref 3.5–5.1)
Sodium: 141 mEq/L (ref 136–145)
Total Bilirubin: 0.41 mg/dL (ref 0.20–1.20)
Total Protein: 7.2 g/dL (ref 6.4–8.3)

## 2015-12-08 MED ORDER — ALENDRONATE SODIUM 70 MG PO TABS: 70.0000 mg | ORAL_TABLET | ORAL | 4 refills | Status: DC

## 2015-12-08 NOTE — Progress Notes (Signed)
Thank you we're perfect [place]  [phone] [phone]     ID: Tanya [name] DOB: 1950-07-30  MR#: [id number]  IZT#:[id number]  Patient Care Team: Tanya G [name], MD as PCP - General (Family Medicine) Tanya Dragon, MD (Inactive) (Gastroenterology) Tanya Mass, MD (Obstetrics and Gynecology) Tanya Cruel, MD as Consulting Physician (Oncology) Tanya Ok, MD as Referring Physician (Surgery) PCP: Tanya [name], MD OTHER MD: Tanya Breeding MD, Tanya Luria MD  CHIEF COMPLAINT: Estrogen receptor positive breast cancer  CURRENT TREATMENT: Observation   BREAST CANCER HISTORY: From the original intake note:  At the age of 43, the patient underwent lumpectomy and sentinel lymph node biopsy for a left sided T1(mic) N0, stage IA invasive breast cancer. She completed whole breast irradiation and received tamoxifen for 5 years. She has continued on raloxifene.  More recently, screening mammography at the breast Center with tomography 06/19/2014 showed calcifications in the left breast, which were further evaluated with diagnostic left mammography 06/26/2014. Breast density was category C. There was a 5 mm group of heterogeneous calcifications in the outer left breast. This was biopsied 07/04/2014, and showed ([id number]) invasive and in situ ductal carcinoma, grade 2. There was a minimal amount of invasive tumor, but it did allow for prognostic panel, which showed the cancer to be estrogen receptor 90% positive, progesterone receptor 80% positive, both with strong staining intensity, with an MIB-1 of 80%, and HER-2 amplified, with a signals ratio of 8.09, the number per cell being 8.90.  The patient then was evaluated at [facility] by Dr. Elisha Headland and, given the prior history of radiation, mastectomy with repeat sentinel lymph node sampling was suggested. This was performed 08/08/2014, and showed ([id number]) invasive ductal  carcinoma, grade 1, measuring 0.3 cm. Margins were negative. All 3 sentinel lymph nodes sampled were negative. Repeat prognostic panel was obtained on the DCIS only, and found the tumor to be estrogen receptor positive at 65% and progesterone receptor positive at 75%.  The patient's subsequent history is as detailed below.   INTERVAL HISTORY: (zometa December?) Tanya [name] returns today for follow-up of her estrogen receptor positive breast cancer. She went off the anastrozole after the last visit here. She felt better so she has not resumed that medication  Since that time she has had a bone density which shows osteoporosis. She is wondering if she should take any medication for that.  She also saw orthopedics. They have recommended a left total hip replacement. She is willing to undergo that but she wants to get her husband through his left arm surgery first.  REVIEW OF SYSTEMS: Tanya [name] unfortunately is still smoking. She is having discomfort in the medial condyle of both elbows, , possibly related to her recent work in the yard. She is otherwise not exercising regularly. She does not below the gym. She still has some numbness in her toes from the chemotherapy, but she only feels that when she showers. A detailed review of systems today was stable  PAST MEDICAL HISTORY: Past Medical History:  Diagnosis Date  . Breast cancer ([place])    Radiation and lumpectomy  . COPD, mild ([place])   . Family history of breast cancer   . Family history of uterine cancer   . Osteopenia    DEXA 10/27/11: -1.4, no change from 10/2009  . Shingles   . Smoker     PAST SURGICAL HISTORY: Past Surgical History:  Procedure Laterality Date  . BREAST BIOPSY    .  BREAST LUMPECTOMY    . DILATION AND EVACUATION    . MASTECTOMY Left AUG 2016  . TONSILLECTOMY AND ADENOIDECTOMY      FAMILY HISTORY Family History  Problem Relation Age of Onset  . CAD Mother     stent older age  . Hypertension Mother   . Breast  cancer Mother 19  . Cancer Mother     SKIN and BLADDER   . Heart disease Paternal Grandfather   . Lung cancer Paternal Grandfather   . Hypertension Father   . Heart disease Father 49    CHF   . Dementia Father   . Hearing loss Maternal Grandfather   . Parkinson's disease Paternal Grandmother    the patient's father died with Alzheimer's disease at age 45. The patient's mother was diagnosed with breast cancer at age 20. She died from C. difficile colitis at age 109. The patient has one brother, one sister. There is no history of breast or ovarian cancer in the immediate family.  GYNECOLOGIC HISTORY:  No LMP recorded. Patient is postmenopausal. Menarche age 4, the patient is GX P0. She underwent menopause approximately the year 2000. She did not take hormone replacement. 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 Her husband, Tanya [name], used to be in Airline pilot (Mining engineer) but now has had 2 back fusions and is disabled. At home is just the 2 of them plus her dog Tanya [name]   ADVANCED DIRECTIVES: In place   HEALTH MAINTENANCE: Social History  Substance Use Topics  . Smoking status: Current Every Day Smoker    Packs/day: 1.00    Years: 30.00    Types: Cigarettes  . Smokeless tobacco: Never Used     Comment: Trying to smoke less.   . Alcohol use 8.4 oz/week    14 Standard drinks or equivalent per week     Comment: 5 DRINKS A WEEK     Colonoscopy: November 2009  PAP:  Bone density: November 2015:  Lipid panel:  Allergies  Allergen Reactions  . Codeine     REACTION: Rash  . Tetanus Toxoid     REACTION: Shock    Current Outpatient Prescriptions  Medication Sig Dispense Refill  . Calcium Carbonate-Vitamin D 600-400 MG-UNIT tablet Take 1 tablet by mouth 2 (two) times daily.    . Cholecalciferol (VITAMIN D-1000 Tanya [name] ST) 1000 UNITS tablet Take 1,000 Units  by mouth.    Marland Kitchen ibuprofen (MOTRIN IB) 200 MG tablet Take 1 tablet (200 mg total) by mouth every 6 (six) hours as needed. 30 tablet 0  . Misc Natural Products (OSTEO BI-FLEX TRIPLE STRENGTH) TABS Take 1 tablet by mouth 2 (two) times daily.    . Multiple Vitamin (MULTIVITAMIN) tablet Take 1 tablet by mouth daily.    Marland Kitchen zolendronic acid (ZOMETA) 4 MG/5ML injection Inject 4 mg into the vein once. ONCE A YEAR     No current facility-administered medications for this visit.     Vitals:   12/08/15 1036  BP: 122/61  Pulse: 80  Resp: 18  Temp: 98 F (36.7 C)  TempSrc: Oral  SpO2: 98%  Weight: 121 lb 11.2 oz (55.2 kg)  Height: 5' 4.5" (1.638 m)     ECOG FS:1 - Symptomatic but completely ambulatory  Sclerae unicteric, EOMs intact Oropharynx clear and moist No cervical or supraclavicular adenopathy Lungs no rales or rhonchi Heart regular rate  and rhythm Abd soft, nontender, positive bowel sounds MSK no focal spinal tenderness, no upper extremity lymphedema Neuro: nonfocal, well oriented, appropriate affect Breasts: The right breast is unremarkable. The left breast is status post mastectomy. There is no evidence of local recurrence. The left axilla is benign.   LAB RESULTS: CBC    Component Value Date/Time   WBC 5.4 12/08/2015 1024   WBC 5.2 04/03/2014 1059   RBC 4.52 12/08/2015 1024   RBC 4.61 04/03/2014 1059   HGB 15.0 12/08/2015 1024   HCT 45.3 12/08/2015 1024   PLT 166 12/08/2015 1024   MCV 100.2 12/08/2015 1024   MCH 33.3 12/08/2015 1024   MCH 34.1 (H) 06/08/2013 1016   MCHC 33.2 12/08/2015 1024   MCHC 34.4 04/03/2014 1059   RDW 12.9 12/08/2015 1024   LYMPHSABS 1.6 12/08/2015 1024   MONOABS 0.4 12/08/2015 1024   EOSABS 0.0 12/08/2015 1024   BASOSABS 0.0 12/08/2015 1024   CMP Latest Ref Rng & Units 12/08/2015 10/06/2015 04/07/2015  Glucose 70 - 140 mg/dl 67(L) 87 91  BUN 7.0 - 26.0 mg/dL 17.0 16.1 15.2  Creatinine 0.6 - 1.1 mg/dL 0.8 0.8 0.8  Sodium 136 - 145 mEq/L 141  142 142  Potassium 3.5 - 5.1 mEq/L 4.4 4.5 4.7  Chloride 96 - 112 mEq/L - - -  CO2 22 - 29 mEq/L 31(H) 32(H) 34(H)  Calcium 8.4 - 10.4 mg/dL 9.9 10.1 10.3  Total Protein 6.4 - 8.3 g/dL 7.2 7.5 7.5  Total Bilirubin 0.20 - 1.20 mg/dL 0.41 0.47 0.45  Alkaline Phos 40 - 150 U/L 57 55 42  AST 5 - 34 U/L _0 ALT 0 - 55 U/L _1 No results found for: LABCA2  No components found for: LABCA125  No results for input(s): INR in the last 168 hours.  Urinalysis    Component Value Date/Time   COLORURINE YELLOW 04/03/2014 1059   APPEARANCEUR CLEAR 04/03/2014 1059   LABSPEC <=1.005 (A) 04/03/2014 1059   PHURINE 7.0 04/03/2014 1059   GLUCOSEU NEGATIVE 04/03/2014 1059   HGBUR TRACE-INTACT (A) 04/03/2014 1059   BILIRUBINUR NEGATIVE 04/03/2014 1059   KETONESUR NEGATIVE 04/03/2014 1059   PROTEINUR NEG 06/08/2013 1016   UROBILINOGEN 0.2 04/03/2014 1059   NITRITE NEGATIVE 04/03/2014 1059   LEUKOCYTESUR NEGATIVE 04/03/2014 1059    STUDIES:- Name: SHIANNE, ZEISER Patient ID: [id number] Birth Date: 20-Sep-1950 Height: 65.0 in. Sex: Female Measured: 11/17/2015 Weight: 120.0 lbs. Indications: Anastrazole, Breast Cancer History, Caucasian, Estrogen Deficient, Family Hist. (Parent hip fracture), Family History of Osteoporosis, Height Loss (781.91), High risk medication use, History of Fracture (Adult) (V15.51), Low Body Weight (783.22), Postmenopausal, Tobacco User (Current Smoker) Fractures: Finger Treatments: Calcium (E943.0), Multivitamin, Vitamin D (E933.5), zometa  ASSESSMENT: The BMD measured at Femur Total Left is 0.698 g/cm2 with a T-score of -2.5.  This patient is considered OSTEOPOROTIC according to [facility]) criteria.  L-3 was excluded due to degenerative changes.  Patient does not meet criteria for FRAX assessment.  There has been a statistically significant decrease in BMD of the left hip since prior exam dated 11/12/2013.  There has  been no statistically significant change in BMD of the lumbar spine since prior exam dated 11.09.2015.  Site Region Measured Date Measured Age YA BMD Significant CHANGE T-score DualFemur Total Left 11/17/2015 63.7 -2.5 0.698 g/cm2 *  AP Spine  L1-L4 (L3) 11/17/2015    63.7         -1.3  1.027 g/cm2  World Health Organization [facility]) criteria for post-menopausal, Caucasian Women: Normal       T-score at or above -1 SD Osteopenia   T-score between -1 and -2.5 SD Osteoporosis T-score at or below -2.5 SD   ASSESSMENT: 65 y.o. [place] woman  (1) status post left Breast upper outer quadrant lumpectomy and sentinel lymph node sampling 1999 for a T1(mic) N0, stage IA invasive breast cancer,  (a) status post adjuvant radiation  (b) status post tamoxifen for 5 years  (2) status post left breast biopsy 07/04/2014 for a clinical T1a N0, stage IA invasive ductal carcinoma, triple positive, with an MIB-1 of 80%--the patient was on raloxifene at the time  (3) status post left mastectomy and sentinel lymph node sampling 08/06/2014 for a pT1a pN0, stage IA invasive ductal carcinoma, grade 2, with negative margins. There was insufficient tissue to repeat HER-2 testing  (4) adjuvant chemotherapy started 09/16/2014 consisting of paclitaxel weekly 12 with trastuzumab given every 3 weeks, continuing the trastuzumab alone to complete 1 year, but stopped 12/16/2014 for financial reasons  (a) completed 12 doses of paclitaxel 12/02/2014.  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  (7) tobacco abuse: The  patient has been strongly advised to quit  (a) Wellbutrin prescribed 11/11/2014, not started by patient will  (b) unable to tolerate Chantix  PLAN: Ahonesty is now a little over a year out from definitive surgery for her very early stage breast cancer with no evidence of disease activity. This is very favorable.  Her prognosis is good enough that she could have had no adjuvant treatment. She did receive paclitaxel and trastuzumab as well as about a year of anastrozole area I am comfortable with her preference of observation from this point.  She does have slightly worsening osteopenia/osteoporosis. Her insurance will not pay for week last. We discussed the possible toxicities, side effects and complications of bisphosphonates and she will be started on Fosamax. I went ahead and put in the prescription and explained to her how to take that medication to avoid problems with reflux  Otherwise she will have her next mammogram in February, we'll see her primary care physician in March, her gynecologist in June, and will return to see me next year in November.     Tanya Cruel, MD   10/13/2015 11:43 AM

## 2016-02-05 ENCOUNTER — Other Ambulatory Visit: Payer: Self-pay | Admitting: Gynecology

## 2016-02-05 DIAGNOSIS — Z853 Personal history of malignant neoplasm of breast: Secondary | ICD-10-CM

## 2016-02-05 DIAGNOSIS — Z1231 Encounter for screening mammogram for malignant neoplasm of breast: Secondary | ICD-10-CM

## 2016-02-18 ENCOUNTER — Encounter (HOSPITAL_COMMUNITY): Payer: Self-pay

## 2016-03-04 ENCOUNTER — Ambulatory Visit
Admission: RE | Admit: 2016-03-04 | Discharge: 2016-03-04 | Disposition: A | Discharge status: Home / Self Care | Payer: BLUE CROSS/BLUE SHIELD | Source: Ambulatory Visit | Attending: Gynecology | Admitting: Gynecology

## 2016-03-04 DIAGNOSIS — Z1231 Encounter for screening mammogram for malignant neoplasm of breast: Secondary | ICD-10-CM

## 2016-04-17 IMAGING — CR DG CHEST 2V
2 series · 2 of 2 positions shown · non-contrast
Comparison: July 02, 2013

CLINICAL DATA: History of left breast carcinoma. Recent chest pain.

EXAM:
CHEST  2 VIEW

[view not recorded (1 of 2)]
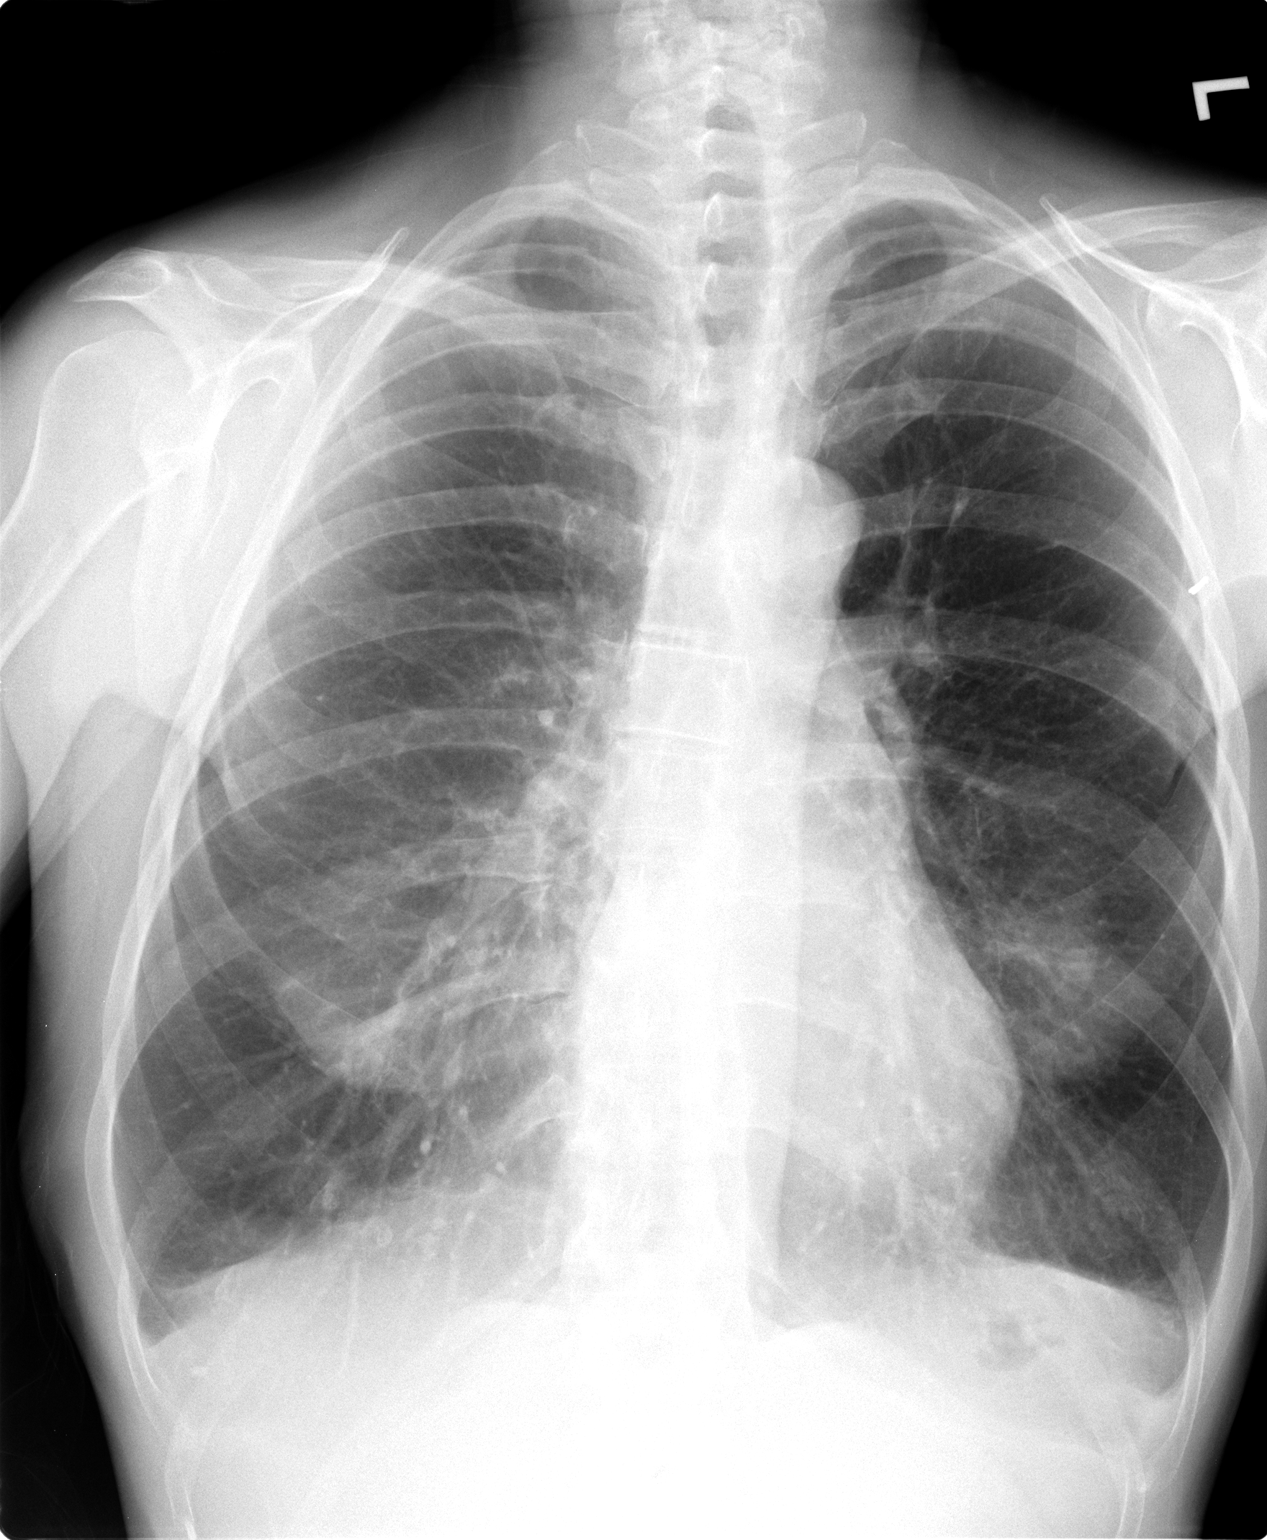

[view not recorded (2 of 2)]
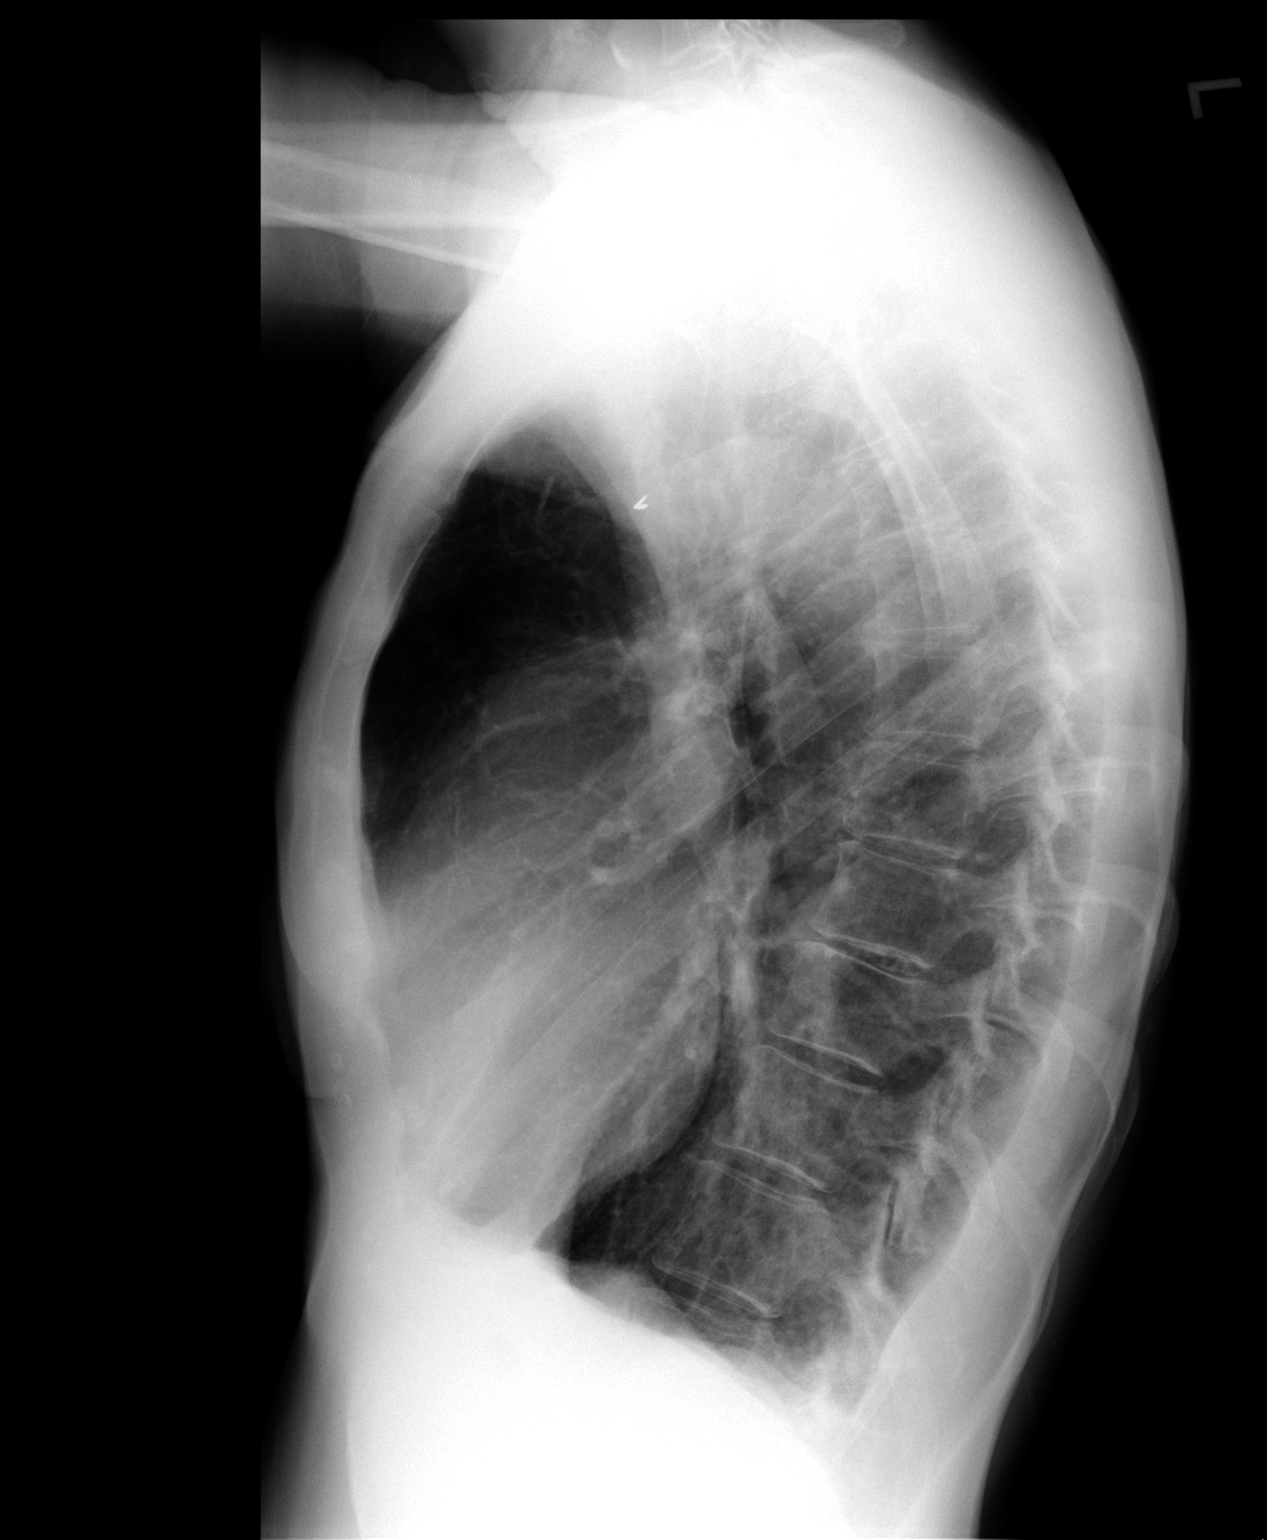

[2 of 2 positions shown; findings below may reference images not displayed]

FINDINGS: There is underlying emphysematous change. There is no edema or
consolidation. Heart size is normal. Pulmonary vascularity is within
normal limits. No adenopathy. There is evidence of previous
mastectomy on the left with surgical clips in the left axillary
region. No bone lesions. There is upper thoracic levoscoliosis.
IMPRESSION: Underlying emphysematous change. No edema or consolidation. No
demonstrable mass or adenopathy. Postoperative change on the left.

## 2016-04-25 NOTE — Progress Notes (Signed)
HPI:   [name] is a 66 y.o. female, who is here today for her routine physical.  I saw her last on 04/15/15.   Regular exercise 3 or more time per week: Walks her dog daily, not brisk walking. Following a healthy diet: not consistently. She lives with husband.  Chronic medical problems: OA,HLD,COPD,osteoporosis, and Hx of left breast cancer.  S/P left mastectomy and chemo (completed 11/2014, residual numbness). She is not longer on Arimidex.  She follows with gyn and oncologists regularly.Next appt with oncologists 10/2016.  HLD: She is on non pharmacologic treatment No Hx of CVD.   Mammogram: 03/2016 [facility] 1 Colonoscopy: 11/2007, internal hemorrhoids, no polyps. 7 years f/u was recommended. According to pt, she was told she was due 2019. DEXA: 11/2015: Osteoporosis.  Hep C screening : 06/08/13 Negative.  She has some concerns today.  Generalized joint pain, mainly elbows.She is not sure this is related to Fosamax.  Pain is moderate to severe sometimes. She has 3-4 years of Hx left hip pain, which seems stable. No recent falls or injuries. No joint edema,erythema,no limitation of ROM but it elicits pain. Alleviated by rest.   Hx of osteoporosis,has ben on Reclast but her insurance did not continue covering.She had some achy joints but lasted a couple of days. She was also on Evista in the past. She has followed with oncologists. She is on Ca++ with Vit D.  She is also concerned lung cancer screening, last year after discussion of  recommendations chest CT low density was ordered. According to pt, her health insurance did not authorize it ,she was told she was not covered because already has Hx of cancer. She denies cough,dyspnea,chest pain,or abnormal wt loss. She is still smoking and not interested in smoking cessation.  Lab Results  Component Value Date   CREATININE 0.8 12/08/2015   BUN 17.0 12/08/2015   NA 141 12/08/2015   K 4.4 12/08/2015   CL 101  04/03/2014   CO2 31 (H) 12/08/2015      Chemistry      Component Value Date/Time   NA 141 12/08/2015 1024   K 4.4 12/08/2015 1024   CL 101 04/03/2014 1059   CO2 31 (H) 12/08/2015 1024   BUN 17.0 12/08/2015 1024   CREATININE 0.8 12/08/2015 1024      Component Value Date/Time   CALCIUM 9.9 12/08/2015 1024   ALKPHOS 57 12/08/2015 1024   AST 21 12/08/2015 1024   ALT 17 12/08/2015 1024   BILITOT 0.41 12/08/2015 1024     Lab Results  Component Value Date   WBC 5.4 12/08/2015   HGB 15.0 12/08/2015   HCT 45.3 12/08/2015   MCV 100.2 12/08/2015   PLT 166 12/08/2015     Review of Systems  Constitutional: Negative for activity change, appetite change, fatigue, fever and unexpected weight change.  HENT: Negative for dental problem, hearing loss, mouth sores, sore throat, trouble swallowing and voice change.   Eyes: Negative for redness and visual disturbance.  Respiratory: Negative for cough, shortness of breath and wheezing.   Cardiovascular: Negative for chest pain, palpitations and leg swelling.  Gastrointestinal: Negative for abdominal pain, nausea and vomiting.       No changes in bowel habits.  Endocrine: Negative for cold intolerance, heat intolerance, polydipsia, polyphagia and polyuria.  Genitourinary: Negative for decreased urine volume, dysuria, hematuria, vaginal bleeding and vaginal discharge.  Musculoskeletal: Positive for arthralgias. Negative for gait problem and neck pain.  Skin: Negative  for color change and rash.  Neurological: Negative for syncope, weakness and headaches.  Hematological: Negative for adenopathy. Does not bruise/bleed easily.  Psychiatric/Behavioral: Negative for confusion and sleep disturbance. The patient is nervous/anxious.   All other systems reviewed and are negative.     Current Outpatient Prescriptions on File Prior to Visit  Medication Sig Dispense Refill  . alendronate (FOSAMAX) 70 MG tablet Take 1 tablet (70 mg total) by mouth  once a week. Take with a full glass of water on an empty stomach. 15 tablet 4  . Calcium Carbonate-Vitamin D 600-400 MG-UNIT tablet Take 1 tablet by mouth daily.    . Cholecalciferol (VITAMIN D-1000 MAX ST) 1000 UNITS tablet Take 1,000 Units by mouth.    Marland Kitchen ibuprofen (MOTRIN IB) 200 MG tablet Take 1 tablet (200 mg total) by mouth every 6 (six) hours as needed. 30 tablet 0  . Misc Natural Products (OSTEO BI-FLEX TRIPLE STRENGTH) TABS Take 1 tablet by mouth 2 (two) times daily.    . Multiple Vitamin (MULTIVITAMIN) tablet Take 1 tablet by mouth daily.     No current facility-administered medications on file prior to visit.      Past Medical History:  Diagnosis Date  . Breast cancer ([place])    Radiation and lumpectomy  . COPD, mild ([place])   . Family history of breast cancer   . Family history of uterine cancer   . Osteopenia    DEXA 10/27/11: -1.4, no change from 10/2009  . Shingles   . Smoker     Allergies  Allergen Reactions  . Codeine     REACTION: Rash  . Tetanus Toxoid     REACTION: Shock    Family History  Problem Relation Age of Onset  . CAD Mother     stent older age  . Hypertension Mother   . Breast cancer Mother 39  . Cancer Mother     SKIN and BLADDER   . Heart disease Paternal Grandfather   . Lung cancer Paternal Grandfather   . Hypertension Father   . Heart disease Father 50    CHF   . Dementia Father   . Hearing loss Maternal Grandfather   . Parkinson's disease Paternal Grandmother     Social History   Social History  . Marital status: Married    Spouse name: N/A  . Number of children: 0  . Years of education: N/A   Social History Main Topics  . Smoking status: Current Every Day Smoker    Packs/day: 1.00    Years: 30.00    Types: Cigarettes  . Smokeless tobacco: Never Used     Comment: Trying to smoke less.   . Alcohol use 8.4 oz/week    14 Standard drinks or equivalent per week     Comment: 5 DRINKS A WEEK  . Drug use: No  . Sexual activity:  No   Other Topics Concern  . None   Social History Narrative   Lives at home with husband who is disabled.       Vitals:   04/26/16 0952  BP: 118/80  Pulse: 79  Resp: 12   Body mass index is 21.35 kg/m.  O2 sat at RA 98%  Wt Readings from Last 3 Encounters:  04/26/16 126 lb 5 oz (57.3 kg)  12/08/15 121 lb 11.2 oz (55.2 kg)  10/13/15 121 lb 11.2 oz (55.2 kg)   Physical Exam  Nursing note and vitals reviewed. Constitutional: She is oriented to person, place,  and time. She appears well-developed and well-nourished. No distress.  HENT:  Head: Atraumatic.  Right Ear: Hearing, tympanic membrane, external ear and ear canal normal.  Left Ear: Hearing, tympanic membrane, external ear and ear canal normal.  Mouth/Throat: Uvula is midline, oropharynx is clear and moist and mucous membranes are normal.  Eyes: Conjunctivae and EOM are normal. Pupils are equal, round, and reactive to light.  Neck: No tracheal deviation present. No thyromegaly present.  Cardiovascular: Normal rate and regular rhythm.   No murmur heard. Pulses:      Dorsalis pedis pulses are 2+ on the right side, and 2+ on the left side.       Posterior tibial pulses are 2+ on the right side, and 2+ on the left side.  Respiratory: Effort normal and breath sounds normal. No respiratory distress.  GI: Soft. She exhibits no mass. There is no hepatomegaly. There is no tenderness.  Musculoskeletal: She exhibits no edema.  No significant limitation ROM of major joints. Left hip mild pain with full flexion,mildly limited rotation.  Knee crepitus bilateral,no pain elicited. No major deformity or sigs of synovitis appreciated.  Lymphadenopathy:    She has no cervical adenopathy.       Right: No supraclavicular adenopathy present.       Left: No supraclavicular adenopathy present.  Neurological: She is alert and oriented to person, place, and time. She has normal strength. No cranial nerve deficit. Coordination and gait  normal.  Reflex Scores:      Bicep reflexes are 2+ on the right side and 2+ on the left side.      Patellar reflexes are 2+ on the right side and 2+ on the left side. Skin: Skin is warm. No rash noted. No erythema.  Psychiatric: She has a normal mood and affect. Cognition and memory are normal.  Well groomed, good eye contact.    ASSESSMENT AND PLAN:   Philena was seen today for annual exam.  Diagnoses and all orders for this visit:  Lab Results  Component Value Date   CHOL 234 (H) 04/26/2016   HDL 81.80 04/26/2016   LDLCALC 134 (H) 04/26/2016   LDLDIRECT 127.4 03/13/2012   TRIG 91.0 04/26/2016   CHOLHDL 3 04/26/2016    Routine general medical examination at a health care facility   We discussed the importance of regular physical activity and healthy diet for prevention of chronic illness and/or complications. Preventive guidelines reviewed. Strongly recommend smoking cessation. Vaccination up to date except for zoster vaccine. Hx of zoster infection,she is not interested in new vaccine. Fall prevention. Ca++ and vit D supplementation to continue. Next CPE in 1 year.   Hyperlipidemia, unspecified hyperlipidemia type  Continue low fat diet. Further recommendations will be given according to lab results. F/U in 6-12 months.  -     Lipid panel  Localized osteoporosis without current pathological fracture  Last DEXA reviewed. Side effects of Fosamax reviewed.She can hold Fosamax for a few weeks and monitor for changes. Treatment options discussed and some side effects. She tolerated Reclast better, consider resuming it.Explained we do not have availability in this office for infusion but can be done at her oncologists' office if she decides to do so. Prolia is another good options.She would like to do some resource and will let me know. Fall precautions and regular physical activity + continue Ca++ and vit D.   Polyarthralgia  Could be aggravated by Fosamax,she  also has underlying OA. Stretching exercise,Tai Chi is a good options.  Caution with OTC NSAID's.  Chronic obstructive pulmonary disease, unspecified COPD type ([place])  Asymptomatic. Adverse effects of tobacco discussed. Guidelines for lung cancer screening discussed. Encouraged to quit smoking.    She will continue following with gyn and oncologists for breast cancer.    Return in 1 year (on 04/26/2017).     [name]. [name], MD  [facility]. [place] office.

## 2016-04-26 ENCOUNTER — Encounter: Payer: Self-pay | Admitting: Family Medicine

## 2016-04-26 ENCOUNTER — Ambulatory Visit (INDEPENDENT_AMBULATORY_CARE_PROVIDER_SITE_OTHER): Payer: BLUE CROSS/BLUE SHIELD | Admitting: Family Medicine

## 2016-04-26 VITALS — BP 118/80 | HR 79 | Resp 12 | Ht 64.5 in | Wt 126.3 lb

## 2016-04-26 DIAGNOSIS — M816 Localized osteoporosis [Lequesne]: Secondary | ICD-10-CM | POA: Diagnosis not present

## 2016-04-26 DIAGNOSIS — E785 Hyperlipidemia, unspecified: Secondary | ICD-10-CM

## 2016-04-26 DIAGNOSIS — J449 Chronic obstructive pulmonary disease, unspecified: Secondary | ICD-10-CM | POA: Diagnosis not present

## 2016-04-26 DIAGNOSIS — M2550 Pain in unspecified joint: Secondary | ICD-10-CM

## 2016-04-26 DIAGNOSIS — M810 Age-related osteoporosis without current pathological fracture: Secondary | ICD-10-CM | POA: Insufficient documentation

## 2016-04-26 DIAGNOSIS — Z0000 Encounter for general adult medical examination without abnormal findings: Secondary | ICD-10-CM

## 2016-04-26 LAB — LIPID PANEL
Cholesterol: 234 mg/dL — ABNORMAL HIGH (ref 0–200)
HDL: 81.8 mg/dL (ref >39.00)
LDL CALC: 134 mg/dL — AB (ref 0–99)
NONHDL: 152.24
Total CHOL/HDL Ratio: 3
Triglycerides: 91 mg/dL (ref 0.0–149.0)
VLDL: 18.2 mg/dL (ref 0.0–40.0)

## 2016-04-26 NOTE — Progress Notes (Signed)
Pre visit review using our clinic review tool, if applicable. No additional management support is needed unless otherwise documented below in the visit note. 

## 2016-04-26 NOTE — Patient Instructions (Addendum)
A few things to remember from today's visit:   Hyperlipidemia, unspecified hyperlipidemia type - Plan: Lipid panel  Routine general medical examination at a health care facility  Localized osteoporosis without current pathological fracture  A few tips:  -As we age balance is not as good as it was, so there is a higher risks for falls. Please remove small rugs and furniture that is "in your way" and could increase the risk of falls. Stretching exercises may help with fall prevention: Yoga and Tai Chi are some examples. Low impact exercise is better, so you are not very achy the next day.  -Sun screen and avoidance of direct sun light recommended. Caution with dehydration, if working outdoors be sure to drink enough fluids.  - Some medications are not safe as we age, increases the risk of side effects and can potentially interact with other medication you are also taken;  including some of over the counter medications. Be sure to let me know when you start a new medication even if it is a dietary/vitamin supplement.   -Healthy diet low in red meet/animal fat and sugar + regular physical activity is recommended.       Please be sure medication list is accurate. If a new problem present, please set up appointment sooner than planned today.

## 2016-04-28 ENCOUNTER — Encounter: Payer: Self-pay | Admitting: Family Medicine

## 2016-05-04 ENCOUNTER — Encounter: Payer: BLUE CROSS/BLUE SHIELD | Admitting: Family Medicine

## 2016-05-19 ENCOUNTER — Encounter: Payer: Self-pay | Admitting: Gynecology

## 2016-06-28 ENCOUNTER — Telehealth: Payer: Self-pay | Admitting: Gynecology

## 2016-06-28 ENCOUNTER — Ambulatory Visit (INDEPENDENT_AMBULATORY_CARE_PROVIDER_SITE_OTHER): Payer: BLUE CROSS/BLUE SHIELD | Admitting: Gynecology

## 2016-06-28 ENCOUNTER — Encounter: Payer: Self-pay | Admitting: Gynecology

## 2016-06-28 VITALS — BP 130/82 | Ht 64.0 in | Wt 123.0 lb

## 2016-06-28 DIAGNOSIS — M810 Age-related osteoporosis without current pathological fracture: Secondary | ICD-10-CM | POA: Diagnosis not present

## 2016-06-28 DIAGNOSIS — Z01419 Encounter for gynecological examination (general) (routine) without abnormal findings: Secondary | ICD-10-CM

## 2016-06-28 NOTE — Progress Notes (Signed)
Tanya [name] [birth year] [id number]   History:    66 y.o.  for annual gyn exam with no complaints today. Patient continues to smoke one pack a cigarettes per day after having been counseled on numerous occasions the past. She had tried Chantix in the past but did not like the side effects.Patient with breast cancer history as follows: Notes from Dr. Penny Pia medical oncologist who is seen the patient documented the following on office visit 11/11/2014: "At the age of 77, the patient underwent lumpectomy and sentinel lymph node biopsy for a left sided T1(mic) N0, stage IA invasive breast cancer. She completed whole breast irradiation and received tamoxifen for 5 years. She has continued on raloxifene.  More recently, screening mammography at the breast Center with tomography 06/19/2014 showed calcifications in the left breast, which were further evaluated with diagnostic left mammography 06/26/2014. Breast density was category C. There was a 5 mm group of heterogeneous calcifications in the outer left breast. This was biopsied 07/04/2014, and showed ([id number]) invasive and in situ ductal carcinoma, grade 2. There was a minimal amount of invasive tumor, but it did allow for prognostic panel, which showed the cancer to be estrogen receptor 90% positive, progesterone receptor 80% positive, both with strong staining intensity, with an MIB-1 of 80%, and HER-2 amplified, with a signals ratio of 8.09, the number per cell being 8.90.  The patient then was evaluated at [facility] by Dr. Elisha Headland and, given the prior history of radiation, mastectomy with repeat sentinel lymph node sampling was suggested. This was performed 08/08/2014, and showed ([id number]) invasive ductal carcinoma, grade 1, measuring 0.3 cm. Margins were negative. All 3 sentinel lymph nodes sampled were negative. Repeat prognostic panel was obtained on the DCIS only, and found the tumor to be estrogen receptor positive at 65% and  progesterone receptor positive at 75%. Patient was treated with paclitaxel and rastuzumab" patient had been on Herceptin and Arimed patient is also taking her calcium and vitamin D daily.   Bone density study in 2017 demonstrated that her lowest T score was at the left femoral neck with a value of -2.5. Her medical oncologist on her on Fosamax 70 mg every weekly which she had discontinued because of joint pains. Patient had a normal colonoscopy in 2009.    Past medical history,surgical history, family history and social history were all reviewed and documented in the EPIC chart.  Gynecologic History No LMP recorded (approximate). Patient is postmenopausal. Contraception: post menopausal status Last Pap: 2017 . Results were: normal Last mammogram: 2018 . Results were: normal  Obstetric History OB History  Gravida Para Term Preterm AB Living  3 0     2 0  SAB TAB Ectopic Multiple Live Births  1            # Outcome Date GA Lbr Len/2nd Weight Sex Delivery Anes PTL Lv  3 SAB           2 AB           1 Gravida                ROS: A ROS was performed and pertinent positives and negatives are included in the history.  GENERAL: No fevers or chills. HEENT: No change in vision, no earache, sore throat or sinus congestion. NECK: No pain or stiffness. CARDIOVASCULAR: No chest pain or pressure. No palpitations. PULMONARY: No shortness of breath, cough or wheeze. GASTROINTESTINAL: No abdominal pain, nausea, vomiting or diarrhea,  melena or bright red blood per rectum. GENITOURINARY: No urinary frequency, urgency, hesitancy or dysuria. MUSCULOSKELETAL: No joint or muscle pain, no back pain, no recent trauma. DERMATOLOGIC: No rash, no itching, no lesions. ENDOCRINE: No polyuria, polydipsia, no heat or cold intolerance. No recent change in weight. HEMATOLOGICAL: No anemia or easy bruising or bleeding. NEUROLOGIC: No headache, seizures, numbness, tingling or weakness. PSYCHIATRIC: No depression, no loss  of interest in normal activity or change in sleep pattern.     Exam: chaperone present  BP 130/82   Ht '5\' 4"'  (1.626 m)   Wt 123 lb (55.8 kg)   LMP  (Approximate)   BMI 21.11 kg/m   Body mass index is 21.11 kg/m.  General appearance : Well developed well nourished female. No acute distress HEENT: Eyes: no retinal hemorrhage or exudates,  Neck supple, trachea midline, no carotid bruits, no thyroidmegaly Lungs: Clear to auscultation, no rhonchi or wheezes, or rib retractions  Heart: Regular rate and rhythm, no murmurs or gallops Breast:Examined in sitting and supine position were symmetrical in appearance, no palpable masses or tenderness,  no skin retraction, no nipple inversion, no nipple discharge, no skin discoloration, no axillary or supraclavicular lymphadenopathy Abdomen: no palpable masses or tenderness, no rebound or guarding Extremities: no edema or skin discoloration or tenderness  Pelvic:  Bartholin, Urethra, Skene Glands: Within normal limits             Vagina: No gross lesions or discharge, Atrophic changes   Cervix: No gross lesions or discharge  Uterus  anteverted , normal size, shape and consistency, non-tender and mobile  Adnexa  Without masses or tenderness  Anus and perineum  normal   Rectovaginal  normal sphincter tone without palpated masses or tenderness             Hemoccult colonoscopy next year    Assessment/Plan:  66 y.o. female for annual exam with history of left breast cancer as described above. Patient stated she was tested for the BRCA1 and BRCA2 gene and was negative. Patient had a normal pelvic ultrasound last year. Patient reports no vaginal bleeding. Patient was counseled once again on the detrimental effects of smoking and literature information was provided. Since she cannot tolerate Fosamax we are going to make arrangements for her to receive Prolia  60 mg subcutaneous every 6 month. The following risk were discussed:  The risk and benefits  of Prolia were discussed with the patient today to include a significant risk reduction for vertebral, hip and non-vertebral fractures. Potential risk also discussed were skin advanced to include rash, pruritus, eczema and other skin reactions. The recurrence of infections and clinical studies with PROLIA, including serious infections were also discussed with the patient. In 3 year clinical trial with Prolia although six-year trial data does not show any evidence for any additional or accelerating risk of infection. Other risks that were discussed included a 1 in 1000-10,000 risk of osteonecrosis of the jaw based on current available data  Pap smear not done this year. We discussed importance of calcium vitamin D and weightbearing exercise for osteoporosis prevention.   An additional 10 minutes was spent discussing treatment options for osteoporosis as well as risk and benefits.   [name] MD, 10:33 AM 06/28/2016

## 2016-06-28 NOTE — Patient Instructions (Addendum)
Steps to Quit Smoking Smoking tobacco can be harmful to your health and can affect almost every organ in your body. Smoking puts you, and those around you, at risk for developing many serious chronic diseases. Quitting smoking is difficult, but it is one of the best things that you can do for your health. It is never too late to quit. What are the benefits of quitting smoking? When you quit smoking, you lower your risk of developing serious diseases and conditions, such as:  Lung cancer or lung disease, such as COPD.  Heart disease.  Stroke.  Heart attack.  Infertility.  Osteoporosis and bone fractures.  Additionally, symptoms such as coughing, wheezing, and shortness of breath may get better when you quit. You may also find that you get sick less often because your body is stronger at fighting off colds and infections. If you are pregnant, quitting smoking can help to reduce your chances of having a baby of low birth weight. How do I get ready to quit? When you decide to quit smoking, create a plan to make sure that you are successful. Before you quit:  Pick a date to quit. Set a date within the next two weeks to give you time to prepare.  Write down the reasons why you are quitting. Keep this list in places where you will see it often, such as on your bathroom mirror or in your car or wallet.  Identify the people, places, things, and activities that make you want to smoke (triggers) and avoid them. Make sure to take these actions: ? Throw away all cigarettes at home, at work, and in your car. ? Throw away smoking accessories, such as Scientist, research (medical). ? Clean your car and make sure to empty the ashtray. ? Clean your home, including curtains and carpets.  Tell your family, friends, and coworkers that you are quitting. Support from your loved ones can make quitting easier.  Talk with your health care provider about your options for quitting smoking.  Find out what treatment  options are covered by your health insurance.  What strategies can I use to quit smoking? Talk with your healthcare provider about different strategies to quit smoking. Some strategies include:  Quitting smoking altogether instead of gradually lessening how much you smoke over a period of time. Research shows that quitting "cold [place]" is more successful than gradually quitting.  Attending in-person counseling to help you build problem-solving skills. You are more likely to have success in quitting if you attend several counseling sessions. Even short sessions of 10 minutes can be effective.  Finding resources and support systems that can help you to quit smoking and remain smoke-free after you quit. These resources are most helpful when you use them often. They can include: ? Online chats with a Social worker. ? Telephone quitlines. ? Careers information officer. ? Support groups or group counseling. ? Text messaging programs. ? Mobile phone applications.  Taking medicines to help you quit smoking. (If you are pregnant or breastfeeding, talk with your health care provider first.) Some medicines contain nicotine and some do not. Both types of medicines help with cravings, but the medicines that include nicotine help to relieve withdrawal symptoms. Your health care provider may recommend: ? Nicotine patches, gum, or lozenges. ? Nicotine inhalers or sprays. ? Non-nicotine medicine that is taken by mouth.  Talk with your health care provider about combining strategies, such as taking medicines while you are also receiving in-person counseling. Using these two strategies together  makes you more likely to succeed in quitting than if you used either strategy on its own. If you are pregnant or breastfeeding, talk with your health care provider about finding counseling or other support strategies to quit smoking. Do not take medicine to help you quit smoking unless told to do so by your health care  provider. What things can I do to make it easier to quit? Quitting smoking might feel overwhelming at first, but there is a lot that you can do to make it easier. Take these important actions:  Reach out to your family and friends and ask that they support and encourage you during this time. Call telephone quitlines, reach out to support groups, or work with a counselor for support.  Ask people who smoke to avoid smoking around you.  Avoid places that trigger you to smoke, such as bars, parties, or smoke-break areas at work.  Spend time around people who do not smoke.  Lessen stress in your life, because stress can be a smoking trigger for some people. To lessen stress, try: ? Exercising regularly. ? Deep-breathing exercises. ? Yoga. ? Meditating. ? Performing a body scan. This involves closing your eyes, scanning your body from head to toe, and noticing which parts of your body are particularly tense. Purposefully relax the muscles in those areas.  Download or purchase mobile phone or tablet apps (applications) that can help you stick to your quit plan by providing reminders, tips, and encouragement. There are many free apps, such as QuitGuide from the [employer] Office manager for Disease Control and Prevention). You can find other support for quitting smoking (smoking cessation) through smokefree.gov and other websites.  How will I feel when I quit smoking? Within the first 24 hours of quitting smoking, you may start to feel some withdrawal symptoms. These symptoms are usually most noticeable 2-3 days after quitting, but they usually do not last beyond 2-3 weeks. Changes or symptoms that you might experience include:  Mood swings.  Restlessness, anxiety, or irritation.  Difficulty concentrating.  Dizziness.  Strong cravings for sugary foods in addition to nicotine.  Mild weight gain.  Constipation.  Nausea.  Coughing or a sore throat.  Changes in how your medicines work in your  body.  A depressed mood.  Difficulty sleeping (insomnia).  After the first 2-3 weeks of quitting, you may start to notice more positive results, such as:  Improved sense of smell and taste.  Decreased coughing and sore throat.  Slower heart rate.  Lower blood pressure.  Clearer skin.  The ability to breathe more easily.  Fewer sick days.  Quitting smoking is very challenging for most people. Do not get discouraged if you are not successful the first time. Some people need to make many attempts to quit before they achieve long-term success. Do your best to stick to your quit plan, and talk with your health care provider if you have any questions or concerns. This information is not intended to replace advice given to you by your health care provider. Make sure you discuss any questions you have with your health care provider. Document Released: 12/15/2000 Document Revised: 08/19/2015 Document Reviewed: 05/07/2014 Elsevier Interactive Patient Education  2017 [place].  Denosumab injection What is this medicine? DENOSUMAB (den oh sue mab) slows bone breakdown. Prolia is used to treat osteoporosis in women after menopause and in men. Delton See is used to treat a high calcium level due to cancer and to prevent bone fractures and other bone problems caused  by multiple myeloma or cancer bone metastases. Delton See is also used to treat giant cell tumor of the bone. This medicine may be used for other purposes; ask your health care provider or pharmacist if you have questions. COMMON BRAND NAME(S): Prolia, XGEVA What should I tell my health care provider before I take this medicine? They need to know if you have any of these conditions: -dental disease -having surgery or tooth extraction -infection -kidney disease -low levels of calcium or Vitamin D in the blood -malnutrition -on hemodialysis -skin conditions or sensitivity -thyroid or parathyroid disease -an unusual reaction to  denosumab, other medicines, foods, dyes, or preservatives -pregnant or trying to get pregnant -breast-feeding How should I use this medicine? This medicine is for injection under the skin. It is given by a health care professional in a hospital or clinic setting. If you are getting Prolia, a special MedGuide will be given to you by the pharmacist with each prescription and refill. Be sure to read this information carefully each time. For Prolia, talk to your pediatrician regarding the use of this medicine in children. Special care may be needed. For Delton See, talk to your pediatrician regarding the use of this medicine in children. While this drug may be prescribed for children as young as 13 years for selected conditions, precautions do apply. Overdosage: If you think you have taken too much of this medicine contact a poison control center or emergency room at once. NOTE: This medicine is only for you. Do not share this medicine with others. What if I miss a dose? It is important not to miss your dose. Call your doctor or health care professional if you are unable to keep an appointment. What may interact with this medicine? Do not take this medicine with any of the following medications: -other medicines containing denosumab This medicine may also interact with the following medications: -medicines that lower your chance of fighting infection -steroid medicines like prednisone or cortisone This list may not describe all possible interactions. Give your health care provider a list of all the medicines, herbs, non-prescription drugs, or dietary supplements you use. Also tell them if you smoke, drink alcohol, or use illegal drugs. Some items may interact with your medicine. What should I watch for while using this medicine? Visit your doctor or health care professional for regular checks on your progress. Your doctor or health care professional may order blood tests and other tests to see how you are  doing. Call your doctor or health care professional for advice if you get a fever, chills or sore throat, or other symptoms of a cold or flu. Do not treat yourself. This drug may decrease your body's ability to fight infection. Try to avoid being around people who are sick. You should make sure you get enough calcium and vitamin D while you are taking this medicine, unless your doctor tells you not to. Discuss the foods you eat and the vitamins you take with your health care professional. See your dentist regularly. Brush and floss your teeth as directed. Before you have any dental work done, tell your dentist you are receiving this medicine. Do not become pregnant while taking this medicine or for 5 months after stopping it. Talk with your doctor or health care professional about your birth control options while taking this medicine. Women should inform their doctor if they wish to become pregnant or think they might be pregnant. There is a potential for serious side effects to an unborn child. Talk  to your health care professional or pharmacist for more information. What side effects may I notice from receiving this medicine? Side effects that you should report to your doctor or health care professional as soon as possible: -allergic reactions like skin rash, itching or hives, swelling of the face, lips, or tongue -bone pain -breathing problems -dizziness -jaw pain, especially after dental work -redness, blistering, peeling of the skin -signs and symptoms of infection like fever or chills; cough; sore throat; pain or trouble passing urine -signs of low calcium like fast heartbeat, muscle cramps or muscle pain; pain, tingling, numbness in the hands or feet; seizures -unusual bleeding or bruising -unusually weak or tired Side effects that usually do not require medical attention (report to your doctor or health care professional if they continue or are  bothersome): -constipation -diarrhea -headache -joint pain -loss of appetite -muscle pain -runny nose -tiredness -upset stomach This list may not describe all possible side effects. Call your doctor for medical advice about side effects. You may report side effects to FDA at 1-800-FDA-1088. Where should I keep my medicine? This medicine is only given in a clinic, doctor's office, or other health care setting and will not be stored at home. NOTE: This sheet is a summary. It may not cover all possible information. If you have questions about this medicine, talk to your doctor, pharmacist, or health care provider.  2018 Elsevier/Gold Standard (2016-01-13 19:17:21)

## 2016-06-28 NOTE — Telephone Encounter (Signed)
Tanya [name], this patient with osteoporosis needs to start Prolia. Her medical oncologist had started her on Fosomax and she developed joint pains and stopped. Prior to that with another insurance compamy she was on one year only of Reclast but was too expensive

## 2016-06-30 ENCOUNTER — Other Ambulatory Visit: Payer: BLUE CROSS/BLUE SHIELD

## 2016-06-30 LAB — COMPREHENSIVE METABOLIC PANEL
ALT: 12 U/L (ref 6–29)
AST: 17 U/L (ref 10–35)
Albumin: 4.1 g/dL (ref 3.6–5.1)
Alkaline Phosphatase: 44 U/L (ref 33–130)
BUN: 17 mg/dL (ref 7–25)
CALCIUM: 9.3 mg/dL (ref 8.6–10.4)
CO2: 27 mmol/L (ref 20–31)
Chloride: 100 mmol/L (ref 98–110)
Creat: 0.68 mg/dL (ref 0.50–0.99)
Glucose, Bld: 98 mg/dL (ref 65–99)
POTASSIUM: 4.5 mmol/L (ref 3.5–5.3)
Sodium: 138 mmol/L (ref 135–146)
TOTAL PROTEIN: 6.3 g/dL (ref 6.1–8.1)
Total Bilirubin: 0.5 mg/dL (ref 0.2–1.2)

## 2016-06-30 LAB — CBC WITH DIFFERENTIAL/PLATELET
BASOS PCT: 1 %
Basophils Absolute: 50 cells/uL (ref 0–200)
EOS ABS: 100 {cells}/uL (ref 15–500)
Eosinophils Relative: 2 %
HEMATOCRIT: 45.5 % — AB (ref 35.0–45.0)
Hemoglobin: 15.4 g/dL (ref 11.7–15.5)
LYMPHS ABS: 1800 {cells}/uL (ref 850–3900)
LYMPHS PCT: 36 %
MCH: 33.8 pg — ABNORMAL HIGH (ref 27.0–33.0)
MCHC: 33.8 g/dL (ref 32.0–36.0)
MCV: 100 fL (ref 80.0–100.0)
MONO ABS: 500 {cells}/uL (ref 200–950)
MPV: 9.7 fL (ref 7.5–12.5)
Monocytes Relative: 10 %
Neutro Abs: 2550 cells/uL (ref 1500–7800)
Neutrophils Relative %: 51 %
Platelets: 183 10*3/uL (ref 140–400)
RBC: 4.55 MIL/uL (ref 3.80–5.10)
RDW: 13 % (ref 11.0–15.0)
WBC: 5 10*3/uL (ref 3.8–10.8)

## 2016-07-01 LAB — VITAMIN D 25 HYDROXY (VIT D DEFICIENCY, FRACTURES): Vit D, 25-Hydroxy: 57 ng/mL (ref 30–100)

## 2016-07-19 ENCOUNTER — Encounter: Payer: Self-pay | Admitting: Gynecology

## 2016-07-22 NOTE — Telephone Encounter (Signed)
I called the patient and she stopped by with information needed for a letter for medical necessity for the blood test Vit D for DOS 06/30/16.  Letter written, signed by Dr Toney Rakes and faxed to [facility].  Copied mailed to pt. KW CMA

## 2016-07-26 NOTE — Telephone Encounter (Signed)
PC to pt SOB from [place] is not here, I will call with benefits for Prolia, Pt had numerous questions due to information online. I discussed with her side effects that Dr Moshe Salisbury had also told her. She is not having any dental work but she is going to talk with her dentist also.

## 2016-08-02 NOTE — Telephone Encounter (Signed)
Insurance No PA, No Deductible, Co pay $40 with OV, 100% coverage no OV nurse only, OOPM $[id number]  ($174 met). Calcium 9.3  06/30/16 , Complete JF 06/28/16, LM on VM for pt to call and schedule Prolia injection.

## 2016-08-03 NOTE — Telephone Encounter (Signed)
PC to insurance company talked with LYN after 45 min she determined that NO PA is required for Prolia. She then had me call [facility] for oncology ok due to pt participation . I was also told NO PA. Pt called this afternoon and she stated that she does not want Prolia due to side effect information.

## 2016-08-06 ENCOUNTER — Encounter: Payer: Self-pay | Admitting: Anesthesiology

## 2016-09-15 ENCOUNTER — Ambulatory Visit (INDEPENDENT_AMBULATORY_CARE_PROVIDER_SITE_OTHER): Payer: BLUE CROSS/BLUE SHIELD

## 2016-09-15 DIAGNOSIS — Z23 Encounter for immunization: Secondary | ICD-10-CM

## 2016-09-23 ENCOUNTER — Encounter: Payer: Self-pay | Admitting: Family Medicine

## 2016-11-08 ENCOUNTER — Other Ambulatory Visit: Payer: Self-pay | Admitting: *Deleted

## 2016-11-08 ENCOUNTER — Other Ambulatory Visit (HOSPITAL_BASED_OUTPATIENT_CLINIC_OR_DEPARTMENT_OTHER): Payer: BLUE CROSS/BLUE SHIELD

## 2016-11-08 DIAGNOSIS — C50919 Malignant neoplasm of unspecified site of unspecified female breast: Secondary | ICD-10-CM

## 2016-11-08 LAB — CBC WITH DIFFERENTIAL/PLATELET
BASO%: 0.7 % (ref 0.0–2.0)
BASOS ABS: 0 10*3/uL (ref 0.0–0.1)
EOS ABS: 0 10*3/uL (ref 0.0–0.5)
EOS%: 0.8 % (ref 0.0–7.0)
HEMATOCRIT: 45.4 % (ref 34.8–46.6)
HEMOGLOBIN: 15.5 g/dL (ref 11.6–15.9)
LYMPH#: 1.7 10*3/uL (ref 0.9–3.3)
LYMPH%: 33.6 % (ref 14.0–49.7)
MCH: 33.8 pg (ref 25.1–34.0)
MCHC: 34.1 g/dL (ref 31.5–36.0)
MCV: 99.1 fL (ref 79.5–101.0)
MONO#: 0.5 10*3/uL (ref 0.1–0.9)
MONO%: 8.8 % (ref 0.0–14.0)
NEUT%: 56.1 % (ref 38.4–76.8)
NEUTROS ABS: 2.9 10*3/uL (ref 1.5–6.5)
PLATELETS: 176 10*3/uL (ref 145–400)
RBC: 4.58 10*6/uL (ref 3.70–5.45)
RDW: 13.2 % (ref 11.2–14.5)
WBC: 5.1 10*3/uL (ref 3.9–10.3)

## 2016-11-08 LAB — COMPREHENSIVE METABOLIC PANEL
ALT: 14 U/L (ref 0–55)
ANION GAP: 8 meq/L (ref 3–11)
AST: 20 U/L (ref 5–34)
Albumin: 4.3 g/dL (ref 3.5–5.0)
Alkaline Phosphatase: 48 U/L (ref 40–150)
BUN: 15.1 mg/dL (ref 7.0–26.0)
CHLORIDE: 100 meq/L (ref 98–109)
CO2: 31 meq/L — AB (ref 22–29)
CREATININE: 0.8 mg/dL (ref 0.6–1.1)
Calcium: 10 mg/dL (ref 8.4–10.4)
Glucose: 82 mg/dl (ref 70–140)
POTASSIUM: 4.7 meq/L (ref 3.5–5.1)
Sodium: 138 mEq/L (ref 136–145)
Total Bilirubin: 0.52 mg/dL (ref 0.20–1.20)
Total Protein: 7.4 g/dL (ref 6.4–8.3)

## 2016-11-12 NOTE — Progress Notes (Signed)
[facility]  [phone] Fax:([phone]) [phone]     ID: Tanya [name] DOB: Nov 06, 1950  MR#: [id number]  CSN#: [id number]  Patient Care Team: [name], Tanya G, MD as PCP - General (Family Medicine) Tanya Dragon, MD (Inactive) (Gastroenterology) Tanya Mass, MD (Obstetrics and Gynecology) Tanya [name], Tanya Dad, MD as Consulting Physician (Oncology) [name], MD as Referring Physician (Surgery) [name], MD as Consulting Physician (Orthopedic Surgery)  OTHER MD: [name] MD, [name] MD  CHIEF COMPLAINT: Estrogen receptor positive breast cancer  CURRENT TREATMENT: Observation   BREAST CANCER HISTORY: From the original intake note:  At the age of 89, the patient underwent lumpectomy and sentinel lymph node biopsy for a left sided T1(mic) N0, stage IA invasive breast cancer. She completed whole breast irradiation and received tamoxifen for 5 years. She has continued on raloxifene.  More recently, screening mammography at the breast Center with tomography 06/19/2014 showed calcifications in the left breast, which were further evaluated with diagnostic left mammography 06/26/2014. Breast density was category C. There was a 5 mm group of heterogeneous calcifications in the outer left breast. This was biopsied 07/04/2014, and showed ([id number]) invasive and in situ ductal carcinoma, grade 2. There was a minimal amount of invasive tumor, but it did allow for prognostic panel, which showed the cancer to be estrogen receptor 90% positive, progesterone receptor 80% positive, both with strong staining intensity, with an MIB-1 of 80%, and HER-2 amplified, with a signals ratio of 8.09, the number per cell being 8.90.  The patient then was evaluated at [facility] by Dr. Elisha Headland and, given the prior history of radiation, mastectomy with repeat sentinel lymph node sampling was suggested. This was performed 08/08/2014, and showed ([id number]) invasive ductal carcinoma, grade 1, measuring 0.3 cm. Margins were negative. All 3 sentinel lymph nodes sampled were negative. Repeat prognostic panel was obtained on the DCIS only, and found the tumor to be estrogen receptor positive at 65% and progesterone receptor positive at 75%.  The patient's subsequent history is as detailed below.   INTERVAL HISTORY: (zometa December?)  Tanya [name] returns today for follow-up of her estrogen receptor positive breast cancer.  She continues on observation alone.  At the last visit she was started on alendronate.  REVIEW OF SYSTEMS: Tanya [name] reports she is doing well and is staying busy with yard work, house work, reading and visiting with friends. She also walks her dog once a day for about 20 minutes. She currently does not belong to a gym, but has been thinking about joining silver sneakers once she turns 67. She endorses left hip and knee pain and adds that she has been told she has arthritis in her knee. She is hoping to get a hip replacement in the future. She denies unusual headaches, visual changes, nausea, vomiting, or dizziness. There has been no unusual cough, phlegm production, or pleurisy. This been no change in bowel or bladder habits. She denies unexplained fatigue or unexplained weight loss, bleeding, rash, or fever. A detailed review of systems was otherwise entirely stable.   PAST MEDICAL HISTORY: Past Medical History:  Diagnosis Date  . Breast cancer ([place])    Radiation and lumpectomy  . COPD, mild ([place])   . Family history of breast cancer   . Family history of uterine cancer   . Osteopenia    DEXA 10/27/11: -1.4, no change from 10/2009  . Shingles   . Smoker     PAST SURGICAL HISTORY: Past Surgical  History:  Procedure Laterality Date  . BREAST BIOPSY    . BREAST LUMPECTOMY    . DILATION AND EVACUATION    . MASTECTOMY Left AUG 2016  . TONSILLECTOMY AND ADENOIDECTOMY      FAMILY HISTORY Family History  Problem  Relation Age of Onset  . CAD Mother        stent older age  . Hypertension Mother   . Breast cancer Mother 29  . Cancer Mother        SKIN and BLADDER   . Heart disease Paternal Grandfather   . Lung cancer Paternal Grandfather   . Hypertension Father   . Heart disease Father 23       CHF   . Dementia Father   . Hearing loss Maternal Grandfather   . Parkinson's disease Paternal Grandmother   The patient's father died with Alzheimer's disease at age 28. The patient's mother was diagnosed with breast cancer at age 17. She died from C. difficile colitis at age 64. The patient has one brother, one sister. There is no history of breast or ovarian cancer in the immediate family.  GYNECOLOGIC HISTORY:  No LMP recorded. Patient is postmenopausal. Menarche age 31, the patient is GX P0. She underwent menopause approximately the year 2000. She did not take hormone replacement. 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 Her husband, Tanya [name], used to be in Press photographer (Set designer) but now has had 2 back fusions and is disabled. At home is just the 2 of them plus her dog Tanya [name]   ADVANCED DIRECTIVES: In place   HEALTH MAINTENANCE: Social History   Tobacco Use  . Smoking status: Current Every Day Smoker    Packs/day: 1.00    Years: 30.00    Pack years: 30.00    Types: Cigarettes  . Smokeless tobacco: Never Used  . Tobacco comment: Trying to smoke less.   Substance Use Topics  . Alcohol use: Yes    Alcohol/week: 8.4 oz    Types: 14 Standard drinks or equivalent per week    Comment: 5 DRINKS A WEEK  . Drug use: No     Colonoscopy: November 2009  PAP:  Bone density: November 2017: -2.5  Lipid panel:  Allergies  Allergen Reactions  . Codeine     REACTION: Rash  . Tetanus Toxoid     REACTION: Shock   Current Outpatient Medications  Medication Sig Dispense  Refill  . Calcium Carbonate-Vitamin D 600-400 MG-UNIT tablet Take 1 tablet by mouth daily.    . Cholecalciferol (VITAMIN D-1000 Tanya [name] ST) 1000 UNITS tablet Take 1,000 Units by mouth.    Marland Kitchen ibuprofen (MOTRIN IB) 200 MG tablet Take 1 tablet (200 mg total) by mouth every 6 (six) hours as needed. 30 tablet 0  . Misc Natural Products (OSTEO BI-FLEX TRIPLE STRENGTH) TABS Take 1 tablet by mouth 2 (two) times daily.    . Multiple Vitamin (MULTIVITAMIN) tablet Take 1 tablet by mouth daily.     No current facility-administered medications for this visit.     OBJECTIVE: Middle-aged white woman who appears stated age  15:   11/15/16 1125  BP: 116/62  Pulse: 81  Resp: 20  Temp: 98.3 F (36.8 C)  TempSrc: Oral  SpO2: 99%  Weight: 124 lb 14.4 oz (56.7 kg)  Height: _0  (1.626 m)     ECOG FS:1 -  Symptomatic but completely ambulatory  Sclerae unicteric, pupils round and equal Oropharynx clear and moist No cervical or supraclavicular adenopathy Lungs no rales or rhonchi Heart regular rate and rhythm Abd soft, nontender, positive bowel sounds MSK no focal spinal tenderness, no upper extremity lymphedema Neuro: nonfocal, well oriented, appropriate affect Breasts: The right breast is benign.  The left breast is undergone mastectomy, with no evidence of chest wall recurrence.  Both axillae are benign.  LAB RESULTS: CBC    Component Value Date/Time   WBC 5.1 11/08/2016 1131   WBC 5.0 06/30/2016 0846   RBC 4.58 11/08/2016 1131   RBC 4.55 06/30/2016 0846   HGB 15.5 11/08/2016 1131   HCT 45.4 11/08/2016 1131   PLT 176 11/08/2016 1131   MCV 99.1 11/08/2016 1131   MCH 33.8 11/08/2016 1131   MCH 33.8 (H) 06/30/2016 0846   MCHC 34.1 11/08/2016 1131   MCHC 33.8 06/30/2016 0846   RDW 13.2 11/08/2016 1131   LYMPHSABS 1.7 11/08/2016 1131   MONOABS 0.5 11/08/2016 1131   EOSABS 0.0 11/08/2016 1131   BASOSABS 0.0 11/08/2016 1131   CMP Latest Ref Rng & Units 11/08/2016 06/30/2016 12/08/2015    Glucose 70 - 140 mg/dl 82 98 67(L)  BUN 7.0 - 26.0 mg/dL 15.1 17 17.0  Creatinine 0.6 - 1.1 mg/dL 0.8 0.68 0.8  Sodium 136 - 145 mEq/L 138 138 141  Potassium 3.5 - 5.1 mEq/L 4.7 4.5 4.4  Chloride 98 - 110 mmol/L - 100 -  CO2 22 - 29 mEq/L 31(H) 27 31(H)  Calcium 8.4 - 10.4 mg/dL 10.0 9.3 9.9  Total Protein 6.4 - 8.3 g/dL 7.4 6.3 7.2  Total Bilirubin 0.20 - 1.20 mg/dL 0.52 0.5 0.41  Alkaline Phos 40 - 150 U/L 48 44 57  AST 5 - 34 U/L _0 ALT 0 - 55 U/L _1 No results found for: LABCA2  No components found for: LABCA125  No results for input(s): INR in the last 168 hours.  Urinalysis    Component Value Date/Time   COLORURINE YELLOW 04/03/2014 1059   APPEARANCEUR CLEAR 04/03/2014 1059   LABSPEC <=1.005 (A) 04/03/2014 1059   PHURINE 7.0 04/03/2014 1059   GLUCOSEU NEGATIVE 04/03/2014 1059   HGBUR TRACE-INTACT (A) 04/03/2014 1059   BILIRUBINUR NEGATIVE 04/03/2014 1059   KETONESUR NEGATIVE 04/03/2014 1059   PROTEINUR NEG 06/08/2013 1016   UROBILINOGEN 0.2 04/03/2014 1059   NITRITE NEGATIVE 04/03/2014 1059   LEUKOCYTESUR NEGATIVE 04/03/2014 1059    STUDIES:- Digital Unilateral Right Breast Mammogram with TOMO, 03/04/2016, breast density category C, shows no evidence of malignancy.  ASSESSMENT: 66 y.o.  woman  (1) status post left Breast upper outer quadrant lumpectomy and sentinel lymph node sampling 1999 for a T1(mic) N0, stage IA invasive breast cancer,  (a) status post adjuvant radiation  (b) status post tamoxifen for 5 years  (2) status post left breast biopsy 07/04/2014 for a clinical T1a N0, stage IA invasive ductal carcinoma, triple positive, with an MIB-1 of 80%--the patient was on raloxifene at the time  (3) status post left mastectomy and sentinel lymph node sampling 08/06/2014 for a pT1a pN0, stage IA invasive ductal carcinoma, grade 2, with negative margins. There was insufficient tissue to repeat HER-2 testing  (4) adjuvant chemotherapy  started 09/16/2014 consisting of paclitaxel weekly 12 with trastuzumab given every 3 weeks, continuing the trastuzumab alone to complete 1 year, but stopped 12/16/2014 for financial reasons  (a) completed 12 doses of paclitaxel  12/02/2014.  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  (7) tobacco abuse: The patient has been strongly advised to quit  (a) Wellbutrin prescribed 11/11/2014, not started by patient will  (b) unable to tolerate Chantix  PLAN: Anaijah is now a little over 2 years out from definitive surgery for her breast cancer with no evidence of disease recurrence.  This is very favorable.  She is having significant arthritis problems and in fact will probably need a hip replacement next year.  She thinks Fosamax may be contributing to this but I doubt that that is the case.  I suggest that she simply stop the Fosamax for a month.  If she does not find she is any better then she will resume it.  She has discussed Prolia with her gynecologist, but decided against it because of potential side effects  I do think Lariah would enjoy our tai chi class and I gave her information regarding that.   She knows to call for any problems that may develop before her next visit.  Chrishon Martino, Tanya Dad, MD  11/15/16 11:38 AM Medical Oncology and Hematology [facility] [address] [place], [place] [address] Tel. [phone]    Fax. [phone]  This document serves as a record of services personally performed by Chauncey Cruel, MD. It was created on his behalf by Margit Banda, a trained medical scribe. The creation of this record is based on the scribe's personal observations and the provider's statements to them.   I have reviewed the above documentation for accuracy and completeness, and I agree with the above.

## 2016-11-15 ENCOUNTER — Ambulatory Visit: Payer: BLUE CROSS/BLUE SHIELD | Admitting: Oncology

## 2016-11-15 ENCOUNTER — Telehealth: Payer: Self-pay | Admitting: Oncology

## 2016-11-15 VITALS — BP 116/62 | HR 81 | Temp 98.3°F | Resp 20 | Ht 64.0 in | Wt 124.9 lb

## 2016-11-15 DIAGNOSIS — Z170 Estrogen receptor positive status [ER+]: Secondary | ICD-10-CM | POA: Diagnosis not present

## 2016-11-15 DIAGNOSIS — C50412 Malignant neoplasm of upper-outer quadrant of left female breast: Secondary | ICD-10-CM | POA: Diagnosis not present

## 2016-11-15 NOTE — Telephone Encounter (Signed)
Scheduled appt per 11/12 los. Patient did not want avs or calendar.

## 2016-12-04 ENCOUNTER — Other Ambulatory Visit: Payer: Self-pay | Admitting: Nurse Practitioner

## 2017-02-23 DIAGNOSIS — L814 Other melanin hyperpigmentation: Secondary | ICD-10-CM | POA: Diagnosis not present

## 2017-02-23 DIAGNOSIS — D2261 Melanocytic nevi of right upper limb, including shoulder: Secondary | ICD-10-CM | POA: Diagnosis not present

## 2017-02-23 DIAGNOSIS — Z23 Encounter for immunization: Secondary | ICD-10-CM | POA: Diagnosis not present

## 2017-02-23 DIAGNOSIS — Z86018 Personal history of other benign neoplasm: Secondary | ICD-10-CM | POA: Diagnosis not present

## 2017-02-23 DIAGNOSIS — L821 Other seborrheic keratosis: Secondary | ICD-10-CM | POA: Diagnosis not present

## 2017-02-23 DIAGNOSIS — D1801 Hemangioma of skin and subcutaneous tissue: Secondary | ICD-10-CM | POA: Diagnosis not present

## 2017-02-23 DIAGNOSIS — D225 Melanocytic nevi of trunk: Secondary | ICD-10-CM | POA: Diagnosis not present

## 2017-02-23 DIAGNOSIS — L570 Actinic keratosis: Secondary | ICD-10-CM | POA: Diagnosis not present

## 2017-03-14 ENCOUNTER — Other Ambulatory Visit: Payer: Self-pay | Admitting: Obstetrics & Gynecology

## 2017-03-14 DIAGNOSIS — Z1231 Encounter for screening mammogram for malignant neoplasm of breast: Secondary | ICD-10-CM

## 2017-03-15 ENCOUNTER — Ambulatory Visit
Admission: RE | Admit: 2017-03-15 | Discharge: 2017-03-15 | Disposition: A | Discharge status: Home / Self Care | Payer: PPO | Source: Ambulatory Visit | Attending: Obstetrics & Gynecology | Admitting: Obstetrics & Gynecology

## 2017-03-15 DIAGNOSIS — Z1231 Encounter for screening mammogram for malignant neoplasm of breast: Secondary | ICD-10-CM

## 2017-04-21 IMAGING — DX DG HIP (WITH OR WITHOUT PELVIS) 1V*L*
3 series · 3 of 3 positions shown · non-contrast
Comparison: None.

CLINICAL DATA: Patient reports LEFT-sided hip pain for 2 years. Now
increasing.

EXAM:
DG HIP (WITH OR WITHOUT PELVIS) 1V*L*

[hip ap (1 of 2)]
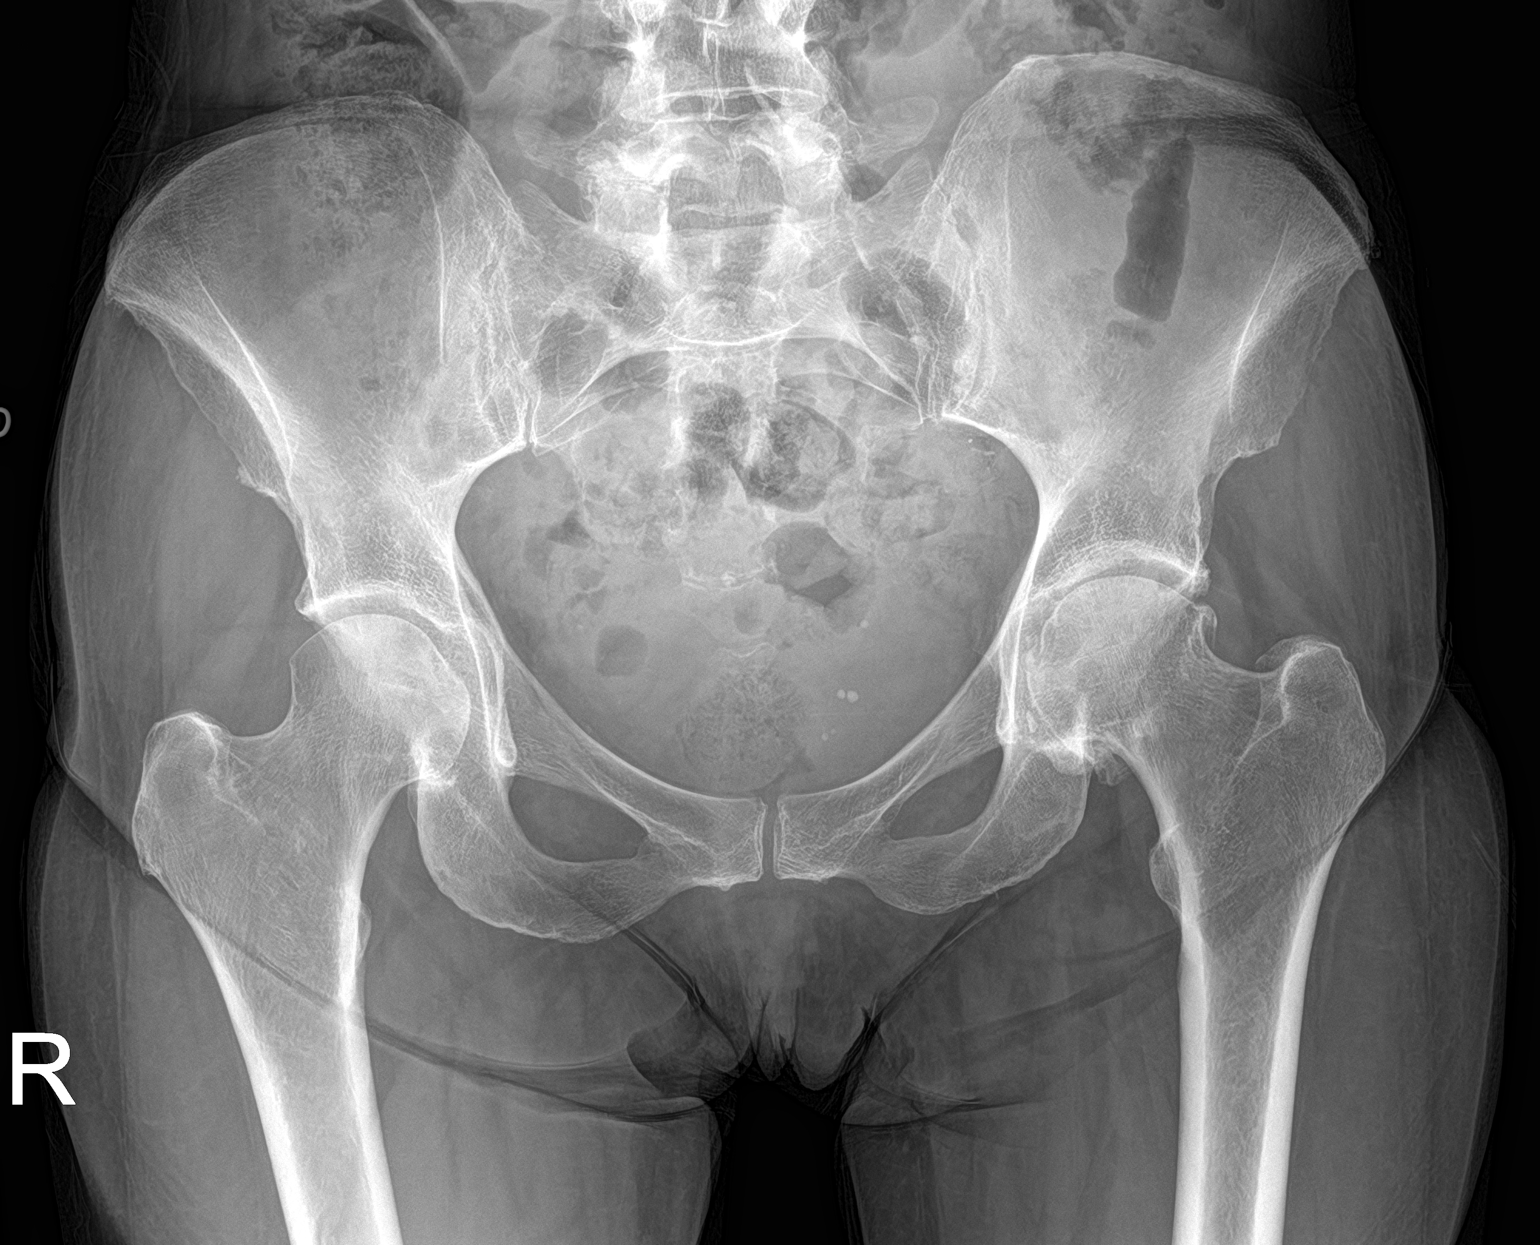

[hip lat]
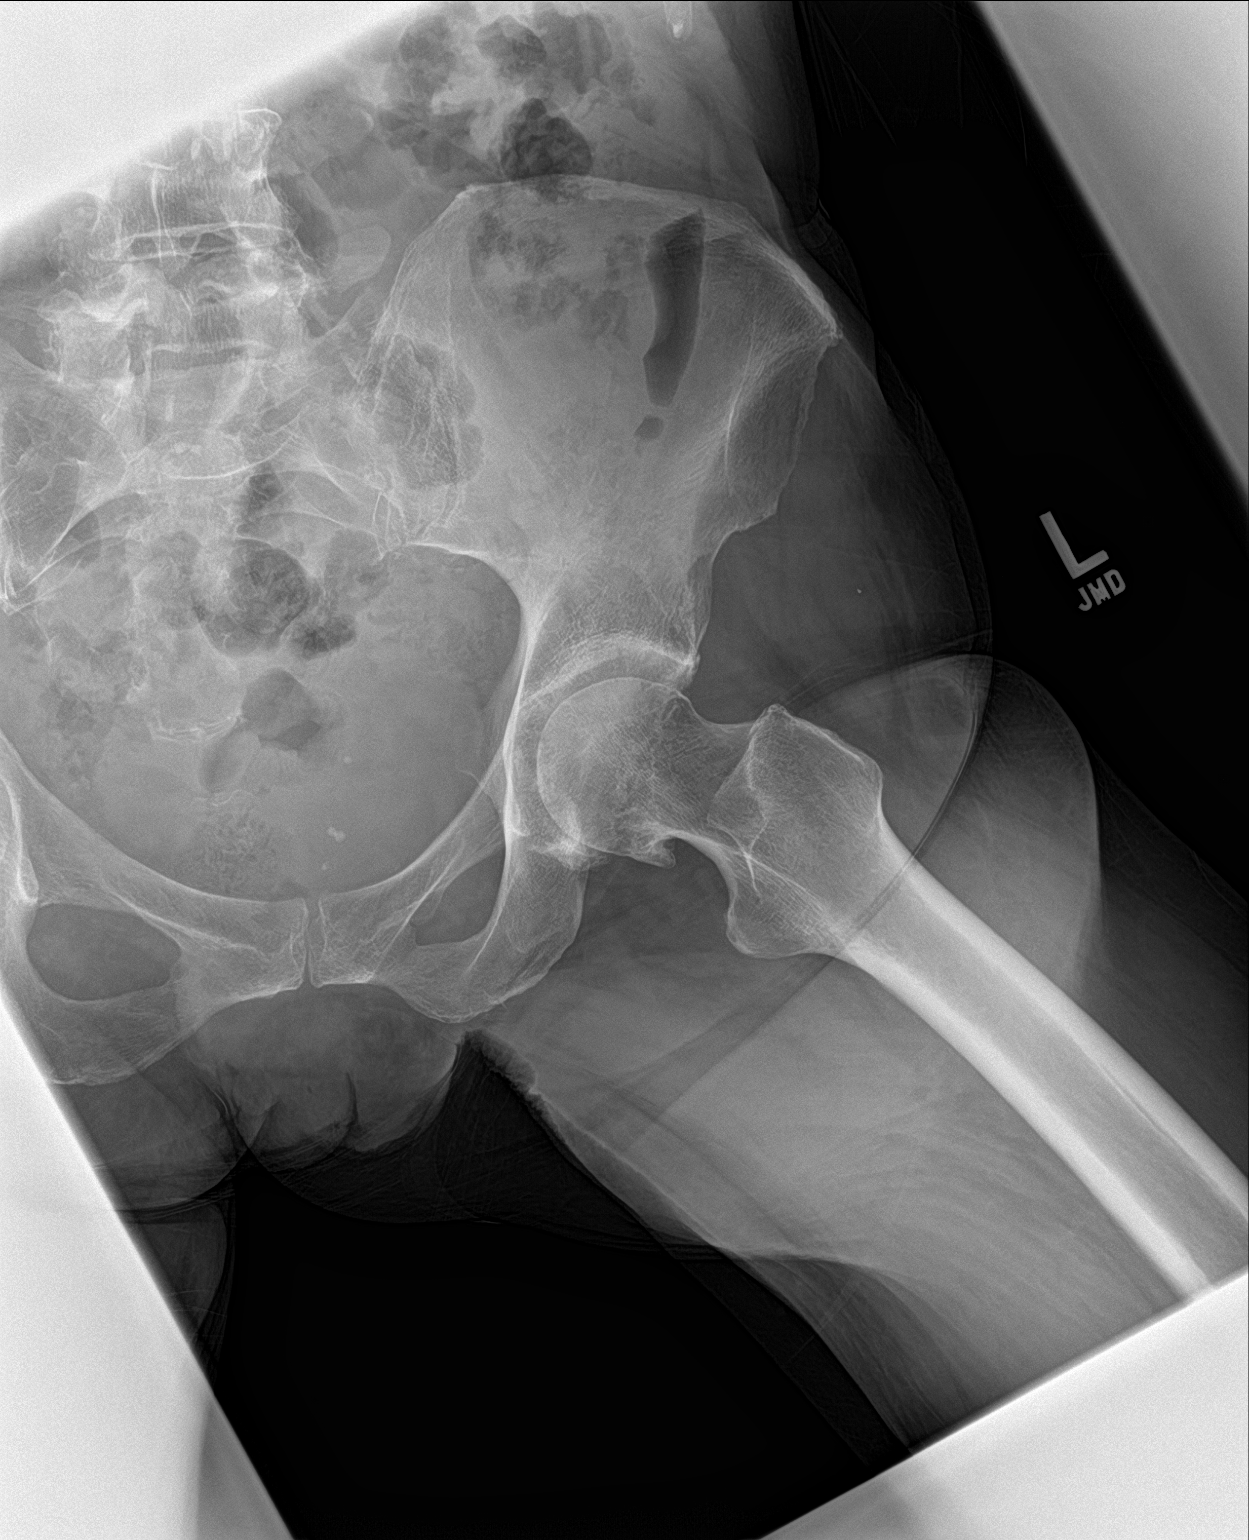

[hip ap (2 of 2)]
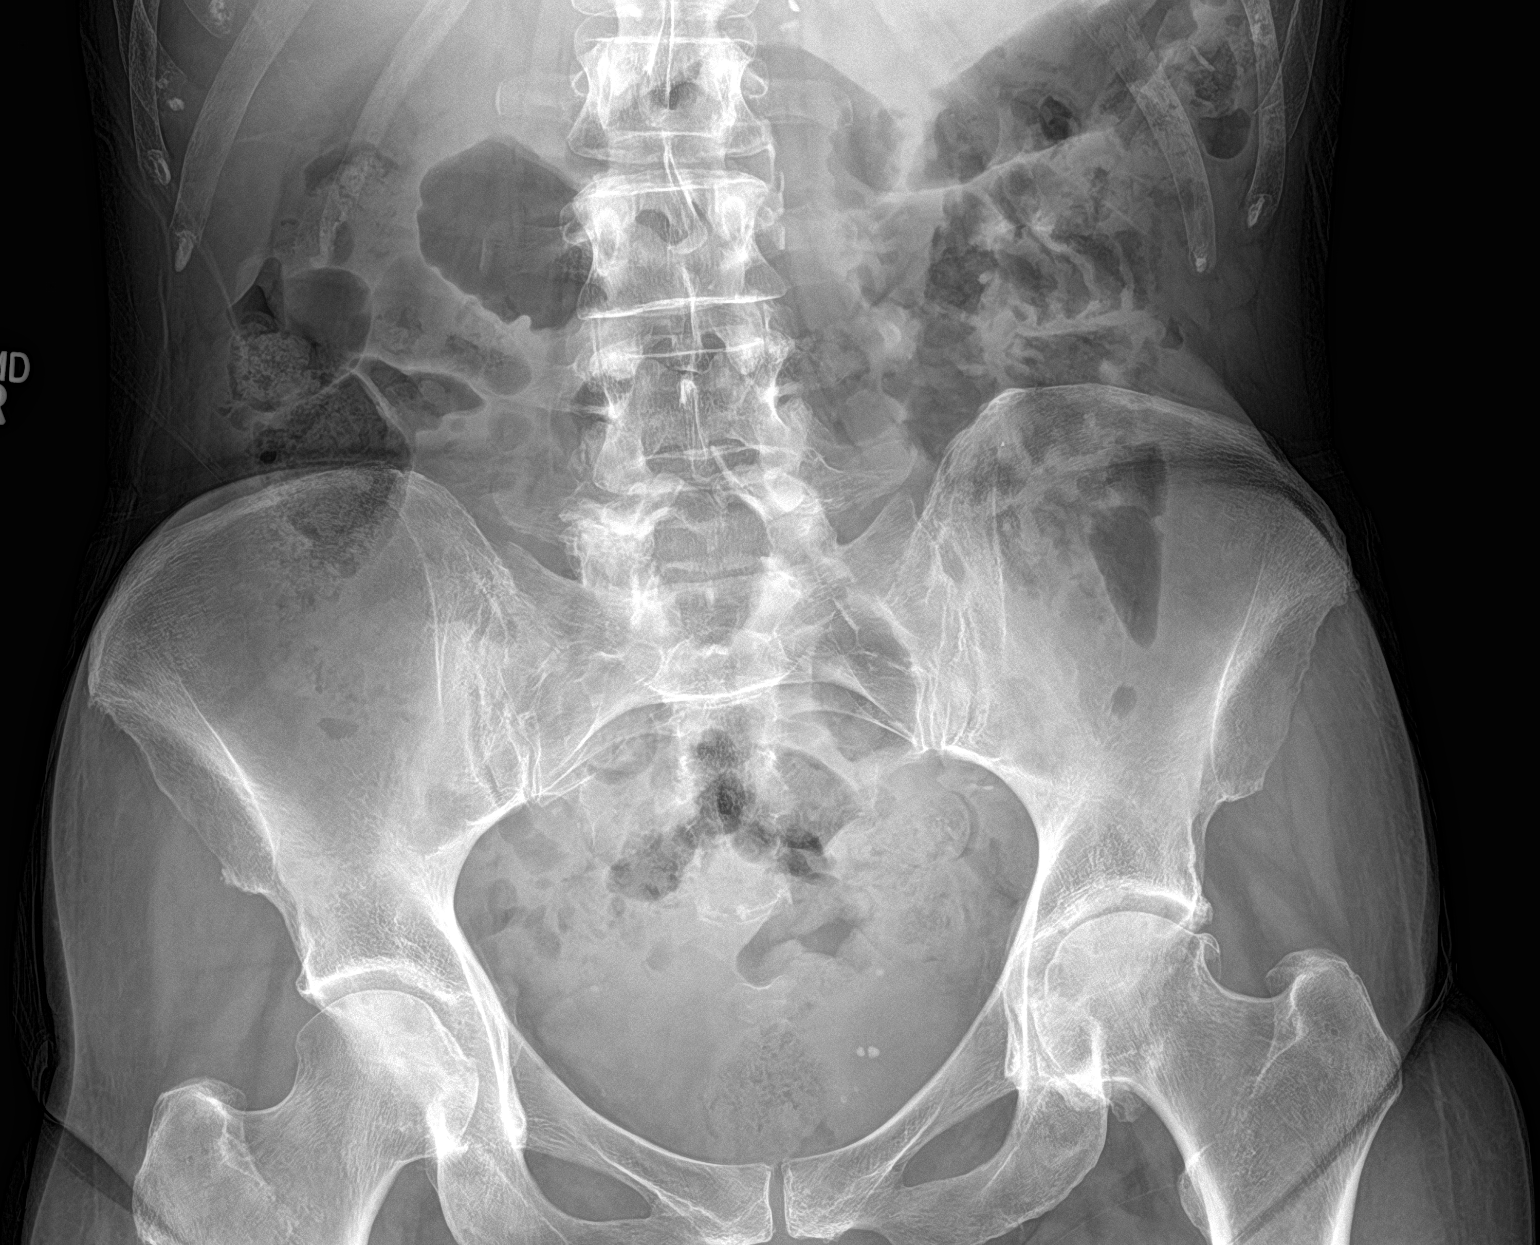

[3 of 3 positions shown; findings below may reference images not displayed]

FINDINGS: There is no evidence of hip fracture or dislocation. There is no
evidence of erosive arthropathy or other concerning bone
abnormality. Joint space narrowing is present bilaterally, greater
on the LEFT. Osseous spurring is seen from the femoral head and from
the acetabulum. Moderate stool burden. Lumbar degenerative change.
Phleboliths.
IMPRESSION: No hip fracture or dislocation.  Degenerative joint disease.

## 2017-04-28 ENCOUNTER — Encounter: Payer: Self-pay | Admitting: Family Medicine

## 2017-04-29 ENCOUNTER — Encounter: Payer: BLUE CROSS/BLUE SHIELD | Admitting: Family Medicine

## 2017-05-01 NOTE — Progress Notes (Signed)
HPI:   [name] is a 67 y.o. female, who is here today for her welcome to Medicare visit.  Last CPE: 04/26/16  Regular exercise 3 or more time per week: Walking her dog daily and yard work. Following a healthy diet: Yes.  She lives with her husband.  Chronic medical problems: Osteoporosis, left breast cancer s/p lumpectomy and mastectomy in 2016, chemotherapy-induced neuropathy, COPD,HLD,OA (left hip mainly). She has seen ortho and Cortisone shot given in left hip,last one a year ago.She states that hip replacement was recommended but she wants to continue intra articular injections because they have helped so far.   Independent ADL's and IADL's. No falls in the past year and denies depression symptoms. She wears reading glasses.  Functional Status Survey: Is the patient deaf or have difficulty hearing?: No Does the patient have difficulty seeing, even when wearing glasses/contacts?: No Does the patient have difficulty concentrating, remembering, or making decisions?: No Does the patient have difficulty walking or climbing stairs?: No Does the patient have difficulty dressing or bathing?: No Does the patient have difficulty doing errands alone such as visiting a doctor's office or shopping?: No  Fall Risk  05/02/2017 04/26/2016 11/19/2013  Falls in the past year? No No No     Providers she sees regularly:  Eye care provider: Dr Katy Fitch Dr Toney Rakes, gyn Dr Jana Hakim, oncologist. Dr Delbert Phenix ([place]), follows annually. Dr Rhona Raider, ortho prn.   Depression screen [facility] 2/9 05/02/2017  Decreased Interest 0  Down, Depressed, Hopeless 0  PHQ - 2 Score 0    Mini-Cog - 05/02/17 0924    Normal clock drawing test?  yes    How many words correct?  3       Hearing Screening   125Hz  250Hz  500Hz  1000Hz  2000Hz  3000Hz  4000Hz  6000Hz  8000Hz   Right ear:  Pass Pass Pass Pass  Pass    Left ear:  Pass Pass Pass Pass  Pass      Visual Acuity Screening   Right eye Left eye Both eyes  Without correction:     With correction: 20/20 20/20 20/20     Pap smear 2017 negative   Immunization History  Administered Date(s) Administered  . Influenza Split 10/05/2011  . Influenza Whole 09/04/2008  . Influenza,inj,Quad PF,6+ Mos 10/10/2012, 09/18/2013, 12/16/2014, 10/13/2015, 09/15/2016  . Pneumococcal Conjugate-13 05/02/2017  . Pneumococcal Polysaccharide-23 11/04/2008  . Zoster 03/17/2012    Mammogram: 03/2017 Colonoscopy: 11/2007 DEXA: 11/2015,osteoporosis. Fosamax was aggravating joint pain.  She is not interested in further treatment.  Smoker, has smoked since age 89, in average 1 PPD. Hep C screening : 06/2013 NR    HLD: Currently she is on nonpharmacologic treatment.  Lab Results  Component Value Date   CHOL 234 (H) 04/26/2016   HDL 81.80 04/26/2016   LDLCALC 134 (H) 04/26/2016   LDLDIRECT 127.4 03/13/2012   TRIG 91.0 04/26/2016   CHOLHDL 3 04/26/2016    She has no concerns today.  Review of Systems  Constitutional: Negative for appetite change, fatigue and fever.  HENT: Negative for dental problem, hearing loss, mouth sores, sore throat, trouble swallowing and voice change.   Eyes: Negative for redness and visual disturbance.  Respiratory: Negative for cough, shortness of breath and wheezing.   Cardiovascular: Negative for chest pain and leg swelling.  Gastrointestinal: Negative for abdominal pain, blood in stool, nausea and vomiting.       No changes in bowel habits.  Endocrine: Negative for cold intolerance, heat intolerance, polydipsia, polyphagia  and polyuria.  Genitourinary: Negative for decreased urine volume, dysuria, hematuria, vaginal bleeding and vaginal discharge.  Musculoskeletal: Positive for arthralgias. Negative for gait problem and myalgias.  Skin: Negative for color change and rash.  Allergic/Immunologic: Negative for environmental allergies.  Neurological: Negative for syncope, weakness and headaches.    Psychiatric/Behavioral: Negative for confusion and sleep disturbance. The patient is not nervous/anxious.   All other systems reviewed and are negative.     Current Outpatient Medications on File Prior to Visit  Medication Sig Dispense Refill  . Calcium Carbonate-Vitamin D 600-400 MG-UNIT tablet Take 1 tablet by mouth daily.    . Cholecalciferol (VITAMIN D-1000 MAX ST) 1000 UNITS tablet Take 1,000 Units by mouth.    Marland Kitchen ibuprofen (MOTRIN IB) 200 MG tablet Take 1 tablet (200 mg total) by mouth every 6 (six) hours as needed. 30 tablet 0  . Misc Natural Products (OSTEO BI-FLEX TRIPLE STRENGTH) TABS Take 1 tablet by mouth 2 (two) times daily.    . Multiple Vitamin (MULTIVITAMIN) tablet Take 1 tablet by mouth daily.     No current facility-administered medications on file prior to visit.      Past Medical History:  Diagnosis Date  . Breast cancer ([place])    Radiation and lumpectomy  . COPD, mild ([place])   . Family history of breast cancer   . Family history of uterine cancer   . Osteopenia    DEXA 10/27/11: -1.4, no change from 10/2009  . Shingles   . Smoker     Past Surgical History:  Procedure Laterality Date  . BREAST BIOPSY    . BREAST LUMPECTOMY    . DILATION AND EVACUATION    . MASTECTOMY Left AUG 2016  . TONSILLECTOMY AND ADENOIDECTOMY      Allergies  Allergen Reactions  . Codeine     REACTION: Rash  . Tetanus Toxoid     REACTION: Shock    Family History  Problem Relation Age of Onset  . CAD Mother        stent older age  . Hypertension Mother   . Breast cancer Mother 32  . Cancer Mother        SKIN and BLADDER   . Heart disease Paternal Grandfather   . Lung cancer Paternal Grandfather   . Hypertension Father   . Heart disease Father 84       CHF   . Dementia Father   . Hearing loss Maternal Grandfather   . Parkinson's disease Paternal Grandmother     Social History   Socioeconomic History  . Marital status: Married    Spouse name: Not on file  .  Number of children: 0  . Years of education: Not on file  . Highest education level: Not on file  Occupational History  . Not on file  Social Needs  . Financial resource strain: Not on file  . Food insecurity:    Worry: Not on file    Inability: Not on file  . Transportation needs:    Medical: Not on file    Non-medical: Not on file  Tobacco Use  . Smoking status: Current Every Day Smoker    Packs/day: 1.00    Years: 30.00    Pack years: 30.00    Types: Cigarettes  . Smokeless tobacco: Never Used  . Tobacco comment: Trying to smoke less.   Substance and Sexual Activity  . Alcohol use: Yes    Alcohol/week: 8.4 oz    Types: 14 Standard drinks  or equivalent per week    Comment: 5 DRINKS A WEEK  . Drug use: No  . Sexual activity: Never  Lifestyle  . Physical activity:    Days per week: Not on file    Minutes per session: Not on file  . Stress: Not on file  Relationships  . Social connections:    Talks on phone: Not on file    Gets together: Not on file    Attends religious service: Not on file    Active member of club or organization: Not on file    Attends meetings of clubs or organizations: Not on file    Relationship status: Not on file  Other Topics Concern  . Not on file  Social History Narrative   Lives at home with husband who is disabled.       Vitals:   05/02/17 0853  BP: 130/70  Pulse: 87  Resp: 12  Temp: 98.1 F (36.7 C)  SpO2: 100%   Body mass index is 21.31 kg/m.   Wt Readings from Last 3 Encounters:  05/02/17 124 lb 2 oz (56.3 kg)  11/15/16 124 lb 14.4 oz (56.7 kg)  06/28/16 123 lb (55.8 kg)    Physical Exam  Nursing note and vitals reviewed. Constitutional: She is oriented to person, place, and time. She appears well-developed and well-nourished. No distress.  HENT:  Head: Normocephalic and atraumatic.  Right Ear: Hearing, tympanic membrane, external ear and ear canal normal.  Left Ear: Hearing, tympanic membrane, external ear and  ear canal normal.  Mouth/Throat: Uvula is midline, oropharynx is clear and moist and mucous membranes are normal.  Eyes: Pupils are equal, round, and reactive to light. Conjunctivae and EOM are normal.  Neck: No tracheal deviation present. No thyromegaly present.  Cardiovascular: Normal rate and regular rhythm.  No murmur heard. Pulses:      Dorsalis pedis pulses are 2+ on the right side, and 2+ on the left side.  Respiratory: Effort normal and breath sounds normal. No respiratory distress.  GI: Soft. She exhibits no mass. There is no hepatomegaly. There is no tenderness.  Genitourinary:  Genitourinary Comments: Deferred to gynecologist.  Musculoskeletal: She exhibits no edema.  No major deformity or signs of synovitis appreciated.  Lymphadenopathy:    She has no cervical adenopathy.       Right: No supraclavicular adenopathy present.       Left: No supraclavicular adenopathy present.  Neurological: She is alert and oriented to person, place, and time. She has normal strength. No cranial nerve deficit. Coordination and gait normal.  Reflex Scores:      Bicep reflexes are 2+ on the right side and 2+ on the left side.      Patellar reflexes are 2+ on the right side and 2+ on the left side. Skin: Skin is warm. No rash noted. No erythema.  Psychiatric: She has a normal mood and affect. Her speech is normal.  Well groomed, good eye contact.     ASSESSMENT AND PLAN:  Ms. RAYLEIGH GILLYARD was here today for Medicare preventive visit.   Orders Placed This Encounter  Procedures  . Pneumococcal conjugate vaccine 13-valent IM  . Lipid panel  . Ambulatory Referral for Lung Cancer Scre  . Ambulatory referral to Gastroenterology   Lab Results  Component Value Date   CHOL 224 (H) 05/02/2017   HDL 82.50 05/02/2017   LDLCALC 125 (H) 05/02/2017   LDLDIRECT 127.4 03/13/2012   TRIG 82.0 05/02/2017   CHOLHDL  3 05/02/2017   Medicare welcome visit  We discussed the importance of staying  active, physically and mentally, as well as the benefits of a healthy/balance diet. Low impact exercise that involve stretching and strengthing are ideal.  We discussed preventive screening for the next 5-10 years, summery of recommendations given in AVS:  Vaccines not up-to-date, she is not interested in Prevnar 13. She is due for colonoscopy 11/2017. Glaucoma screening and eye exam every 1-2 years. Female preventive care with gyn, mammogram up to date. Diabetes screening. No FHx of AAA. Lung cancer screening. Fall prevention. She is not interested in taking medication for osteoporosis, so we did not order DEXA scan today. Advance directives and end of life discussed, she has POA and living will, bring copy if not done already.   Hyperlipidemia, unspecified hyperlipidemia type  She will continue on nonpharmacologic treatment. Further recommendations will be given according to lipid panel results as well as 10 years cardiovascular risk score.  The 10-year ASCVD risk score Mikey Bussing DC Brooke Bonito., et al., 2013) is: 9.4%   Values used to calculate the score:     Age: 17 years     Sex: Female     Is Non-Hispanic African American: No     Diabetic: No     Tobacco smoker: Yes     Systolic Blood Pressure: 536 mmHg     Is BP treated: No     HDL Cholesterol: 82.5 mg/dL     Total Cholesterol: 224 mg/dL  -     Lipid panel  TOBACCO ABUSE  Adverse effects of tobacco were discussed. Encouraged smoking cessation. She is not interested in Prevnar 13. Lung cancer screening     Ambulatory Referral for Lung Cancer Scre  Colon cancer screening -     Ambulatory referral to Gastroenterology  Other orders -     Zoster Vaccine Adjuvanted [facility]) injection; 0.5 ml in muscle and repeat in 8 weeks    Return in about 1 year (around 05/03/2018) for Medicare.       Elianna Windom G. [name], MD  [facility]. [place] office.

## 2017-05-02 ENCOUNTER — Encounter: Payer: Self-pay | Admitting: Family Medicine

## 2017-05-02 ENCOUNTER — Ambulatory Visit (INDEPENDENT_AMBULATORY_CARE_PROVIDER_SITE_OTHER): Payer: PPO | Admitting: Family Medicine

## 2017-05-02 ENCOUNTER — Telehealth: Payer: Self-pay | Admitting: Internal Medicine

## 2017-05-02 VITALS — BP 130/70 | HR 87 | Temp 98.1°F | Resp 12 | Ht 64.0 in | Wt 124.1 lb

## 2017-05-02 DIAGNOSIS — Z0000 Encounter for general adult medical examination without abnormal findings: Secondary | ICD-10-CM

## 2017-05-02 DIAGNOSIS — Z1211 Encounter for screening for malignant neoplasm of colon: Secondary | ICD-10-CM | POA: Diagnosis not present

## 2017-05-02 DIAGNOSIS — F17200 Nicotine dependence, unspecified, uncomplicated: Secondary | ICD-10-CM | POA: Diagnosis not present

## 2017-05-02 DIAGNOSIS — E785 Hyperlipidemia, unspecified: Secondary | ICD-10-CM | POA: Diagnosis not present

## 2017-05-02 DIAGNOSIS — Z23 Encounter for immunization: Secondary | ICD-10-CM | POA: Diagnosis not present

## 2017-05-02 LAB — LIPID PANEL
CHOL/HDL RATIO: 3
Cholesterol: 224 mg/dL — ABNORMAL HIGH (ref 0–200)
HDL: 82.5 mg/dL (ref >39.00)
LDL CALC: 125 mg/dL — AB (ref 0–99)
NONHDL: 141.77
Triglycerides: 82 mg/dL (ref 0.0–149.0)
VLDL: 16.4 mg/dL (ref 0.0–40.0)

## 2017-05-02 MED ORDER — ZOSTER VAC RECOMB ADJUVANTED 50 MCG/0.5ML IM SUSR: INTRAMUSCULAR | 1 refills | Status: DC

## 2017-05-02 NOTE — Telephone Encounter (Signed)
OK to schedule with Dr. Hilarie Fredrickson, see below.

## 2017-05-02 NOTE — Telephone Encounter (Signed)
See note below, please advise

## 2017-05-02 NOTE — Patient Instructions (Addendum)
A few things to remember from today's visit:   Medicare welcome visit  Hyperlipidemia, unspecified hyperlipidemia type - Plan: Lipid panel  TOBACCO ABUSE - Plan: Ambulatory Referral for Lung Cancer Scre  Colon cancer screening - Plan: Ambulatory referral to Gastroenterology   A few tips:  -As we age balance is not as good as it was, so there is a higher risks for falls. Please remove small rugs and furniture that is "in your way" and could increase the risk of falls. Stretching exercises may help with fall prevention: Yoga and Tai Chi are some examples. Low impact exercise is better, so you are not very achy the next day.  -Sun screen and avoidance of direct sun light recommended. Caution with dehydration, if working outdoors be sure to drink enough fluids.  - Some medications are not safe as we age, increases the risk of side effects and can potentially interact with other medication you are also taken;  including some of over the counter medications. Be sure to let me know when you start a new medication even if it is a dietary/vitamin supplement.   -Healthy diet low in red meet/animal fat and sugar + regular physical activity is recommended.       Screening schedule for the next 5-10 years:  Colonoscopy every 10 years,due 11/2017.  Glaucoma screening/eye exam every 1-2 years.  Mammogram for breast cancer screening annually.  Flu vaccine annually.  Diabetes screening   Fall prevention   Advance directives:  Please see a lawyer and/or go to this website to help you with advanced directives and designating a health care power of attorney so that your wishes will be followed should you become too ill to make your own medical decisions.  [email or web address]       Please be sure medication list is accurate. If a new problem present, please set up appointment sooner than planned today.

## 2017-05-02 NOTE — Telephone Encounter (Signed)
ok 

## 2017-05-02 NOTE — Telephone Encounter (Signed)
Received referral for pt to have next colon. Recall in system with Dr.Brodie for 11-2017. Pt wanting to see Dr.Pyrtle due to husband being his patient. Does Dr.Pyrtle accept for recall colon in Nov 2019.

## 2017-05-06 ENCOUNTER — Other Ambulatory Visit: Payer: Self-pay | Admitting: *Deleted

## 2017-05-06 ENCOUNTER — Encounter: Payer: Self-pay | Admitting: Family Medicine

## 2017-05-06 MED ORDER — SIMVASTATIN 10 MG PO TABS: 10.0000 mg | ORAL_TABLET | Freq: Every day | ORAL | 3 refills | Status: DC

## 2017-05-09 ENCOUNTER — Telehealth: Payer: Self-pay | Admitting: Acute Care

## 2017-05-09 DIAGNOSIS — F17210 Nicotine dependence, cigarettes, uncomplicated: Principal | ICD-10-CM

## 2017-05-09 DIAGNOSIS — Z122 Encounter for screening for malignant neoplasm of respiratory organs: Secondary | ICD-10-CM

## 2017-05-10 MED FILL — SHINGRIX 50 MCG SUS: 50 | 1 days supply | Qty: 1 | Fill #0

## 2017-05-13 NOTE — Telephone Encounter (Signed)
Tanya [name], I have discussed this pt with you.  She has had h/o breast ca in 1999 and 2016.  I have discussed this with the pt and explained why she would not qualify for lung cancer screening at this time.  Pt would like to discuss this with you as well.

## 2017-05-23 NOTE — Telephone Encounter (Signed)
SG please advise if this message can be closed.  Thanks!

## 2017-05-24 NOTE — Telephone Encounter (Signed)
Called patient to notify her Dr.Pyrtle accepted to see her for next colon. Recall changed to Dr.Pyrtle in system.

## 2017-05-31 NOTE — Telephone Encounter (Signed)
SG did you speak to pt? I just want to make sure before I close encounter.

## 2017-05-31 NOTE — Telephone Encounter (Signed)
Tanya [name], I have called [name]. I have reached out to my resources at [facility], and Dr. Weber Cooks. Because her oncologist documented the following statement:  "a little over a year out from definitive surgery for her very early stage breast cancer with no evidence of disease activity."  we will screen her. Please let her know this is only because she is no longer on chemo and because there is documentation of definitive resolution post op.   Thanks

## 2017-06-15 ENCOUNTER — Ambulatory Visit (INDEPENDENT_AMBULATORY_CARE_PROVIDER_SITE_OTHER): Payer: PPO | Admitting: Acute Care

## 2017-06-15 ENCOUNTER — Ambulatory Visit (INDEPENDENT_AMBULATORY_CARE_PROVIDER_SITE_OTHER)
Admission: RE | Admit: 2017-06-15 | Discharge: 2017-06-15 | Disposition: A | Discharge status: Home / Self Care | Payer: PPO | Source: Ambulatory Visit | Attending: Acute Care | Admitting: Acute Care

## 2017-06-15 ENCOUNTER — Encounter: Payer: Self-pay | Admitting: Acute Care

## 2017-06-15 DIAGNOSIS — Z122 Encounter for screening for malignant neoplasm of respiratory organs: Secondary | ICD-10-CM

## 2017-06-15 DIAGNOSIS — F17210 Nicotine dependence, cigarettes, uncomplicated: Secondary | ICD-10-CM

## 2017-06-15 NOTE — Progress Notes (Signed)
Shared Decision Making Visit Lung Cancer Screening Program [phone])   Eligibility:  Age 67 y.o.  Pack Years Smoking History Calculation 49 pack year smoking history (# packs/per year x # years smoked)  Recent History of coughing up blood  no  Unexplained weight loss? no ( >Than 15 pounds within the last 6 months )  Prior History Lung / other cancer no (Diagnosis within the last 5 years already requiring surveillance chest CT Scans).  Smoking Status Current Smoker  Former Smokers: Years since quit: NA  Quit Date: NA  Visit Components:  Discussion included one or more decision making aids. yes  Discussion included risk/benefits of screening. yes  Discussion included potential follow up diagnostic testing for abnormal scans. yes  Discussion included meaning and risk of over diagnosis. yes  Discussion included meaning and risk of False Positives. yes  Discussion included meaning of total radiation exposure. yes  Counseling Included:  Importance of adherence to annual lung cancer LDCT screening. yes  Impact of comorbidities on ability to participate in the program. yes  Ability and willingness to under diagnostic treatment. yes  Smoking Cessation Counseling:  Current Smokers:   Discussed importance of smoking cessation. yes  Information about tobacco cessation classes and interventions provided to patient. yes  Patient provided with "ticket" for LDCT Scan. yes  Symptomatic Patient. no  Counseling  Diagnosis Code: Tobacco Use Z72.0  Asymptomatic Patient yes  Counseling (Intermediate counseling: > three minutes counseling) Z0092  Former Smokers:   Discussed the importance of maintaining cigarette abstinence. yes  Diagnosis Code: Personal History of Nicotine Dependence. Z30.076  Information about tobacco cessation classes and interventions provided to patient. Yes  Patient provided with "ticket" for LDCT Scan. yes  Written Order for Lung Cancer  Screening with LDCT placed in Epic. Yes (CT Chest Lung Cancer Screening Low Dose W/O CM) [id number] Z12.2-Screening of respiratory organs Z87.891-Personal history of nicotine dependence  I have spent 25 minutes of face to face time with Tanya [name] discussing the risks and benefits of lung cancer screening. We viewed a power point together that explained in detail the above noted topics. We paused at intervals to allow for questions to be asked and answered to ensure understanding.We discussed that the single most powerful action that she can take to decrease her risk of developing lung cancer is to quit smoking. We discussed whether or not she is ready to commit to setting a quit date. We discussed options for tools to aid in quitting smoking including nicotine replacement therapy, non-nicotine medications, support groups, Quit Smart classes, and behavior modification. We discussed that often times setting smaller, more achievable goals, such as eliminating 1 cigarette a day for a week and then 2 cigarettes a day for a week can be helpful in slowly decreasing the number of cigarettes smoked. This allows for a sense of accomplishment as well as providing a clinical benefit. I gave her the " Be Stronger Than Your Excuses" card with contact information for community resources, classes, free nicotine replacement therapy, and access to mobile apps, text messaging, and on-line smoking cessation help. I have also given her my card and contact information in the event she needs to contact me. We discussed the time and location of the scan, and that either Doroteo Glassman RN or I will call with the results within 24-48 hours of receiving them. I have offered her  a copy of the power point we viewed  as a resource in the event they need reinforcement of  the concepts we discussed today in the office. The patient verbalized understanding of all of  the above and had no further questions upon leaving the office. They have my  contact information in the event they have any further questions.  I spent 4 minutes counseling on smoking cessation and the health risks of continued tobacco abuse.  I explained to the patient that there has been a high incidence of coronary artery disease noted on these exams. I explained that this is a non-gated exam therefore degree or severity cannot be determined. This patient is currently on statin therapy. I have asked the patient to follow-up with their PCP regarding any incidental finding of coronary artery disease and management with diet or medication as their PCP  feels is clinically indicated. The patient verbalized understanding of the above and had no further questions upon completion of the visit.      Tanya Spatz, NP 06/15/2017 12:02 PM

## 2017-06-21 ENCOUNTER — Other Ambulatory Visit: Payer: Self-pay | Admitting: Acute Care

## 2017-06-21 DIAGNOSIS — Z122 Encounter for screening for malignant neoplasm of respiratory organs: Secondary | ICD-10-CM

## 2017-06-21 DIAGNOSIS — F17210 Nicotine dependence, cigarettes, uncomplicated: Principal | ICD-10-CM

## 2017-06-30 ENCOUNTER — Encounter: Payer: Self-pay | Admitting: Obstetrics & Gynecology

## 2017-06-30 ENCOUNTER — Ambulatory Visit: Payer: PPO | Admitting: Obstetrics & Gynecology

## 2017-06-30 VITALS — BP 132/84 | Ht 64.5 in | Wt 123.0 lb

## 2017-06-30 DIAGNOSIS — Z170 Estrogen receptor positive status [ER+]: Secondary | ICD-10-CM | POA: Diagnosis not present

## 2017-06-30 DIAGNOSIS — Z01419 Encounter for gynecological examination (general) (routine) without abnormal findings: Secondary | ICD-10-CM

## 2017-06-30 DIAGNOSIS — Z9189 Other specified personal risk factors, not elsewhere classified: Secondary | ICD-10-CM

## 2017-06-30 DIAGNOSIS — Z124 Encounter for screening for malignant neoplasm of cervix: Secondary | ICD-10-CM

## 2017-06-30 DIAGNOSIS — F17200 Nicotine dependence, unspecified, uncomplicated: Secondary | ICD-10-CM

## 2017-06-30 DIAGNOSIS — Z780 Asymptomatic menopausal state: Secondary | ICD-10-CM

## 2017-06-30 DIAGNOSIS — C50412 Malignant neoplasm of upper-outer quadrant of left female breast: Secondary | ICD-10-CM | POA: Diagnosis not present

## 2017-06-30 NOTE — Progress Notes (Addendum)
Tanya [name] [birth year] [id number]   History:    67 y.o. G3P0A3  RP:  Established patient presenting for annual gyn exam   HPI: Menopause, well on no HRT.  No PMB.  No pelvic pain. Abstinent. Urine/BMs wnl.  H/O Left recurrent Breast Cancer followed by Dr Jana Hakim.  Still smoking cigarettes, trying to decrease.  Chantix not tolerated in the past.  BMI 20.79.  Osteopenia 2015 on Alendronate which she stopped because thought that it was worsening her Athritis pains.  Bone Density Osteoporosis with T-Score -2.5 in 11/2015.  Declined Prolia because of worries about potential side effects.  Left Breast Ca Dxed at age 30. Lumpectomy and sentinel lymph node biopsy for a left sided T1(mic) N0, stage IA invasive breast cancer. Had Radiation Therapy, then Tamoxifen for 5 years, then Raloxifene.  On 06/19/2014 Mammo showed calcifications in the left breast, Dx Lt mammography 06/26/2014 showing a 5 mm group of heterogeneous calcifications in the outer left breast. Bx on 07/04/2014 showed invasive and in situ ductal carcinoma, grade 2. ER 90% positive, PR 80% positive.  Seen at [facility] by Dr. Elisha Headland, a mastectomy with repeat sentinel lymph node sampling performed 08/08/2014.  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 Margins were negative. All 3 sentinel lymph nodes negative. Received Paclitaxel, Trastuzumab, Anastrozole until 2017.  Past medical history,surgical history, family history and social history were all reviewed and documented in the EPIC chart.  Gynecologic History No LMP recorded. Patient is postmenopausal. Contraception: post menopausal status Last Pap: 07/2015. Results were: Negative Last mammogram: 03/2017 Negative (S/P Left Mastectomy) Bone Density: 11/2015 Osteoporosis T-Score -2.5 Colonoscopy: 2009  Obstetric History OB History  Gravida Para Term Preterm AB Living  3 0     2 0  SAB TAB Ectopic Multiple Live Births  1            # Outcome Date GA  Lbr Len/2nd Weight Sex Delivery Anes PTL Lv  3 SAB           2 AB           1 Gravida              ROS: A ROS was performed and pertinent positives and negatives are included in the history.  GENERAL: No fevers or chills. HEENT: No change in vision, no earache, sore throat or sinus congestion. NECK: No pain or stiffness. CARDIOVASCULAR: No chest pain or pressure. No palpitations. PULMONARY: No shortness of breath, cough or wheeze. GASTROINTESTINAL: No abdominal pain, nausea, vomiting or diarrhea, melena or bright red blood per rectum. GENITOURINARY: No urinary frequency, urgency, hesitancy or dysuria. MUSCULOSKELETAL: No joint or muscle pain, no back pain, no recent trauma. DERMATOLOGIC: No rash, no itching, no lesions. ENDOCRINE: No polyuria, polydipsia, no heat or cold intolerance. No recent change in weight. HEMATOLOGICAL: No anemia or easy bruising or bleeding. NEUROLOGIC: No headache, seizures, numbness, tingling or weakness. PSYCHIATRIC: No depression, no loss of interest in normal activity or change in sleep pattern.     Exam:   BP 132/84   Ht 5' 4.5" (1.638 m)   Wt 123 lb (55.8 kg)   BMI 20.79 kg/m   Body mass index is 20.79 kg/m.  General appearance : Well developed well nourished female. No acute distress HEENT: Eyes: no retinal hemorrhage or exudates,  Neck supple, trachea midline, no carotid bruits, no thyroidmegaly Lungs: Clear to auscultation, no rhonchi or wheezes, or rib retractions  Heart: Regular rate and rhythm,  no murmurs or gallops Breast:Examined in sitting and supine position.  S/P Left Mastectomy.  Rt breast: no palpable masses or tenderness,  no skin retraction, no nipple inversion, no nipple discharge, no skin discoloration, no axillary or supraclavicular lymphadenopathy Abdomen: no palpable masses or tenderness, no rebound or guarding Extremities: no edema or skin discoloration or tenderness  Pelvic: Vulva: Normal             Vagina: No gross lesions or  discharge  Cervix: No gross lesions or discharge.  Pap reflex done  Uterus  AV, normal size, shape and consistency, non-tender and mobile  Adnexa  Without masses or tenderness  Anus: Normal   Assessment/Plan:  67 y.o. female for annual exam   1. Encounter for routine gynecological examination with Papanicolaou smear of cervix Normal gynecologic exam in menopause.  Pap reflex done today.  Rt breast exam normal/S/P Left Mastectomy.  Will organize Colonoscopy.  Health labs with Dr Jana Hakim.  Last BD 11/2015 Osteoporosis T-Score -2.5. Declines medical treatment at this time.  Will repeat BD 11/2017.  On Vit D supplements/Ca++ rich nutrition and regular weight-bearing activity as much as her Arthritis allows.  2. Menopause present Well on no HRT.  No PMB.  3. Screening for malignant neoplasm of cervix - Pap IG w/ reflex to HPV when ASC-U  4. Malignant neoplasm of upper-outer quadrant of left breast in female, estrogen receptor positive ([place]) Left recurrent invasive Breast Ca s/p Left Mastectomy in 2016.  No evidence of recurrence at 11/2016 visit with Dr Jana Hakim.  5. TOBACCO ABUSE Decreasing # of cigarette smoked daily.  Counseling done on continuing to decrease until can stop completely.  Counseling on above issues and coordination of care more than 50% for 10 minutes.  [name] MD, 2:17 PM 06/30/2017

## 2017-07-01 LAB — PAP IG W/ RFLX HPV ASCU

## 2017-07-03 ENCOUNTER — Encounter: Payer: Self-pay | Admitting: Obstetrics & Gynecology

## 2017-07-03 NOTE — Patient Instructions (Signed)
1. Encounter for routine gynecological examination with Papanicolaou smear of cervix Normal gynecologic exam in menopause.  Pap reflex done today.  Rt breast exam normal/S/P Left Mastectomy.  Will organize Colonoscopy.  Health labs with Dr Jana Hakim.  Last BD 11/2015 Osteoporosis T-Score -2.5. Declines medical treatment at this time.  Will repeat BD 11/2017.  On Vit D supplements/Ca++ rich nutrition and regular weight-bearing activity as much as her Arthritis allows.  2. Menopause present Well on no HRT.  No PMB.  3. Screening for malignant neoplasm of cervix - Pap IG w/ reflex to HPV when ASC-U  4. Malignant neoplasm of upper-outer quadrant of left breast in female, estrogen receptor positive ([place]) Left recurrent invasive Breast Ca s/p Left Mastectomy in 2016.  No evidence of recurrence at 11/2016 visit with Dr Jana Hakim.  5. TOBACCO ABUSE Decreasing # of cigarette smoked daily.  Counseling done on continuing to decrease until can stop completely.  Tanya [name], it was a pleasure meeting you today!  I will inform you of your results as soon as they are available.

## 2017-07-31 NOTE — Progress Notes (Signed)
HPI:   [name] is a 67 y.o. female, who is here today for 3 months follow up.   She was last seen on 05/02/17 for Medicare preventive visit. Since her last visit she followed with her gynecologist, Dr. Dellis [name].   Hyperlipidemia:  Currently on Simvastatin 10 mg daily. Following a low fat diet: Ice cream and cookies daily.  She has not noted side effects with medication.  Lab Results  Component Value Date   CHOL 224 (H) 05/02/2017   HDL 82.50 05/02/2017   LDLCALC 125 (H) 05/02/2017   LDLDIRECT 127.4 03/13/2012   TRIG 82.0 05/02/2017   CHOLHDL 3 05/02/2017       + Tobacco use. Hx of COPD. Lung cancer screening 06/2017 was Bi-rads 2.  History of breast cancer, she has appointment with her oncologist, Dr. Jana [name], on 11/22/2017.   Review of Systems  Constitutional: Negative for appetite change and fatigue.  Respiratory: Negative for shortness of breath and wheezing.   Cardiovascular: Negative for chest pain, palpitations and leg swelling.  Gastrointestinal: Negative for abdominal pain, nausea and vomiting.       No changes in bowel habits.  Endocrine: Negative for polydipsia, polyphagia and polyuria.  Musculoskeletal: Negative for gait problem and myalgias.  Skin: Negative for rash.  Neurological: Negative for syncope, weakness and headaches.    Current Outpatient Medications on File Prior to Visit  Medication Sig Dispense Refill  . Calcium Carbonate-Vitamin D 600-400 MG-UNIT tablet Take 1 tablet by mouth daily.    . Cholecalciferol (VITAMIN D-1000 MAX ST) 1000 UNITS tablet Take 1,000 Units by mouth.    Marland Kitchen ibuprofen (MOTRIN IB) 200 MG tablet Take 1 tablet (200 mg total) by mouth every 6 (six) hours as needed. 30 tablet 0  . Misc Natural Products (OSTEO BI-FLEX TRIPLE STRENGTH) TABS Take 1 tablet by mouth 2 (two) times daily.    . Multiple Vitamin (MULTIVITAMIN) tablet Take 1 tablet by mouth daily.    . simvastatin (ZOCOR) 10 MG tablet Take 1 tablet  (10 mg total) by mouth at bedtime. 90 tablet 3  . Zoster Vaccine Adjuvanted [facility]) injection 0.5 ml in muscle and repeat in 8 weeks 0.5 mL 1   No current facility-administered medications on file prior to visit.      Past Medical History:  Diagnosis Date  . Breast cancer ([place])    Radiation and lumpectomy  . COPD, mild ([place])   . Family history of breast cancer   . Family history of uterine cancer   . Osteopenia    DEXA 10/27/11: -1.4, no change from 10/2009  . Shingles   . Smoker    Allergies  Allergen Reactions  . Codeine     REACTION: Rash  . Tetanus Toxoid     REACTION: Shock    Social History   Socioeconomic History  . Marital status: Married    Spouse name: Not on file  . Number of children: 0  . Years of education: Not on file  . Highest education level: Not on file  Occupational History  . Not on file  Social Needs  . Financial resource strain: Not on file  . Food insecurity:    Worry: Not on file    Inability: Not on file  . Transportation needs:    Medical: Not on file    Non-medical: Not on file  Tobacco Use  . Smoking status: Current Every Day Smoker    Packs/day: 1.00    Years:  49.00    Pack years: 49.00    Types: Cigarettes  . Smokeless tobacco: Never Used  . Tobacco comment: Trying to smoke less.   Substance and Sexual Activity  . Alcohol use: Yes    Alcohol/week: 8.4 oz    Types: 14 Standard drinks or equivalent per week    Comment: 5 DRINKS A WEEK  . Drug use: No  . Sexual activity: Never  Lifestyle  . Physical activity:    Days per week: Not on file    Minutes per session: Not on file  . Stress: Not on file  Relationships  . Social connections:    Talks on phone: Not on file    Gets together: Not on file    Attends religious service: Not on file    Active member of club or organization: Not on file    Attends meetings of clubs or organizations: Not on file    Relationship status: Not on file  Other Topics Concern  . Not on  file  Social History Narrative   Lives at home with husband who is disabled.      Vitals:   08/01/17 0911  BP: 120/66  Pulse: 78  Resp: 12  Temp: 98.6 F (37 C)  SpO2: 99%   Body mass index is 20.62 kg/m.   Physical Exam  Nursing note and vitals reviewed. Constitutional: She is oriented to person, place, and time. She appears well-developed and well-nourished. No distress.  HENT:  Head: Normocephalic and atraumatic.  Mouth/Throat: Oropharynx is clear and moist and mucous membranes are normal.  Eyes: Pupils are equal, round, and reactive to light. Conjunctivae are normal.  Cardiovascular: Normal rate and regular rhythm.  No murmur heard. Pulses:      Dorsalis pedis pulses are 2+ on the right side, and 2+ on the left side.  Respiratory: Effort normal and breath sounds normal. No respiratory distress.  GI: Soft. She exhibits no mass. There is no hepatomegaly. There is no tenderness.  Musculoskeletal: She exhibits no edema.  Lymphadenopathy:    She has no cervical adenopathy.  Neurological: She is alert and oriented to person, place, and time. She has normal strength. Coordination normal.  Skin: Skin is warm. No erythema.  Psychiatric: She has a normal mood and affect.  Well groomed, good eye contact.       ASSESSMENT AND PLAN:   [name] was seen today for 3 months follow-up.  Orders Placed This Encounter  Procedures  . Lipid panel  . Hepatic function panel   Lab Results  Component Value Date   CHOL 183 08/01/2017   HDL 85.40 08/01/2017   LDLCALC 86 08/01/2017   LDLDIRECT 127.4 03/13/2012   TRIG 57.0 08/01/2017   CHOLHDL 2 08/01/2017   Lab Results  Component Value Date   ALT 15 08/01/2017   AST 19 08/01/2017   ALKPHOS 48 08/01/2017   BILITOT 0.5 08/01/2017    Hyperlipidemia, unspecified hyperlipidemia type  No changes in current management, will follow labs done today and will give further recommendations accordingly. Low fat diet also  recommended.  - Lipid panel - Hepatic function panel   TOBACCO ABUSE  We discussed the effects of smoking as well as benefits. She is not interested in smoking cessation,, she has tried in the past unsuccessfully.     Brendalee Matthies G. [name], MD  [facility]. [place] office.

## 2017-08-01 ENCOUNTER — Ambulatory Visit (INDEPENDENT_AMBULATORY_CARE_PROVIDER_SITE_OTHER): Payer: PPO | Admitting: Family Medicine

## 2017-08-01 ENCOUNTER — Encounter: Payer: Self-pay | Admitting: Family Medicine

## 2017-08-01 VITALS — BP 120/66 | HR 78 | Temp 98.6°F | Resp 12 | Ht 64.5 in | Wt 122.0 lb

## 2017-08-01 DIAGNOSIS — E785 Hyperlipidemia, unspecified: Secondary | ICD-10-CM

## 2017-08-01 DIAGNOSIS — F17200 Nicotine dependence, unspecified, uncomplicated: Secondary | ICD-10-CM

## 2017-08-01 LAB — HEPATIC FUNCTION PANEL
ALT: 15 U/L (ref 0–35)
AST: 19 U/L (ref 0–37)
Albumin: 4.4 g/dL (ref 3.5–5.2)
Alkaline Phosphatase: 48 U/L (ref 39–117)
Bilirubin, Direct: 0.1 mg/dL (ref 0.0–0.3)
TOTAL PROTEIN: 6.9 g/dL (ref 6.0–8.3)
Total Bilirubin: 0.5 mg/dL (ref 0.2–1.2)

## 2017-08-01 LAB — LIPID PANEL
CHOLESTEROL: 183 mg/dL (ref 0–200)
HDL: 85.4 mg/dL (ref >39.00)
LDL CALC: 86 mg/dL (ref 0–99)
NonHDL: 97.62
TRIGLYCERIDES: 57 mg/dL (ref 0.0–149.0)
Total CHOL/HDL Ratio: 2
VLDL: 11.4 mg/dL (ref 0.0–40.0)

## 2017-08-01 NOTE — Patient Instructions (Addendum)
A few things to remember from today's visit:   Hyperlipidemia, unspecified hyperlipidemia type - Plan: Lipid panel, Hepatic function panel  Cholesterol Cholesterol is a white, waxy, fat-like substance that is needed by the human body in small amounts. The liver makes all the cholesterol we need. Cholesterol is carried from the liver by the blood through the blood vessels. Deposits of cholesterol (plaques) may build up on blood vessel (artery) walls. Plaques make the arteries narrower and stiffer. Cholesterol plaques increase the risk for heart attack and stroke. You cannot feel your cholesterol level even if it is very high. The only way to know that it is high is to have a blood test. Once you know your cholesterol levels, you should keep a record of the test results. Work with your health care provider to keep your levels in the desired range. What do the results mean?  Total cholesterol is a rough measure of all the cholesterol in your blood.  LDL (low-density lipoprotein) is the "bad" cholesterol. This is the type that causes plaque to build up on the artery walls. You want this level to be low.  HDL (high-density lipoprotein) is the "good" cholesterol because it cleans the arteries and carries the LDL away. You want this level to be high.  Triglycerides are fat that the body can either burn for energy or store. High levels are closely linked to heart disease. What are the desired levels of cholesterol?  Total cholesterol below 200.  LDL below 100 for people who are at risk, below 70 for people at very high risk.  HDL above 40 is good. A level of 60 or higher is considered to be protective against heart disease.  Triglycerides below 150. How can I lower my cholesterol? Diet Follow your diet program as told by your health care provider.  Choose fish or white meat chicken and [place], roasted or baked. Limit fatty cuts of red meat, fried foods, and processed meats, such as sausage and  lunch meats.  Eat lots of fresh fruits and vegetables.  Choose whole grains, beans, pasta, potatoes, and cereals.  Choose olive oil, corn oil, or canola oil, and use only small amounts.  Avoid butter, mayonnaise, shortening, or palm kernel oils.  Avoid foods with trans fats.  Drink skim or nonfat milk and eat low-fat or nonfat yogurt and cheeses. Avoid whole milk, cream, ice cream, egg yolks, and full-fat cheeses.  Healthier desserts include angel food cake, ginger snaps, animal crackers, hard candy, popsicles, and low-fat or nonfat frozen yogurt. Avoid pastries, cakes, pies, and cookies.  Exercise  Follow your exercise program as told by your health care provider. A regular program: ? Helps to decrease LDL and raise HDL. ? Helps with weight control.  Do things that increase your activity level, such as gardening, walking, and taking the stairs.  Ask your health care provider about ways that you can be more active in your daily life.  Medicine  Take over-the-counter and prescription medicines only as told by your health care provider. ? Medicine may be prescribed by your health care provider to help lower cholesterol and decrease the risk for heart disease. This is usually done if diet and exercise have failed to bring down cholesterol levels. ? If you have several risk factors, you may need medicine even if your levels are normal.  This information is not intended to replace advice given to you by your health care provider. Make sure you discuss any questions you have with your  health care provider. Document Released: 09/15/2000 Document Revised: 07/19/2015 Document Reviewed: 06/21/2015 Elsevier Interactive Patient Education  [employer].   Please be sure medication list is accurate. If a new problem present, please set up appointment sooner than planned today.

## 2017-08-03 ENCOUNTER — Encounter: Payer: Self-pay | Admitting: Family Medicine

## 2017-08-03 MED FILL — SHINGRIX 50 MCG SUS: 50 | 1 days supply | Qty: 1 | Fill #1

## 2017-10-18 ENCOUNTER — Ambulatory Visit (INDEPENDENT_AMBULATORY_CARE_PROVIDER_SITE_OTHER): Payer: PPO

## 2017-10-18 DIAGNOSIS — Z23 Encounter for immunization: Secondary | ICD-10-CM

## 2017-10-21 ENCOUNTER — Telehealth: Payer: Self-pay | Admitting: *Deleted

## 2017-10-21 ENCOUNTER — Other Ambulatory Visit: Payer: Self-pay | Admitting: Obstetrics & Gynecology

## 2017-10-21 DIAGNOSIS — M818 Other osteoporosis without current pathological fracture: Secondary | ICD-10-CM

## 2017-10-21 NOTE — Telephone Encounter (Signed)
Tonya from breast center called requesting order for dexa in epic, patient is wanting to schedule dexa at breast center, order placed they will call to schedule.

## 2017-11-04 ENCOUNTER — Encounter: Payer: Self-pay | Admitting: Internal Medicine

## 2017-11-15 ENCOUNTER — Inpatient Hospital Stay: Payer: PPO | Attending: Oncology

## 2017-11-15 DIAGNOSIS — F17210 Nicotine dependence, cigarettes, uncomplicated: Secondary | ICD-10-CM | POA: Insufficient documentation

## 2017-11-15 DIAGNOSIS — Z170 Estrogen receptor positive status [ER+]: Secondary | ICD-10-CM | POA: Diagnosis not present

## 2017-11-15 DIAGNOSIS — Z791 Long term (current) use of non-steroidal anti-inflammatories (NSAID): Secondary | ICD-10-CM | POA: Insufficient documentation

## 2017-11-15 DIAGNOSIS — Z79899 Other long term (current) drug therapy: Secondary | ICD-10-CM | POA: Insufficient documentation

## 2017-11-15 DIAGNOSIS — M8580 Other specified disorders of bone density and structure, unspecified site: Secondary | ICD-10-CM | POA: Diagnosis not present

## 2017-11-15 DIAGNOSIS — C50412 Malignant neoplasm of upper-outer quadrant of left female breast: Secondary | ICD-10-CM | POA: Diagnosis not present

## 2017-11-15 LAB — COMPREHENSIVE METABOLIC PANEL
ALK PHOS: 54 U/L (ref 38–126)
ALT: 18 U/L (ref 0–44)
ANION GAP: 9 (ref 5–15)
AST: 22 U/L (ref 15–41)
Albumin: 4.2 g/dL (ref 3.5–5.0)
BUN: 20 mg/dL (ref 8–23)
CALCIUM: 9.6 mg/dL (ref 8.9–10.3)
CHLORIDE: 101 mmol/L (ref 98–111)
CO2: 29 mmol/L (ref 22–32)
Creatinine, Ser: 0.72 mg/dL (ref 0.44–1.00)
Glucose, Bld: 80 mg/dL (ref 70–99)
Potassium: 4.3 mmol/L (ref 3.5–5.1)
Sodium: 139 mmol/L (ref 135–145)
Total Bilirubin: 0.6 mg/dL (ref 0.3–1.2)
Total Protein: 7 g/dL (ref 6.5–8.1)

## 2017-11-15 LAB — CBC WITH DIFFERENTIAL/PLATELET
Abs Immature Granulocytes: 0.01 10*3/uL (ref 0.00–0.07)
BASOS PCT: 1 %
Basophils Absolute: 0 10*3/uL (ref 0.0–0.1)
EOS PCT: 2 %
Eosinophils Absolute: 0.1 10*3/uL (ref 0.0–0.5)
HCT: 42.7 % (ref 36.0–46.0)
Hemoglobin: 14.5 g/dL (ref 12.0–15.0)
Immature Granulocytes: 0 %
Lymphocytes Relative: 31 %
Lymphs Abs: 1.7 10*3/uL (ref 0.7–4.0)
MCH: 33.3 pg (ref 26.0–34.0)
MCHC: 34 g/dL (ref 30.0–36.0)
MCV: 97.9 fL (ref 80.0–100.0)
MONO ABS: 0.5 10*3/uL (ref 0.1–1.0)
Monocytes Relative: 9 %
Neutro Abs: 3.1 10*3/uL (ref 1.7–7.7)
Neutrophils Relative %: 57 %
PLATELETS: 164 10*3/uL (ref 150–400)
RBC: 4.36 MIL/uL (ref 3.87–5.11)
RDW: 14 % (ref 11.5–15.5)
WBC: 5.4 10*3/uL (ref 4.0–10.5)
nRBC: 0 % (ref 0.0–0.2)

## 2017-11-22 ENCOUNTER — Inpatient Hospital Stay (HOSPITAL_BASED_OUTPATIENT_CLINIC_OR_DEPARTMENT_OTHER): Payer: PPO | Admitting: Oncology

## 2017-11-22 VITALS — BP 120/67 | HR 76 | Temp 98.6°F | Resp 18 | Ht 64.5 in | Wt 121.6 lb

## 2017-11-22 DIAGNOSIS — Z170 Estrogen receptor positive status [ER+]: Secondary | ICD-10-CM

## 2017-11-22 DIAGNOSIS — C50412 Malignant neoplasm of upper-outer quadrant of left female breast: Secondary | ICD-10-CM | POA: Diagnosis not present

## 2017-11-22 DIAGNOSIS — F17210 Nicotine dependence, cigarettes, uncomplicated: Secondary | ICD-10-CM | POA: Diagnosis not present

## 2017-11-22 DIAGNOSIS — Z791 Long term (current) use of non-steroidal anti-inflammatories (NSAID): Secondary | ICD-10-CM

## 2017-11-22 DIAGNOSIS — Z79899 Other long term (current) drug therapy: Secondary | ICD-10-CM | POA: Diagnosis not present

## 2017-11-22 DIAGNOSIS — M8580 Other specified disorders of bone density and structure, unspecified site: Secondary | ICD-10-CM

## 2017-11-22 NOTE — Progress Notes (Signed)
Tanya [name]  [phone] [phone]     ID: Tanya [name] DOB: 1950-12-15  MR#: [id number]  CSN#: [id number]  Patient Care Team: [name], [name], MD as PCP - General (Family Medicine) [name], MD (Inactive) (Gastroenterology) [name], MD (Obstetrics and Gynecology) Lora Chavers, [name], MD as Consulting Physician (Oncology) [name], MD as Referring Physician (Surgery) [name], MD as Consulting Physician (Orthopedic Surgery)  OTHER MD: [name] MD, [name] MD  CHIEF COMPLAINT: Estrogen receptor positive breast cancer  CURRENT TREATMENT: Observation   BREAST CANCER HISTORY: From the original intake note:  At the age of 60, the patient underwent lumpectomy and sentinel lymph node biopsy for a left sided T1(mic) N0, stage IA invasive breast cancer. She completed whole breast irradiation and received tamoxifen for 5 years. She has continued on raloxifene.  More recently, screening mammography at the breast Center with tomography 06/19/2014 showed calcifications in the left breast, which were further evaluated with diagnostic left mammography 06/26/2014. Breast density was category C. There was a 5 mm group of heterogeneous calcifications in the outer left breast. This was biopsied 07/04/2014, and showed ([id number]) invasive and in situ ductal carcinoma, grade 2. There was a minimal amount of invasive tumor, but it did allow for prognostic panel, which showed the cancer to be estrogen receptor 90% positive, progesterone receptor 80% positive, both with strong staining intensity, with an MIB-1 of 80%, and HER-2 amplified, with a signals ratio of 8.09, the number per cell being 8.90.  The patient then was evaluated at [facility] by Dr. Elisha Headland and, given the prior history of radiation, mastectomy with repeat sentinel lymph node sampling was suggested. This was performed 08/08/2014, and showed ([id number]) invasive ductal carcinoma, grade 1, measuring 0.3 cm. Margins were negative. All 3 sentinel lymph nodes sampled were negative. Repeat prognostic panel was obtained on the DCIS only, and found the tumor to be estrogen receptor positive at 65% and progesterone receptor positive at 75%.  The patient's subsequent history is as detailed below.   INTERVAL HISTORY: (zometa December?)  Tanya [name] returns today for follow-up of her estrogen receptor positive breast cancer.  She continues on observation alone.   Since her last visit, she underwent right breast mammography on 03/15/2017, which showed no evidence of malignancy.  She also underwent chest CT for lung cancer screening on 06/15/2017. This showed lung-RADS 2, benign appearance or behavior.   REVIEW OF SYSTEMS: Tanya [name] reports walking her 43 y.o dog for 20 minutes every day. She is upgrading her kitchen floor and walls in the near future. She takes care of her husband, who has COPD and recently broke his shoulder. However, his shoulder isn't healing properly, she tells me, and he is unable to help around the house. She has not taken steps to stop smoking as of yet. She had a pap smear performed on 06/30/2017, which was normal. Her next bone density test is scheduled for 12/30/2017. The patient denies unusual headaches, visual changes, nausea, vomiting, stiff neck, dizziness, or gait imbalance. There has been no cough, phlegm production, or pleurisy, no chest pain or pressure, and no change in bowel or bladder habits. The patient denies fever, rash, bleeding, unexplained fatigue or unexplained weight loss. A detailed review of systems was otherwise entirely negative.    PAST MEDICAL HISTORY: Past Medical History:  Diagnosis Date  . Breast cancer ([place])    Radiation and lumpectomy  . COPD, mild ([place])   .  Family history of breast cancer   . Family history of uterine cancer   . Osteopenia    DEXA 10/27/11: -1.4, no change from 10/2009  .  Shingles   . Smoker     PAST SURGICAL HISTORY: Past Surgical History:  Procedure Laterality Date  . BREAST BIOPSY    . BREAST LUMPECTOMY    . DILATION AND EVACUATION    . MASTECTOMY Left AUG 2016  . TONSILLECTOMY AND ADENOIDECTOMY      FAMILY HISTORY Family History  Problem Relation Age of Onset  . CAD Mother        stent older age  . Hypertension Mother   . Breast cancer Mother 12  . Cancer Mother        SKIN and BLADDER   . Heart disease Paternal Grandfather   . Lung cancer Paternal Grandfather   . Hypertension Father   . Heart disease Father 42       CHF   . Dementia Father   . Hearing loss Maternal Grandfather   . Parkinson's disease Paternal Grandmother   The patient's father died with Alzheimer's disease at age 46. The patient's mother was diagnosed with breast cancer at age 91. She died from C. difficile colitis at age 68. The patient has one brother, one sister. There is no history of breast or ovarian cancer in the immediate family.  GYNECOLOGIC HISTORY:  No LMP recorded. Patient is postmenopausal. Menarche age 67, the patient is GX P0. She underwent menopause approximately the year 2000. She did not take hormone replacement. 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 Her husband, [name], used to be in Press photographer (Set designer) but now has had 2 back fusions and is disabled. At home is just the 2 of them plus her dog Tanya [name]   ADVANCED DIRECTIVES: In place   HEALTH MAINTENANCE: Social History   Tobacco Use  . Smoking status: Current Every Day Smoker    Packs/day: 1.00    Years: 49.00    Pack years: 49.00    Types: Cigarettes  . Smokeless tobacco: Never Used  . Tobacco comment: Trying to smoke less.   Substance Use Topics  . Alcohol use: Yes    Alcohol/week: 14.0 standard drinks    Types: 14 Standard drinks or equivalent per week     Comment: 5 DRINKS A WEEK  . Drug use: No     Colonoscopy: November 2009  PAP: 06/30/2017-- normal  Bone density: November 2017: -2.5  Lipid panel: 08/01/2017-- normal  Allergies  Allergen Reactions  . Codeine     REACTION: Rash  . Tetanus Toxoid     REACTION: Shock   Current Outpatient Medications  Medication Sig Dispense Refill  . Calcium Carbonate-Vitamin D 600-400 MG-UNIT tablet Take 1 tablet by mouth daily.    . Cholecalciferol (VITAMIN D-1000 Tanya [name] ST) 1000 UNITS tablet Take 1,000 Units by mouth.    Marland Kitchen ibuprofen (MOTRIN IB) 200 MG tablet Take 1 tablet (200 mg total) by mouth every 6 (six) hours as needed. 30 tablet 0  . Misc Natural Products (OSTEO BI-FLEX TRIPLE STRENGTH) TABS Take 1 tablet by mouth 2 (two) times daily.    . Multiple Vitamin (MULTIVITAMIN) tablet Take 1 tablet by mouth daily.    . simvastatin (ZOCOR) 10 MG tablet Take 1 tablet (10 mg total) by mouth at bedtime. 90 tablet 3  .  Zoster Vaccine Adjuvanted [facility]) injection 0.5 ml in muscle and repeat in 8 weeks 0.5 mL 1   No current facility-administered medications for this visit.     OBJECTIVE: Middle-aged white woman who appears stated age  67:   11/22/17 1100  BP: 120/67  Pulse: 76  Resp: 18  Temp: 98.6 F (37 C)  TempSrc: Oral  SpO2: 98%  Weight: 121 lb 9.6 oz (55.2 kg)  Height: 5' 4.5" (1.638 m)     ECOG FS:1 - Symptomatic but completely ambulatory  Sclerae unicteric, EOMs intact Oropharynx clear and moist No cervical or supraclavicular adenopathy Lungs no rales or rhonchi Heart regular rate and rhythm Abd soft, nontender, positive bowel sounds MSK no focal spinal tenderness, no upper extremity lymphedema Neuro: nonfocal, well oriented, appropriate affect Breasts: The right breast is benign.  The left breast status post mastectomy.  There is no evidence of chest wall recurrence.  Both axillae are benign.  LAB RESULTS: CBC    Component Value Date/Time   WBC 5.4 11/15/2017 1036     RBC 4.36 11/15/2017 1036   HGB 14.5 11/15/2017 1036   HGB 15.5 11/08/2016 1131   HCT 42.7 11/15/2017 1036   HCT 45.4 11/08/2016 1131   PLT 164 11/15/2017 1036   PLT 176 11/08/2016 1131   MCV 97.9 11/15/2017 1036   MCV 99.1 11/08/2016 1131   MCH 33.3 11/15/2017 1036   MCHC 34.0 11/15/2017 1036   RDW 14.0 11/15/2017 1036   RDW 13.2 11/08/2016 1131   LYMPHSABS 1.7 11/15/2017 1036   LYMPHSABS 1.7 11/08/2016 1131   MONOABS 0.5 11/15/2017 1036   MONOABS 0.5 11/08/2016 1131   EOSABS 0.1 11/15/2017 1036   EOSABS 0.0 11/08/2016 1131   BASOSABS 0.0 11/15/2017 1036   BASOSABS 0.0 11/08/2016 1131   CMP Latest Ref Rng & Units 11/15/2017 08/01/2017 11/08/2016  Glucose 70 - 99 mg/dL 80 - 82  BUN 8 - 23 mg/dL 20 - 15.1  Creatinine 0.44 - 1.00 mg/dL 0.72 - 0.8  Sodium 135 - 145 mmol/L 139 - 138  Potassium 3.5 - 5.1 mmol/L 4.3 - 4.7  Chloride 98 - 111 mmol/L 101 - -  CO2 22 - 32 mmol/L 29 - 31(H)  Calcium 8.9 - 10.3 mg/dL 9.6 - 10.0  Total Protein 6.5 - 8.1 g/dL 7.0 6.9 7.4  Total Bilirubin 0.3 - 1.2 mg/dL 0.6 0.5 0.52  Alkaline Phos 38 - 126 U/L 54 48 48  AST 15 - 41 U/L '22 19 20  ' ALT 0 - 44 U/L '18 15 14    ' No results found for: LABCA2  No components found for: LABCA125  No results for input(s): INR in the last 168 hours.  Urinalysis    Component Value Date/Time   COLORURINE YELLOW 04/03/2014 1059   APPEARANCEUR CLEAR 04/03/2014 1059   LABSPEC <=1.005 (A) 04/03/2014 1059   PHURINE 7.0 04/03/2014 1059   GLUCOSEU NEGATIVE 04/03/2014 1059   HGBUR TRACE-INTACT (A) 04/03/2014 1059   BILIRUBINUR NEGATIVE 04/03/2014 1059   KETONESUR NEGATIVE 04/03/2014 1059   PROTEINUR NEG 06/08/2013 1016   UROBILINOGEN 0.2 04/03/2014 1059   NITRITE NEGATIVE 04/03/2014 1059   LEUKOCYTESUR NEGATIVE 04/03/2014 1059    STUDIES:- Mammography and CT scan discussed with the patient.  ASSESSMENT: 67 y.o. [place] woman  (1) status post left Breast upper outer quadrant lumpectomy and sentinel  lymph node sampling 1999 for a T1(mic) N0, stage IA invasive breast cancer,  (a) status post adjuvant radiation  (b) status post tamoxifen for  5 years  (2) status post left breast biopsy 07/04/2014 for a clinical T1a N0, stage IA invasive ductal carcinoma, triple positive, with an MIB-1 of 80%--the patient was on raloxifene at the time  (3) status post left mastectomy and sentinel lymph node sampling 08/06/2014 for a pT1a pN0, stage IA invasive ductal carcinoma, grade 2, with negative margins. There was insufficient tissue to repeat HER-2 testing  (4) adjuvant chemotherapy started 09/16/2014 consisting of paclitaxel weekly 12 with trastuzumab given every 3 weeks, continuing the trastuzumab alone to complete 1 year, but stopped 12/16/2014 for financial reasons  (a) completed 12 doses of paclitaxel 12/02/2014.  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  (7) tobacco abuse: The patient has been strongly advised to quit  (a) Wellbutrin prescribed 11/11/2014, not started by patient will  (b) unable to tolerate Chantix  PLAN: Tanya [name] is now a little over 3 years out from definitive surgery for breast cancer with no evidence of disease recurrence.  This is very favorable.  Today we discussed smoking cessation in detail and I gave her information on our quit Smart program as well as information from the [employer].  She will have her next mammogram in March and she will have another CT of the chest which her primary care physician orders in June screen for lung cancer  She knows to call for  any issues that may develop before the next visit.  Lear Carstens, [name], MD  11/22/17 11:34 AM Medical Oncology and Hematology [facility] [address] [place], [place] [address] Tel. [phone]    Fax. [phone]  I, Wilburn Mylar am acting as scribe for Dr. [name]. Yeray Tomas.  I, Lurline Del MD, have reviewed the above documentation for accuracy and completeness, and I agree with the above.

## 2017-11-29 ENCOUNTER — Other Ambulatory Visit: Payer: Self-pay

## 2017-11-29 ENCOUNTER — Encounter: Payer: Self-pay | Admitting: Internal Medicine

## 2017-11-29 ENCOUNTER — Ambulatory Visit (AMBULATORY_SURGERY_CENTER): Payer: Self-pay

## 2017-11-29 VITALS — Ht 65.0 in | Wt 122.0 lb

## 2017-11-29 DIAGNOSIS — Z1211 Encounter for screening for malignant neoplasm of colon: Secondary | ICD-10-CM

## 2017-11-29 MED ORDER — PEG 3350-KCL-NA BICARB-NACL 420 G PO SOLR: 4000.0000 mL | Freq: Once | ORAL | 0 refills | Status: AC

## 2017-11-29 NOTE — Progress Notes (Signed)
No egg or soy allergy known to patient  No issues with past sedation with any surgeries  or procedures, no intubation problems  No diet pills per patient No home 02 use per patient  No blood thinners per patient  Pt denies issues with constipation  No A fib or A flutter  EMMI video sent to pt's e mail , pt declined    

## 2017-12-16 ENCOUNTER — Encounter: Payer: Self-pay | Admitting: Internal Medicine

## 2017-12-16 ENCOUNTER — Ambulatory Visit (AMBULATORY_SURGERY_CENTER): Payer: PPO | Admitting: Internal Medicine

## 2017-12-16 VITALS — BP 123/47 | HR 77 | Resp 10

## 2017-12-16 DIAGNOSIS — Z1211 Encounter for screening for malignant neoplasm of colon: Secondary | ICD-10-CM | POA: Diagnosis not present

## 2017-12-16 DIAGNOSIS — J449 Chronic obstructive pulmonary disease, unspecified: Secondary | ICD-10-CM | POA: Diagnosis not present

## 2017-12-16 DIAGNOSIS — Z853 Personal history of malignant neoplasm of breast: Secondary | ICD-10-CM | POA: Diagnosis not present

## 2017-12-16 NOTE — Op Note (Signed)
[facility] Patient Name: Tanya [name] Procedure Date: 12/16/2017 9:22 AM MRN: [id number] Endoscopist: Jerene Bears , MD Age: 67 Referring MD:  Date of Birth: 1950-04-16 Gender: Female Account #: [id number] Procedure:                Colonoscopy Indications:              Screening for colorectal malignant neoplasm, Last                            colonoscopy 10 years ago Medicines:                Monitored Anesthesia Care Procedure:                Pre-Anesthesia Assessment:                           - Prior to the procedure, a History and Physical                            was performed, and patient medications and                            allergies were reviewed. The patient's tolerance of                            previous anesthesia was also reviewed. The risks                            and benefits of the procedure and the sedation                            options and risks were discussed with the patient.                            All questions were answered, and informed consent                            was obtained. Prior Anticoagulants: The patient has                            taken no previous anticoagulant or antiplatelet                            agents. ASA Grade Assessment: II - A patient with                            mild systemic disease. After reviewing the risks                            and benefits, the patient was deemed in                            satisfactory condition to undergo the procedure.  After obtaining informed consent, the colonoscope                            was passed under direct vision. Throughout the                            procedure, the patient's blood pressure, pulse, and                            oxygen saturations were monitored continuously. The                            Model PCF-[id number]) scope was introduced                            through the anus and advanced to  the cecum,                            identified by appendiceal orifice and ileocecal                            valve. The colonoscopy was performed without                            difficulty. The patient tolerated the procedure                            well. The quality of the bowel preparation was good                            (Golytely, but not tolerated well by patient,                            recommend lower volume preparation at next                            screening). The ileocecal valve, appendiceal                            orifice, and rectum were photographed. Scope In: 9:28:45 AM Scope Out: 9:48:40 AM Scope Withdrawal Time: 0 hours 9 minutes 55 seconds  Total Procedure Duration: 0 hours 19 minutes 55 seconds  Findings:                 The digital rectal exam was normal.                           Multiple small and large-mouthed diverticula were                            found in the sigmoid colon.                           Internal hemorrhoids were found during  retroflexion. The hemorrhoids were small.                           The exam was otherwise without abnormality. Complications:            No immediate complications. Estimated Blood Loss:     Estimated blood loss: none. Impression:               - Diverticulosis in the sigmoid colon.                           - Small internal hemorrhoids.                           - The examination was otherwise normal.                           - No specimens collected. Recommendation:           - Patient has a contact number available for                            emergencies. The signs and symptoms of potential                            delayed complications were discussed with the                            patient. Return to normal activities tomorrow.                            Written discharge instructions were provided to the                            patient.                            - Resume previous diet.                           - Continue present medications.                           - Await pathology results.                           - Repeat colonoscopy in 10 years for screening                            purposes. Jerene Bears, MD 12/16/2017 9:52:10 AM This report has been signed electronically.

## 2017-12-16 NOTE — Progress Notes (Signed)
To recovery, report to RN, VSS. 

## 2017-12-16 NOTE — Progress Notes (Signed)
Pt's states no medical or surgical changes since previsit or office visit. 

## 2017-12-16 NOTE — Patient Instructions (Signed)
Impression/Recommendations:  Diverticulosis handout given to patient. Hemorrhoid handout given to patient.  Resume previous diet. Continue present medications.  Repeat colonoscopy in 10 years for screening purposes.  YOU HAD AN ENDOSCOPIC PROCEDURE TODAY AT [facility]:   Refer to the procedure report that was given to you for any specific questions about what was found during the examination.  If the procedure report does not answer your questions, please call your gastroenterologist to clarify.  If you requested that your care partner not be given the details of your procedure findings, then the procedure report has been included in a sealed envelope for you to review at your convenience later.  YOU SHOULD EXPECT: Some feelings of bloating in the abdomen. Passage of more gas than usual.  Walking can help get rid of the air that was put into your GI tract during the procedure and reduce the bloating. If you had a lower endoscopy (such as a colonoscopy or flexible sigmoidoscopy) you may notice spotting of blood in your stool or on the toilet paper. If you underwent a bowel prep for your procedure, you may not have a normal bowel movement for a few days.  Please Note:  You might notice some irritation and congestion in your nose or some drainage.  This is from the oxygen used during your procedure.  There is no need for concern and it should clear up in a day or so.  SYMPTOMS TO REPORT IMMEDIATELY:   Following lower endoscopy (colonoscopy or flexible sigmoidoscopy):  Excessive amounts of blood in the stool  Significant tenderness or worsening of abdominal pains  Swelling of the abdomen that is new, acute  Fever of 100F or higher For urgent or emergent issues, a gastroenterologist can be reached at any hour by calling [phone].   DIET:  We do recommend a small meal at first, but then you may proceed to your regular diet.  Drink plenty of fluids but you should avoid  alcoholic beverages for 24 hours.  ACTIVITY:  You should plan to take it easy for the rest of today and you should NOT DRIVE or use heavy machinery until tomorrow (because of the sedation medicines used during the test).    FOLLOW UP: Our staff will call the number listed on your records the next business day following your procedure to check on you and address any questions or concerns that you may have regarding the information given to you following your procedure. If we do not reach you, we will leave a message.  However, if you are feeling well and you are not experiencing any problems, there is no need to return our call.  We will assume that you have returned to your regular daily activities without incident.  If any biopsies were taken you will be contacted by phone or by letter within the next 1-3 weeks.  Please call us at [phone] if you have not heard about the biopsies in 3 weeks.    SIGNATURES/CONFIDENTIALITY: You and/or your care partner have signed paperwork which will be entered into your electronic medical record.  These signatures attest to the fact that that the information above on your After Visit Summary has been reviewed and is understood.  Full responsibility of the confidentiality of this discharge information lies with you and/or your care-partner.

## 2017-12-19 ENCOUNTER — Telehealth: Payer: Self-pay | Admitting: *Deleted

## 2017-12-19 NOTE — Telephone Encounter (Signed)
  Follow up Call-  Call back number 12/16/2017  Post procedure Call Back phone  # [phone]  Permission to leave phone message Yes  Some recent data might be hidden     Patient questions:  Do you have a fever, pain , or abdominal swelling? No. Pain Score  0 *  Have you tolerated food without any problems? Yes.    Have you been able to return to your normal activities? Yes.    Do you have any questions about your discharge instructions: Diet   No. Medications  No. Follow up visit  No.  Do you have questions or concerns about your Care? No.  Actions: * If pain score is 4 or above: No action needed, pain <4.

## 2017-12-30 ENCOUNTER — Ambulatory Visit
Admission: RE | Admit: 2017-12-30 | Discharge: 2017-12-30 | Disposition: A | Discharge status: Home / Self Care | Payer: PPO | Source: Ambulatory Visit | Attending: Obstetrics & Gynecology | Admitting: Obstetrics & Gynecology

## 2017-12-30 DIAGNOSIS — M818 Other osteoporosis without current pathological fracture: Secondary | ICD-10-CM

## 2017-12-30 DIAGNOSIS — M8589 Other specified disorders of bone density and structure, multiple sites: Secondary | ICD-10-CM | POA: Diagnosis not present

## 2017-12-30 DIAGNOSIS — Z780 Asymptomatic menopausal state: Secondary | ICD-10-CM | POA: Diagnosis not present

## 2018-01-02 ENCOUNTER — Encounter: Payer: Self-pay | Admitting: *Deleted

## 2018-02-01 ENCOUNTER — Other Ambulatory Visit: Payer: Self-pay | Admitting: Obstetrics & Gynecology

## 2018-02-01 DIAGNOSIS — Z1231 Encounter for screening mammogram for malignant neoplasm of breast: Secondary | ICD-10-CM

## 2018-03-02 DIAGNOSIS — L821 Other seborrheic keratosis: Secondary | ICD-10-CM | POA: Diagnosis not present

## 2018-03-02 DIAGNOSIS — D225 Melanocytic nevi of trunk: Secondary | ICD-10-CM | POA: Diagnosis not present

## 2018-03-02 DIAGNOSIS — D2261 Melanocytic nevi of right upper limb, including shoulder: Secondary | ICD-10-CM | POA: Diagnosis not present

## 2018-03-02 DIAGNOSIS — L814 Other melanin hyperpigmentation: Secondary | ICD-10-CM | POA: Diagnosis not present

## 2018-03-02 DIAGNOSIS — Z86018 Personal history of other benign neoplasm: Secondary | ICD-10-CM | POA: Diagnosis not present

## 2018-03-02 DIAGNOSIS — Z23 Encounter for immunization: Secondary | ICD-10-CM | POA: Diagnosis not present

## 2018-03-20 ENCOUNTER — Other Ambulatory Visit: Payer: Self-pay

## 2018-03-20 ENCOUNTER — Ambulatory Visit
Admission: RE | Admit: 2018-03-20 | Discharge: 2018-03-20 | Disposition: A | Discharge status: Home / Self Care | Payer: PPO | Source: Ambulatory Visit | Attending: Obstetrics & Gynecology | Admitting: Obstetrics & Gynecology

## 2018-03-20 DIAGNOSIS — Z1231 Encounter for screening mammogram for malignant neoplasm of breast: Secondary | ICD-10-CM | POA: Diagnosis not present

## 2018-04-12 NOTE — Telephone Encounter (Signed)
error 

## 2018-05-02 ENCOUNTER — Other Ambulatory Visit: Payer: Self-pay | Admitting: Family Medicine

## 2018-05-03 ENCOUNTER — Encounter: Payer: Self-pay | Admitting: Family Medicine

## 2018-05-03 ENCOUNTER — Telehealth: Payer: Self-pay | Admitting: *Deleted

## 2018-05-03 ENCOUNTER — Ambulatory Visit (INDEPENDENT_AMBULATORY_CARE_PROVIDER_SITE_OTHER): Payer: PPO | Admitting: Family Medicine

## 2018-05-03 ENCOUNTER — Other Ambulatory Visit: Payer: Self-pay

## 2018-05-03 VITALS — Resp 12

## 2018-05-03 DIAGNOSIS — Z0000 Encounter for general adult medical examination without abnormal findings: Secondary | ICD-10-CM | POA: Diagnosis not present

## 2018-05-03 DIAGNOSIS — E785 Hyperlipidemia, unspecified: Secondary | ICD-10-CM

## 2018-05-03 MED ORDER — SIMVASTATIN 10 MG PO TABS: ORAL_TABLET | ORAL | 3 refills | Status: DC

## 2018-05-03 NOTE — Telephone Encounter (Signed)
Patient scheduled f/u for 05/03/2018 for medication refills.

## 2018-05-03 NOTE — Progress Notes (Signed)
Virtual Visit via Video Note   I connected with@on  05/03/18 at  2:30 PM EDT by a video enabled telemedicine application and verified that I am speaking with the correct person using two identifiers.  Location patient: home Location provider:work or home office Persons participating in the virtual visit: patient, provider  I discussed the limitations of evaluation and management by telemedicine and the availability of in person appointments. The patient expressed understanding and agreed to proceed.   HPI:  Tanya [name] is being seen today to follow some of her chronic medical problems and she is also due for Medicare preventive visit. Last Medicare wellness exam was on 05/02/2017.  She lives with her husband, who currently is undergoing radiation treatment for oral cancer. Independent ADL's and IADL's. No falls in the past year and denies depression symptoms.  Functional Status Survey: Is the patient deaf or have difficulty hearing?: No Does the patient have difficulty seeing, even when wearing glasses/contacts?: No Does the patient have difficulty concentrating, remembering, or making decisions?: No Does the patient have difficulty walking or climbing stairs?: No Does the patient have difficulty dressing or bathing?: No Does the patient have difficulty doing errands alone such as visiting a doctor's office or shopping?: No  Fall Risk  05/03/2018 05/02/2017 04/26/2016 11/19/2013  Falls in the past year? 0 No No No  Number falls in past yr: 0 - - -  Injury with Fall? 0 - - -  Follow up Education provided - - -     Providers she sees regularly:  Eye care provider: Dr. Katy Fitch. Dr. Dellis Filbert, last seen in 03/2018. Dr. Jana Hakim, oncologist.  Last seen in 11/2017. Dr. Renda Rolls, dermatologist, annual follow-up.   Depression screen [facility] 2/9 05/03/2018  Decreased Interest 0  Down, Depressed, Hopeless 0  PHQ - 2 Score 0    Mini-Cog - 05/03/18 1452    Normal clock drawing test?  yes    How many words correct?  3       Vision Screening Comments: Virtual visit She has history of osteoporosis (follows with gynecologist), COPD, tobacco use, hyperlipidemia, and OA among some. S/P breast mastectomy with sentinel lymph node sampling in 08/2014. Last mammogram on 03/20/2018: Birads 1.  Still smoking,not planning on quitting. Currently she is going through some stress due to husband illness and current public concerns about [id number].  Hyperlipidemia: Currently she is on simvastatin 10 mg daily. She has tolerated medication well. She follows a healthful, low-fat diet.  Lab Results  Component Value Date   CHOL 183 08/01/2017   HDL 85.40 08/01/2017   LDLCALC 86 08/01/2017   LDLDIRECT 127.4 03/13/2012   TRIG 57.0 08/01/2017   CHOLHDL 2 08/01/2017    ROS: See pertinent positives and negatives per HPI. COVID-19 screening questions: Denies new fever,cough,sore throat,or possible exposure to COVID-19. Negative for loss in the sense of smell or taste.  Past Medical History:  Diagnosis Date  . Arthritis    left knee and hip  . Breast cancer ([place])    Radiation and lumpectomy  . COPD, mild ([place])   . Family history of breast cancer   . Family history of uterine cancer   . Hyperlipidemia   . Osteopenia    DEXA 10/27/11: -1.4, no change from 10/2009  . Shingles   . Smoker     Past Surgical History:  Procedure Laterality Date  . BREAST BIOPSY    . BREAST LUMPECTOMY    . COLONOSCOPY    . DILATION AND EVACUATION    .  MASTECTOMY Left AUG 2016  . TONSILLECTOMY AND ADENOIDECTOMY      Family History  Problem Relation Age of Onset  . CAD Mother        stent older age  . Hypertension Mother   . Breast cancer Mother 45  . Cancer Mother        SKIN and BLADDER   . Heart disease Paternal Grandfather   . Lung cancer Paternal Grandfather   . Hypertension Father   . Heart disease Father 50       CHF   . Dementia Father   . Hearing loss Maternal Grandfather   .  Parkinson's disease Paternal Grandmother   . Colon cancer Neg Hx   . Esophageal cancer Neg Hx   . Stomach cancer Neg Hx   . Rectal cancer Neg Hx     Social History   Socioeconomic History  . Marital status: Married    Spouse name: Not on file  . Number of children: 0  . Years of education: Not on file  . Highest education level: Not on file  Occupational History  . Not on file  Social Needs  . Financial resource strain: Not on file  . Food insecurity:    Worry: Not on file    Inability: Not on file  . Transportation needs:    Medical: Not on file    Non-medical: Not on file  Tobacco Use  . Smoking status: Current Every Day Smoker    Packs/day: 1.00    Years: 49.00    Pack years: 49.00    Types: Cigarettes  . Smokeless tobacco: Never Used  . Tobacco comment: Trying to smoke less.   Substance and Sexual Activity  . Alcohol use: Yes    Alcohol/week: 14.0 standard drinks    Types: 14 Standard drinks or equivalent per week    Comment: 5 DRINKS A WEEK  . Drug use: No  . Sexual activity: Never  Lifestyle  . Physical activity:    Days per week: Not on file    Minutes per session: Not on file  . Stress: Not on file  Relationships  . Social connections:    Talks on phone: Not on file    Gets together: Not on file    Attends religious service: Not on file    Active member of club or organization: Not on file    Attends meetings of clubs or organizations: Not on file    Relationship status: Not on file  . Intimate partner violence:    Fear of current or ex partner: Not on file    Emotionally abused: Not on file    Physically abused: Not on file    Forced sexual activity: Not on file  Other Topics Concern  . Not on file  Social History Narrative   Lives at home with husband who is disabled.        Current Outpatient Medications:  .  Calcium Carbonate-Vitamin D 600-400 MG-UNIT tablet, Take 1 tablet by mouth daily., Disp: , Rfl:  .  Cholecalciferol (VITAMIN D-1000  MAX ST) 1000 UNITS tablet, Take 1,000 Units by mouth., Disp: , Rfl:  .  ibuprofen (MOTRIN IB) 200 MG tablet, Take 1 tablet (200 mg total) by mouth every 6 (six) hours as needed., Disp: 30 tablet, Rfl: 0 .  Misc Natural Products (OSTEO BI-FLEX TRIPLE STRENGTH) TABS, Take 1 tablet by mouth 2 (two) times daily., Disp: , Rfl:  .  Multiple Vitamin (MULTIVITAMIN) tablet, Take  1 tablet by mouth daily., Disp: , Rfl:  .  simvastatin (ZOCOR) 10 MG tablet, TAKE 1 TABLET BY MOUTH EVERYDAY AT BEDTIME, Disp: 90 tablet, Rfl: 3  EXAM:  VITALS per patient if applicable:Resp 12   GENERAL: alert, oriented, appears well and in no acute distress  HEENT: atraumatic, normocephalic, conjunttiva clear, no obvious facial abnormalities on inspection.  NECK: normal movements of the head and neck  LUNGS: on inspection no signs of respiratory distress, breathing rate appears normal, no obvious gross SOB, gasping or wheezing  CV: no obvious cyanosis  MS: moves all visible extremities without noticeable abnormality  PSYCH/NEURO: pleasant and cooperative, no obvious depression or anxiety, speech and thought processing grossly intact  ASSESSMENT AND PLAN:  Discussed the following assessment and plan: Orders Placed This Encounter  Procedures  . Lipid panel  . Comprehensive metabolic panel    Medicare annual wellness visit, subsequent We discussed the importance of staying active, physically and mentally, as well as the benefits of a healthy/balance diet. Low impact exercise that involve stretching and strengthing are ideal. Vaccines :She needs pneumovax,will plan on given it in 07/2018. We discussed preventive screening for the next 5-10 years: Mammogram 03/2019 and DEXA 11/2018 or 2021,follows with gyn. Colonoscopy due in 12/2027 with Dr Hilarie Fredrickson. Smoking cessation encouraged. Lung cancer screening 06/2018. No FHx of AAA. Fall prevention.  Advance directives and end of life discussed, she has POA and living  will.    Hyperlipidemia Last lipid panel in 07/2017 improved with simvastatin 10 mg. No changes in current management. Continue low-fat diet. We will plan on repeating lipid panel in 07/2018.     I discussed the assessment and treatment plan with the patient. She was provided an opportunity to ask questions and all were answered. The patient agreed with the plan and demonstrated an understanding of the instructions.    Return in about 1 year (around 05/03/2019) for lab in 07/2018 and pneumovax..    Blaine Guiffre [name], MD

## 2018-05-03 NOTE — Telephone Encounter (Signed)
Spoke with pt regarding her message about having a pna vac. Stated to pt to let Dr. [name] know this was something she was requesting and it would be done the same time as labs if Dr. [name] felt like labs were needed. Pt acknowledged understanding.

## 2018-05-03 NOTE — Assessment & Plan Note (Signed)
Last lipid panel in 07/2017 improved with simvastatin 10 mg. No changes in current management. Continue low-fat diet. We will plan on repeating lipid panel in 07/2018.

## 2018-06-27 ENCOUNTER — Encounter: Payer: Self-pay | Admitting: Family Medicine

## 2018-07-04 DIAGNOSIS — H0102A Squamous blepharitis right eye, upper and lower eyelids: Secondary | ICD-10-CM | POA: Diagnosis not present

## 2018-07-04 DIAGNOSIS — H5203 Hypermetropia, bilateral: Secondary | ICD-10-CM | POA: Diagnosis not present

## 2018-07-04 DIAGNOSIS — H2513 Age-related nuclear cataract, bilateral: Secondary | ICD-10-CM | POA: Diagnosis not present

## 2018-07-04 DIAGNOSIS — H04123 Dry eye syndrome of bilateral lacrimal glands: Secondary | ICD-10-CM | POA: Diagnosis not present

## 2018-07-04 DIAGNOSIS — H524 Presbyopia: Secondary | ICD-10-CM | POA: Diagnosis not present

## 2018-07-04 DIAGNOSIS — H52203 Unspecified astigmatism, bilateral: Secondary | ICD-10-CM | POA: Diagnosis not present

## 2018-07-04 DIAGNOSIS — H0102B Squamous blepharitis left eye, upper and lower eyelids: Secondary | ICD-10-CM | POA: Diagnosis not present

## 2018-07-04 DIAGNOSIS — H1045 Other chronic allergic conjunctivitis: Secondary | ICD-10-CM | POA: Diagnosis not present

## 2018-07-14 ENCOUNTER — Other Ambulatory Visit: Payer: Self-pay | Admitting: *Deleted

## 2018-07-14 DIAGNOSIS — Z87891 Personal history of nicotine dependence: Secondary | ICD-10-CM

## 2018-07-14 DIAGNOSIS — F17210 Nicotine dependence, cigarettes, uncomplicated: Secondary | ICD-10-CM

## 2018-07-14 DIAGNOSIS — Z122 Encounter for screening for malignant neoplasm of respiratory organs: Secondary | ICD-10-CM

## 2018-07-18 ENCOUNTER — Encounter: Payer: PPO | Admitting: Obstetrics & Gynecology

## 2018-08-03 ENCOUNTER — Other Ambulatory Visit (INDEPENDENT_AMBULATORY_CARE_PROVIDER_SITE_OTHER): Payer: PPO

## 2018-08-03 ENCOUNTER — Other Ambulatory Visit: Payer: Self-pay

## 2018-08-03 DIAGNOSIS — Z23 Encounter for immunization: Secondary | ICD-10-CM

## 2018-08-03 DIAGNOSIS — E785 Hyperlipidemia, unspecified: Secondary | ICD-10-CM

## 2018-08-03 LAB — COMPREHENSIVE METABOLIC PANEL
ALT: 16 U/L (ref 0–35)
AST: 19 U/L (ref 0–37)
Albumin: 4.5 g/dL (ref 3.5–5.2)
Alkaline Phosphatase: 46 U/L (ref 39–117)
BUN: 18 mg/dL (ref 6–23)
CO2: 31 mEq/L (ref 19–32)
Calcium: 9.5 mg/dL (ref 8.4–10.5)
Chloride: 100 mEq/L (ref 96–112)
Creatinine, Ser: 0.72 mg/dL (ref 0.40–1.20)
GFR: 80.93 mL/min (ref >60.00)
Glucose, Bld: 93 mg/dL (ref 70–99)
Potassium: 4.1 mEq/L (ref 3.5–5.1)
Sodium: 139 mEq/L (ref 135–145)
Total Bilirubin: 0.6 mg/dL (ref 0.2–1.2)
Total Protein: 7.1 g/dL (ref 6.0–8.3)

## 2018-08-03 LAB — LIPID PANEL
Cholesterol: 174 mg/dL (ref 0–200)
HDL: 82.3 mg/dL (ref >39.00)
LDL Cholesterol: 77 mg/dL (ref 0–99)
NonHDL: 92.16
Total CHOL/HDL Ratio: 2
Triglycerides: 77 mg/dL (ref 0.0–149.0)
VLDL: 15.4 mg/dL (ref 0.0–40.0)

## 2018-08-06 ENCOUNTER — Encounter: Payer: Self-pay | Admitting: Family Medicine

## 2018-08-08 ENCOUNTER — Other Ambulatory Visit: Payer: Self-pay

## 2018-08-09 ENCOUNTER — Ambulatory Visit (INDEPENDENT_AMBULATORY_CARE_PROVIDER_SITE_OTHER): Payer: PPO | Admitting: Obstetrics & Gynecology

## 2018-08-09 ENCOUNTER — Encounter: Payer: Self-pay | Admitting: Obstetrics & Gynecology

## 2018-08-09 VITALS — BP 132/78 | Ht 64.0 in | Wt 118.0 lb

## 2018-08-09 DIAGNOSIS — Z780 Asymptomatic menopausal state: Secondary | ICD-10-CM

## 2018-08-09 DIAGNOSIS — Z01419 Encounter for gynecological examination (general) (routine) without abnormal findings: Secondary | ICD-10-CM | POA: Diagnosis not present

## 2018-08-09 DIAGNOSIS — M8589 Other specified disorders of bone density and structure, multiple sites: Secondary | ICD-10-CM

## 2018-08-09 DIAGNOSIS — Z170 Estrogen receptor positive status [ER+]: Secondary | ICD-10-CM

## 2018-08-09 DIAGNOSIS — F17210 Nicotine dependence, cigarettes, uncomplicated: Secondary | ICD-10-CM

## 2018-08-09 DIAGNOSIS — C50412 Malignant neoplasm of upper-outer quadrant of left female breast: Secondary | ICD-10-CM

## 2018-08-09 DIAGNOSIS — Z9289 Personal history of other medical treatment: Secondary | ICD-10-CM | POA: Diagnosis not present

## 2018-08-09 DIAGNOSIS — Z853 Personal history of malignant neoplasm of breast: Secondary | ICD-10-CM

## 2018-08-09 NOTE — Progress Notes (Signed)
Tanya [name] 09/18/1950 [id number]   History:    68 y.o. G3P0A3  RP:  Established patient presenting for annual gyn exam   HPI: Menopause, well on no HRT.  No PMB.  No pelvic pain. Abstinent. Urine/BMs wnl.  H/O Left recurrent Breast Cancer followed by Dr Jana Hakim.  Still smoking cigarettes, trying to decrease.  Chantix not tolerated in the past.  BMI 20.25.  BD 12/2017 Improved to Osteopenia, T-Score -1.7.  Took Alendronate in the past.   Left Breast Ca Dxed at age 56. Lumpectomy and sentinel lymph node biopsy for a left sided T1(mic) N0, stage IA invasive breast cancer. Had Radiation Therapy, then Tamoxifen for 5 years, then Raloxifene.  On 06/19/2014 Mammo showed calcifications in the left breast, Dx Lt mammography 06/26/2014 showing a 5 mm group of heterogeneous calcifications in the outer left breast. Bx on 07/04/2014 showed invasive and in situ ductal carcinoma, grade 2. ER 90% positive, PR 80% positive.  Seen at [facility] by Dr. Elisha Headland, a mastectomy with repeat sentinel lymph node sampling performed 08/08/2014.  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 Margins were negative. All 3 sentinel lymph nodes negative. Received Paclitaxel, Trastuzumab, Anastrozole until 2017.  Past medical history,surgical history, family history and social history were all reviewed and documented in the EPIC chart.  Gynecologic History No LMP recorded. Patient is postmenopausal. Contraception: abstinence and post menopausal status Last Pap: 06/2017. Results were: Negative Last mammogram: 03/2018.  Results were: Left mastectomy/Right negative Bone Density: 12/2017 Osteopenia T-Score -1.7 improved Colonoscopy: 12/2017  Obstetric History OB History  Gravida Para Term Preterm AB Living  3 0     2 0  SAB TAB Ectopic Multiple Live Births  1            # Outcome Date GA Lbr Len/2nd Weight Sex Delivery Anes PTL Lv  3 SAB           2 AB           1 Gravida               ROS: A ROS was performed and pertinent positives and negatives are included in the history.  GENERAL: No fevers or chills. HEENT: No change in vision, no earache, sore throat or sinus congestion. NECK: No pain or stiffness. CARDIOVASCULAR: No chest pain or pressure. No palpitations. PULMONARY: No shortness of breath, cough or wheeze. GASTROINTESTINAL: No abdominal pain, nausea, vomiting or diarrhea, melena or bright red blood per rectum. GENITOURINARY: No urinary frequency, urgency, hesitancy or dysuria. MUSCULOSKELETAL: No joint or muscle pain, no back pain, no recent trauma. DERMATOLOGIC: No rash, no itching, no lesions. ENDOCRINE: No polyuria, polydipsia, no heat or cold intolerance. No recent change in weight. HEMATOLOGICAL: No anemia or easy bruising or bleeding. NEUROLOGIC: No headache, seizures, numbness, tingling or weakness. PSYCHIATRIC: No depression, no loss of interest in normal activity or change in sleep pattern.     Exam:   BP 132/78   Ht 5\' 4"  (1.626 m)   Wt 118 lb (53.5 kg)   BMI 20.25 kg/m   Body mass index is 20.25 kg/m.  General appearance : Well developed well nourished female. No acute distress HEENT: Eyes: no retinal hemorrhage or exudates,  Neck supple, trachea midline, no carotid bruits, no thyroidmegaly Lungs: Clear to auscultation, no rhonchi or wheezes, or rib retractions  Heart: Regular rate and rhythm, no murmurs or gallops Breast:Examined in sitting and supine position.  S/P Left Mastectomy.  Rt  Breast:  No palpable masses or tenderness,  no skin retraction, no nipple inversion, no nipple discharge, no skin discoloration, no axillary or supraclavicular lymphadenopathy Abdomen: no palpable masses or tenderness, no rebound or guarding Extremities: no edema or skin discoloration or tenderness  Pelvic: Vulva: Normal             Vagina: No gross lesions or discharge  Cervix: No gross lesions or discharge  Uterus  AV, normal size, shape and consistency, non-tender  and mobile  Adnexa  Without masses or tenderness  Anus: Normal   Assessment/Plan:  68 y.o. female for annual exam   1. Well female exam with routine gynecological exam Normal gynecologic exam in menopause.  Pap test in June 2019 was negative, no indication to repeat this year.  Left breast cancer status post left mastectomy.  Mammogram March 2020 was negative on the right breast.  Body mass index 20.25.  Good fitness and healthy nutrition.  Health labs with family physician.  2. Malignant neoplasm of upper-outer quadrant of left breast in female, estrogen receptor positive ([place]) Left breast cancer status post left mastectomy followed by Dr. Jana Hakim.  3. Postmenopause Well on no hormone replacement therapy.  No postmenopausal bleeding.  4. Osteopenia of multiple sites History of osteoporosis on alendronate in the past.  Last bone density December 2019 showed only osteopenia with a T score of -1.7.  Continue with vitamin D supplements, calcium intake of 1200 mg daily and regular weightbearing physical activities.  5. Cigarette smoker Strongly recommended to quit smoking.  [name] MD, 11:14 AM 08/09/2018

## 2018-08-14 ENCOUNTER — Encounter: Payer: Self-pay | Admitting: Obstetrics & Gynecology

## 2018-08-14 NOTE — Patient Instructions (Signed)
1. Well female exam with routine gynecological exam Normal gynecologic exam in menopause.  Pap test in June 2019 was negative, no indication to repeat this year.  Left breast cancer status post left mastectomy.  Mammogram March 2020 was negative on the right breast.  Body mass index 20.25.  Good fitness and healthy nutrition.  Health labs with family physician.  2. Malignant neoplasm of upper-outer quadrant of left breast in female, estrogen receptor positive ([place]) Left breast cancer status post left mastectomy followed by Dr. Jana Hakim.  3. Postmenopause Well on no hormone replacement therapy.  No postmenopausal bleeding.  4. Osteopenia of multiple sites History of osteoporosis on alendronate in the past.  Last bone density December 2019 showed only osteopenia with a T score of -1.7.  Continue with vitamin D supplements, calcium intake of 1200 mg daily and regular weightbearing physical activities.  5. Cigarette smoker Strongly recommended to quit smoking.  Netanya, it was a pleasure seeing you today!

## 2018-08-25 ENCOUNTER — Ambulatory Visit (INDEPENDENT_AMBULATORY_CARE_PROVIDER_SITE_OTHER)
Admission: RE | Admit: 2018-08-25 | Discharge: 2018-08-25 | Disposition: A | Discharge status: Home / Self Care | Payer: PPO | Source: Ambulatory Visit | Attending: Acute Care | Admitting: Acute Care

## 2018-08-25 ENCOUNTER — Ambulatory Visit: Payer: PPO

## 2018-08-25 ENCOUNTER — Other Ambulatory Visit: Payer: Self-pay

## 2018-08-25 DIAGNOSIS — Z122 Encounter for screening for malignant neoplasm of respiratory organs: Secondary | ICD-10-CM | POA: Diagnosis not present

## 2018-08-25 DIAGNOSIS — F17210 Nicotine dependence, cigarettes, uncomplicated: Secondary | ICD-10-CM | POA: Diagnosis not present

## 2018-08-25 DIAGNOSIS — Z87891 Personal history of nicotine dependence: Secondary | ICD-10-CM

## 2018-08-29 ENCOUNTER — Telehealth: Payer: Self-pay | Admitting: Acute Care

## 2018-08-29 DIAGNOSIS — Z122 Encounter for screening for malignant neoplasm of respiratory organs: Secondary | ICD-10-CM

## 2018-08-29 DIAGNOSIS — F17210 Nicotine dependence, cigarettes, uncomplicated: Secondary | ICD-10-CM

## 2018-08-29 DIAGNOSIS — Z87891 Personal history of nicotine dependence: Secondary | ICD-10-CM

## 2018-08-29 NOTE — Telephone Encounter (Signed)
Pt informed of CT results per Sarah Groce, NP.  PT verbalized understanding.  Copy sent to PCP.  Order placed for 1 yr f/u CT.  

## 2018-10-20 ENCOUNTER — Encounter: Payer: Self-pay | Admitting: Family Medicine

## 2018-11-21 ENCOUNTER — Encounter: Payer: Self-pay | Admitting: Family Medicine

## 2018-11-21 NOTE — Progress Notes (Signed)
[facility]  Telephone:([phone]) [phone] Fax:(336) [phone]     ID: Tanya [name] DOB: [birth year]  MR#: [id number]  CSN#: [id number]  Patient Care Team: [name], [name], MD as PCP - General (Family Medicine) [name], MD (Inactive) (Gastroenterology) [name], MD (Inactive) (Obstetrics and Gynecology) [name], [name], MD as Consulting Physician (Oncology) [name], MD as Referring Physician (Surgery) [name], MD as Consulting Physician (Orthopedic Surgery) OTHER MD: [name] MD, [name] MD  CHIEF COMPLAINT: Estrogen receptor positive breast cancer (s/p left mastectomy)  CURRENT TREATMENT: Observation   INTERVAL HISTORY:  Tanya [name] returns today for follow-up of her estrogen receptor positive breast cancer.  She continues under observation alone.  Unfortunately her husband Tanya [name] died 11-08-2018, just a couple of weeks ago.  He had head and neck cancer, treated with radiation, did not want chemotherapy.  That had hospice at home, and there was support from his family, but he was very difficult.  The patient has grieving appropriately.  Since her last visit, she underwent bone density screening on 12/30/2017. This showed a T-score of -1.7, which is mild to moderately osteopenic.  She also right screening mammography with tomography at [facility] on 03/20/2018 showing: breast density category C; no evidence of malignancy.  She also underwent [name] cancer screening chest CT on 08/25/2018, which showed no evidence of malignancy   REVIEW OF SYSTEMS: [name] continues to smoke.  She walks her dog for exercise.  Otherwise right now she is going to be staying in her home, since she has good neighbors, and she understands that for the first year after may major loss it is better to put off other make decisions.  He is taking appropriate pandemic precautions.  A detailed review of systems today was otherwise stable.    BREAST CANCER HISTORY: From the original intake note:  At the age of 61, the patient underwent lumpectomy and sentinel lymph node biopsy for a left sided T1(mic) N0, stage IA invasive breast cancer. She completed whole breast irradiation and received tamoxifen for 5 years. She has continued on raloxifene.  More recently, screening mammography at the breast Center with tomography 06/19/2014 showed calcifications in the left breast, which were further evaluated with diagnostic left mammography 06/26/2014. Breast density was category C. There was a 5 mm group of heterogeneous calcifications in the outer left breast. This was biopsied 07/04/2014, and showed ([id number]) invasive and in situ ductal carcinoma, grade 2. There was a minimal amount of invasive tumor, but it did allow for prognostic panel, which showed the cancer to be estrogen receptor 90% positive, progesterone receptor 80% positive, both with strong staining intensity, with an MIB-1 of 80%, and HER-2 amplified, with a signals ratio of 8.09, the number per cell being 8.90.  The patient then was evaluated at [facility] by Dr. Elisha Headland and, given the prior history of radiation, mastectomy with repeat sentinel lymph node sampling was suggested. This was performed 08/08/2014, and showed ([id number]) invasive ductal carcinoma, grade 1, measuring 0.3 cm. Margins were negative. All 3 sentinel lymph nodes sampled were negative. Repeat prognostic panel was obtained on the DCIS only, and found the tumor to be estrogen receptor positive at 65% and progesterone receptor positive at 75%.  The patient's subsequent history is as detailed below.    PAST MEDICAL HISTORY: Past Medical History:  Diagnosis Date  . Arthritis    left knee and hip  . Breast cancer ([place])  Radiation and lumpectomy  . COPD, mild ([place])   . Family history of breast cancer   . Family history of uterine cancer   . Hyperlipidemia   . Osteopenia    DEXA 10/27/11: -1.4,  no change from 10/2009  . Shingles   . Smoker     PAST SURGICAL HISTORY: Past Surgical History:  Procedure Laterality Date  . BREAST BIOPSY    . BREAST LUMPECTOMY    . COLONOSCOPY    . DILATION AND EVACUATION    . MASTECTOMY Left AUG 2016  . TONSILLECTOMY AND ADENOIDECTOMY      FAMILY HISTORY Family History  Problem Relation Age of Onset  . CAD Mother        stent older age  . Hypertension Mother   . Breast cancer Mother 73  . Cancer Mother        SKIN and BLADDER   . Heart disease Paternal Grandfather   . [name] cancer Paternal Grandfather   . Hypertension Father   . Heart disease Father 71       CHF   . Dementia Father   . Hearing loss Maternal Grandfather   . Parkinson's disease Paternal Grandmother   . Colon cancer Neg Hx   . Esophageal cancer Neg Hx   . Stomach cancer Neg Hx   . Rectal cancer Neg Hx   The patient's father died with Alzheimer's disease at age 43. The patient's mother was diagnosed with breast cancer at age 16. She died from C. difficile colitis at age 42. The patient has one brother, one sister. There is no history of breast or ovarian cancer in the immediate family.   GYNECOLOGIC HISTORY:  No LMP recorded. Patient is postmenopausal. Menarche age 64, the patient is GX P0. She underwent menopause approximately the year 2000. She did not take hormone replacement. 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 Her husband, Tanya [name], used to be in Press photographer (Set designer) but had 2 back fusions and became disabled.  He died from head and neck cancer October 2020. At  home is just the patient plus her dog Tanya [name]    ADVANCED DIRECTIVES: In place; the patient's friend Tanya [name] is a healthcare power of attorney.  She can be reached at [place]: Social History   Tobacco Use  . Smoking status:  Current Every Day Smoker    Packs/day: 1.00    Years: 49.00    Pack years: 49.00    Types: Cigarettes  . Smokeless tobacco: Never Used  . Tobacco comment: Trying to smoke less.   Substance Use Topics  . Alcohol use: Yes    Alcohol/week: 14.0 standard drinks    Types: 14 Standard drinks or equivalent per week    Comment: 5 DRINKS A WEEK  . Drug use: No     Colonoscopy: November 2009  PAP: 06/30/2017-- normal  Bone density: November 2017: -2.5  Lipid panel: 08/01/2017-- normal  Allergies  Allergen Reactions  . Codeine     REACTION: Rash  . Tetanus Toxoid     REACTION: Shock   Current Outpatient Medications  Medication Sig Dispense Refill  . ibuprofen (MOTRIN IB) 200 MG tablet Take 1 tablet (200 mg total) by mouth every 6 (six) hours as needed. 30 tablet 0  . Misc Natural Products (OSTEO BI-FLEX TRIPLE STRENGTH) TABS Take 1 tablet  by mouth 2 (two) times daily.    . Multiple Vitamin (MULTIVITAMIN) tablet Take 1 tablet by mouth daily.    . simvastatin (ZOCOR) 10 MG tablet TAKE 1 TABLET BY MOUTH EVERYDAY AT BEDTIME 90 tablet 3   No current facility-administered medications for this visit.     OBJECTIVE: Middle-aged white woman who appears stated age  29:   11/22/18 1000  BP: 121/62  Pulse: 74  Resp: 17  Temp: 98.5 F (36.9 C)  TempSrc: Temporal  SpO2: 100%  Weight: 117 lb 6.4 oz (53.3 kg)  Height: '5\' 4"'  (1.626 m)     ECOG FS:1 - Symptomatic but completely ambulatory  Sclerae unicteric, EOMs intact Wearing a mask No cervical or supraclavicular adenopathy Lungs no rales or rhonchi Heart regular rate and rhythm Abd soft, nontender, positive bowel sounds MSK no focal spinal tenderness, no upper extremity lymphedema Neuro: nonfocal, well oriented, grieving appropriately Breasts: The right breast is unremarkable.  Status post left mastectomy with no evidence of local recurrence.  Both axillae are benign.   LAB RESULTS:  CBC    Component Value Date/Time    WBC 5.3 11/22/2018 0931   RBC 4.56 11/22/2018 0931   HGB 15.4 (H) 11/22/2018 0931   HGB 15.5 11/08/2016 1131   HCT 46.0 11/22/2018 0931   HCT 45.4 11/08/2016 1131   PLT 187 11/22/2018 0931   PLT 176 11/08/2016 1131   MCV 100.9 (H) 11/22/2018 0931   MCV 99.1 11/08/2016 1131   MCH 33.8 11/22/2018 0931   MCHC 33.5 11/22/2018 0931   RDW 13.2 11/22/2018 0931   RDW 13.2 11/08/2016 1131   LYMPHSABS 1.5 11/22/2018 0931   LYMPHSABS 1.7 11/08/2016 1131   MONOABS 0.5 11/22/2018 0931   MONOABS 0.5 11/08/2016 1131   EOSABS 0.1 11/22/2018 0931   EOSABS 0.0 11/08/2016 1131   BASOSABS 0.0 11/22/2018 0931   BASOSABS 0.0 11/08/2016 1131   CMP Latest Ref Rng & Units 11/22/2018 08/03/2018 11/15/2017  Glucose 70 - 99 mg/dL 86 93 80  BUN 8 - 23 mg/dL '20 18 20  ' Creatinine 0.44 - 1.00 mg/dL 0.78 0.72 0.72  Sodium 135 - 145 mmol/L 139 139 139  Potassium 3.5 - 5.1 mmol/L 4.7 4.1 4.3  Chloride 98 - 111 mmol/L 100 100 101  CO2 22 - 32 mmol/L '29 31 29  ' Calcium 8.9 - 10.3 mg/dL 9.1 9.5 9.6  Total Protein 6.5 - 8.1 g/dL 7.0 7.1 7.0  Total Bilirubin 0.3 - 1.2 mg/dL 0.4 0.6 0.6  Alkaline Phos 38 - 126 U/L 49 46 54  AST 15 - 41 U/L '20 19 22  ' ALT 0 - 44 U/L '20 16 18   ' Urinalysis    Component Value Date/Time   COLORURINE YELLOW 04/03/2014 1059   APPEARANCEUR CLEAR 04/03/2014 1059   LABSPEC <=1.005 (A) 04/03/2014 1059   PHURINE 7.0 04/03/2014 1059   GLUCOSEU NEGATIVE 04/03/2014 1059   HGBUR TRACE-INTACT (A) 04/03/2014 1059   BILIRUBINUR NEGATIVE 04/03/2014 1059   KETONESUR NEGATIVE 04/03/2014 1059   PROTEINUR NEG 06/08/2013 1016   UROBILINOGEN 0.2 04/03/2014 1059   NITRITE NEGATIVE 04/03/2014 1059   LEUKOCYTESUR NEGATIVE 04/03/2014 1059    STUDIES: No results found.   ASSESSMENT: 68 y.o. [place] woman  (1) status post left Breast upper outer quadrant lumpectomy and sentinel lymph node sampling 1999 for a T1(mic) N0, stage IA invasive breast cancer,  (a) status post adjuvant radiation  (b)  status post tamoxifen for 5 years  (2) status post left breast  biopsy 07/04/2014 for a clinical T1a N0, stage IA invasive ductal carcinoma, triple positive, with an MIB-1 of 80%--the patient was on raloxifene at the time  (3) status post left mastectomy and sentinel lymph node sampling 08/06/2014 for a pT1a pN0, stage IA invasive ductal carcinoma, grade 2, with negative margins. There was insufficient tissue to repeat HER-2 testing  (4) adjuvant chemotherapy started 09/16/2014 consisting of paclitaxel weekly 12 with trastuzumab given every 3 weeks, continuing the trastuzumab alone to complete 1 year, but stopped 12/16/2014 for financial reasons  (a) completed 12 doses of paclitaxel 12/02/2014.  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  (7) tobacco abuse: The patient has been strongly advised to quit  (a) Wellbutrin prescribed 11/11/2014, not started by patient   (b) unable to tolerate Chantix  PLAN: Shaleta is now just over 4 years out from definitive surgery for breast cancer with no evidence of disease recurrence.  This is very favorable.  We discussed her recent loss.  She is grieving appropriately.  She is not planning any major life decisions right now.  She continues to smoke.  This is making her hemoglobin a little above normal.  She did have her chest CT screening for [name] cancer this year and that was benign.  She will have mammography next year as scheduled.  She will see me again in a year.  I will be her "graduation visit".  She knows to call for any other issues that may develop  before the next visit.  [name], [name], MD  11/22/18 10:44 AM Medical Oncology and Hematology [facility] [place], [place] [address] Tel. [phone]    Fax. [phone]   I, Wilburn Mylar am acting as scribe for Dr. [name]. [name].  I, Lurline Del MD, have reviewed the above documentation for accuracy and completeness, and I agree with the above.

## 2018-11-22 ENCOUNTER — Telehealth: Payer: Self-pay | Admitting: Oncology

## 2018-11-22 ENCOUNTER — Inpatient Hospital Stay: Payer: PPO | Attending: Oncology

## 2018-11-22 ENCOUNTER — Other Ambulatory Visit: Payer: Self-pay

## 2018-11-22 ENCOUNTER — Inpatient Hospital Stay (HOSPITAL_BASED_OUTPATIENT_CLINIC_OR_DEPARTMENT_OTHER): Payer: PPO | Admitting: Oncology

## 2018-11-22 VITALS — BP 121/62 | HR 74 | Temp 98.5°F | Resp 17 | Ht 64.0 in | Wt 117.4 lb

## 2018-11-22 DIAGNOSIS — Z9012 Acquired absence of left breast and nipple: Secondary | ICD-10-CM | POA: Insufficient documentation

## 2018-11-22 DIAGNOSIS — Z8049 Family history of malignant neoplasm of other genital organs: Secondary | ICD-10-CM | POA: Insufficient documentation

## 2018-11-22 DIAGNOSIS — F17210 Nicotine dependence, cigarettes, uncomplicated: Secondary | ICD-10-CM | POA: Diagnosis not present

## 2018-11-22 DIAGNOSIS — M8580 Other specified disorders of bone density and structure, unspecified site: Secondary | ICD-10-CM | POA: Insufficient documentation

## 2018-11-22 DIAGNOSIS — Z170 Estrogen receptor positive status [ER+]: Secondary | ICD-10-CM | POA: Insufficient documentation

## 2018-11-22 DIAGNOSIS — C50412 Malignant neoplasm of upper-outer quadrant of left female breast: Secondary | ICD-10-CM

## 2018-11-22 DIAGNOSIS — Z822 Family history of deafness and hearing loss: Secondary | ICD-10-CM | POA: Insufficient documentation

## 2018-11-22 DIAGNOSIS — Z79899 Other long term (current) drug therapy: Secondary | ICD-10-CM | POA: Diagnosis not present

## 2018-11-22 DIAGNOSIS — Z885 Allergy status to narcotic agent status: Secondary | ICD-10-CM | POA: Diagnosis not present

## 2018-11-22 DIAGNOSIS — Z8249 Family history of ischemic heart disease and other diseases of the circulatory system: Secondary | ICD-10-CM | POA: Insufficient documentation

## 2018-11-22 DIAGNOSIS — Z801 Family history of malignant neoplasm of trachea, bronchus and lung: Secondary | ICD-10-CM | POA: Diagnosis not present

## 2018-11-22 DIAGNOSIS — Z853 Personal history of malignant neoplasm of breast: Secondary | ICD-10-CM | POA: Diagnosis not present

## 2018-11-22 DIAGNOSIS — Z818 Family history of other mental and behavioral disorders: Secondary | ICD-10-CM | POA: Diagnosis not present

## 2018-11-22 DIAGNOSIS — Z803 Family history of malignant neoplasm of breast: Secondary | ICD-10-CM | POA: Insufficient documentation

## 2018-11-22 DIAGNOSIS — Z7289 Other problems related to lifestyle: Secondary | ICD-10-CM | POA: Diagnosis not present

## 2018-11-22 LAB — CBC WITH DIFFERENTIAL/PLATELET
Abs Immature Granulocytes: 0.01 10*3/uL (ref 0.00–0.07)
Basophils Absolute: 0 10*3/uL (ref 0.0–0.1)
Basophils Relative: 0 %
Eosinophils Absolute: 0.1 10*3/uL (ref 0.0–0.5)
Eosinophils Relative: 2 %
HCT: 46 % (ref 36.0–46.0)
Hemoglobin: 15.4 g/dL — ABNORMAL HIGH (ref 12.0–15.0)
Immature Granulocytes: 0 %
Lymphocytes Relative: 28 %
Lymphs Abs: 1.5 10*3/uL (ref 0.7–4.0)
MCH: 33.8 pg (ref 26.0–34.0)
MCHC: 33.5 g/dL (ref 30.0–36.0)
MCV: 100.9 fL — ABNORMAL HIGH (ref 80.0–100.0)
Monocytes Absolute: 0.5 10*3/uL (ref 0.1–1.0)
Monocytes Relative: 9 %
Neutro Abs: 3.3 10*3/uL (ref 1.7–7.7)
Neutrophils Relative %: 61 %
Platelets: 187 10*3/uL (ref 150–400)
RBC: 4.56 MIL/uL (ref 3.87–5.11)
RDW: 13.2 % (ref 11.5–15.5)
WBC: 5.3 10*3/uL (ref 4.0–10.5)
nRBC: 0 % (ref 0.0–0.2)

## 2018-11-22 LAB — COMPREHENSIVE METABOLIC PANEL
ALT: 20 U/L (ref 0–44)
AST: 20 U/L (ref 15–41)
Albumin: 4.2 g/dL (ref 3.5–5.0)
Alkaline Phosphatase: 49 U/L (ref 38–126)
Anion gap: 10 (ref 5–15)
BUN: 20 mg/dL (ref 8–23)
CO2: 29 mmol/L (ref 22–32)
Calcium: 9.1 mg/dL (ref 8.9–10.3)
Chloride: 100 mmol/L (ref 98–111)
Creatinine, Ser: 0.78 mg/dL (ref 0.44–1.00)
GFR calc Af Amer: >60 mL/min (ref >60)
GFR calc non Af Amer: >60 mL/min (ref >60)
Glucose, Bld: 86 mg/dL (ref 70–99)
Potassium: 4.7 mmol/L (ref 3.5–5.1)
Sodium: 139 mmol/L (ref 135–145)
Total Bilirubin: 0.4 mg/dL (ref 0.3–1.2)
Total Protein: 7 g/dL (ref 6.5–8.1)

## 2018-11-22 NOTE — Telephone Encounter (Signed)
I left a message regarding schedule  

## 2018-12-11 DIAGNOSIS — L821 Other seborrheic keratosis: Secondary | ICD-10-CM | POA: Diagnosis not present

## 2018-12-11 DIAGNOSIS — M7130 Other bursal cyst, unspecified site: Secondary | ICD-10-CM | POA: Diagnosis not present

## 2019-02-01 ENCOUNTER — Ambulatory Visit: Payer: PPO

## 2019-02-06 ENCOUNTER — Other Ambulatory Visit: Payer: Self-pay | Admitting: Obstetrics & Gynecology

## 2019-02-06 DIAGNOSIS — Z1231 Encounter for screening mammogram for malignant neoplasm of breast: Secondary | ICD-10-CM

## 2019-02-09 ENCOUNTER — Ambulatory Visit: Payer: PPO | Attending: Internal Medicine

## 2019-02-09 DIAGNOSIS — Z23 Encounter for immunization: Secondary | ICD-10-CM | POA: Insufficient documentation

## 2019-02-09 NOTE — Progress Notes (Signed)
   Covid-19 Vaccination Clinic  Name:  Tanya [name]    MRN: [id number] DOB: 1950/07/19  02/09/2019  [name] was observed post Covid-19 immunization for 30 minutes based on pre-vaccination screening without incidence. She was provided with Vaccine Information Sheet and instruction to access the V-Safe system.   [name] was instructed to call 911 with any severe reactions post vaccine: Marland Kitchen Difficulty breathing  . Swelling of your face and throat  . A fast heartbeat  . A bad rash all over your body  . Dizziness and weakness    Immunizations Administered    Name Date Dose VIS Date Route   Pfizer COVID-19 Vaccine 02/09/2019  3:46 PM 0.3 mL 12/15/2018 Intramuscular   Manufacturer: [place]   Lot: [id number]   [place]: [id number]

## 2019-02-12 ENCOUNTER — Ambulatory Visit: Payer: PPO

## 2019-03-05 DIAGNOSIS — L570 Actinic keratosis: Secondary | ICD-10-CM | POA: Diagnosis not present

## 2019-03-05 DIAGNOSIS — Z23 Encounter for immunization: Secondary | ICD-10-CM | POA: Diagnosis not present

## 2019-03-05 DIAGNOSIS — D2261 Melanocytic nevi of right upper limb, including shoulder: Secondary | ICD-10-CM | POA: Diagnosis not present

## 2019-03-05 DIAGNOSIS — Z86018 Personal history of other benign neoplasm: Secondary | ICD-10-CM | POA: Diagnosis not present

## 2019-03-05 DIAGNOSIS — L578 Other skin changes due to chronic exposure to nonionizing radiation: Secondary | ICD-10-CM | POA: Diagnosis not present

## 2019-03-05 DIAGNOSIS — L821 Other seborrheic keratosis: Secondary | ICD-10-CM | POA: Diagnosis not present

## 2019-03-05 DIAGNOSIS — L814 Other melanin hyperpigmentation: Secondary | ICD-10-CM | POA: Diagnosis not present

## 2019-03-05 DIAGNOSIS — D225 Melanocytic nevi of trunk: Secondary | ICD-10-CM | POA: Diagnosis not present

## 2019-03-07 ENCOUNTER — Ambulatory Visit: Payer: PPO | Attending: Internal Medicine

## 2019-03-07 DIAGNOSIS — Z23 Encounter for immunization: Secondary | ICD-10-CM | POA: Insufficient documentation

## 2019-03-07 NOTE — Progress Notes (Signed)
   Covid-19 Vaccination Clinic  Name:  Tanya [name]    MRN: [id number] DOB: Sep 04, 1950  03/07/2019  [name] was observed post Covid-19 immunization for 30 minutes based on pre-vaccination screening without incident. She was provided with Vaccine Information Sheet and instruction to access the V-Safe system.   [name] was instructed to call 911 with any severe reactions post vaccine: Marland Kitchen Difficulty breathing  . Swelling of face and throat  . A fast heartbeat  . A bad rash all over body  . Dizziness and weakness   Immunizations Administered    Name Date Dose VIS Date Route   Pfizer COVID-19 Vaccine 03/07/2019  9:20 AM 0.3 mL 12/15/2018 Intramuscular   Manufacturer: [place]   Lot: [id number]   [place]: [id number]

## 2019-03-21 ENCOUNTER — Ambulatory Visit
Admission: RE | Admit: 2019-03-21 | Discharge: 2019-03-21 | Disposition: A | Discharge status: Home / Self Care | Payer: PPO | Source: Ambulatory Visit | Attending: Obstetrics & Gynecology | Admitting: Obstetrics & Gynecology

## 2019-03-21 DIAGNOSIS — Z1231 Encounter for screening mammogram for malignant neoplasm of breast: Secondary | ICD-10-CM

## 2019-07-15 NOTE — Progress Notes (Signed)
HPI: Tanya [name] is a 68 y.o. female, who is here today for her routine physical. She was last seen on 05/03/19,virtual visit.  Independent ADL's and IADL's. no falls in the past year and denies depression symptoms.  Functional Status Survey: Is the patient deaf or have difficulty hearing?: No Does the patient have difficulty seeing, even when wearing glasses/contacts?: No Does the patient have difficulty concentrating, remembering, or making decisions?: No Does the patient have difficulty walking or climbing stairs?: No Does the patient have difficulty dressing or bathing?: No Does the patient have difficulty doing errands alone such as visiting a doctor's office or shopping?: No  Fall Risk  07/16/2019 05/03/2018 05/02/2017 04/26/2016 11/19/2013  Falls in the past year? 0 0 No No No  Number falls in past yr: - 0 - - -  Injury with Fall? - 0 - - -  Follow up - Education provided - - -   Providers she sees regularly: Eye care provider: Dr. Katy Fitch. Dr. Dellis Filbert, last seen in 03/2018. Dr. Jana Hakim, oncologist.  Last seen in 11/2017.Dx'ed with breast cancer in 2016.Completed chemo in 11/2014.  Arimidex caused peripheral neuropathy, improved.  Dr. Renda Rolls, dermatologist, annual follow-up.  Depression screen PHQ 2/9 07/16/2019  Decreased Interest 0  Down, Depressed, Hopeless 0  PHQ - 2 Score 0     Hearing Screening   '125Hz'  '250Hz'  '500Hz'  '1000Hz'  '2000Hz'  '3000Hz'  '4000Hz'  '6000Hz'  '8000Hz'   Right ear:           Left ear:             Visual Acuity Screening   Right eye Left eye Both eyes  Without correction:     With correction: '20/20 20/20 20/20 '    Last CPE: 05/03/2018.  Regular exercise 3 or more: Recently started doing Tai chi. She is active with chores around her house and yard work.. She just started  Following a healthy diet: yes. She doe snot cook often,tries to select healthier choices when she eats out. She lives alone. Her husband passed away after a battle with mouth  cancer.In general she is dealing well with her loss but sometimes she feels lonely.  Chronic medical problems: Osteopenia,left breast cancer,COPD,HLD,and OA among some.  She follows with gyn, Dr Dellis Filbert.  Immunization History  Administered Date(s) Administered  . Influenza Split 10/05/2011  . Influenza Whole 09/04/2008  . Influenza, High Dose Seasonal PF 10/18/2017, 10/09/2018  . Influenza,inj,Quad PF,6+ Mos 10/10/2012, 09/18/2013, 12/16/2014, 10/13/2015, 09/15/2016  . PFIZER SARS-COV-2 Vaccination 02/09/2019, 03/07/2019  . Pneumococcal Conjugate-13 05/02/2017  . Pneumococcal Polysaccharide-23 11/04/2008, 08/03/2018  . Zoster 03/17/2012  . Zoster Recombinat (Shingrix) 05/10/2017, 08/03/2017   Mammogram: 03/21/19. Colonoscopy: 12/16/17. DEXA: 12/30/17.  Hep C screening : NR on 06/08/13.  She has no concerns today. HLD: She is on Simvastatin 10 mg daily. Aortic atherosclerosis seen on imaging.  Lab Results  Component Value Date   CHOL 174 08/03/2018   HDL 82.30 08/03/2018   LDLCALC 77 08/03/2018   LDLDIRECT 127.4 03/13/2012   TRIG 77.0 08/03/2018   CHOLHDL 2 08/03/2018   Lab Results  Component Value Date   ALT 20 11/22/2018   AST 20 11/22/2018   ALKPHOS 49 11/22/2018   BILITOT 0.4 11/22/2018   COPD: Negative for cough,wheezing,or SOB. + Smoker, not really to quit. Lung cancer screening done on 08/25/18. Next one schedule for 08/2020.  Review of Systems  Constitutional: Negative for activity change, appetite change, fatigue, fever and unexpected weight change.  HENT: Negative for  mouth sores, nosebleeds and sore throat.   Eyes: Negative for redness and visual disturbance.  Respiratory: Negative for cough, shortness of breath and wheezing.   Cardiovascular: Negative for chest pain, palpitations and leg swelling.  Gastrointestinal: Negative for abdominal pain, nausea and vomiting.       Negative for changes in bowel habits.  Genitourinary: Negative for decreased urine  volume, dysuria and hematuria.  Musculoskeletal: Positive for arthralgias (Mianly left hip).  Neurological: Positive for dizziness (Spinning sensation intermittent x 2 years,stable.). Negative for syncope, weakness and headaches.  Psychiatric/Behavioral: Negative for confusion.  All other systems reviewed and are negative.  Current Outpatient Medications on File Prior to Visit  Medication Sig Dispense Refill  . Calcium Carbonate-Vitamin D 600-400 MG-UNIT tablet Take 1 tablet by mouth in the morning and at bedtime.    . Cholecalciferol (VITAMIN D) 50 MCG (2000 UT) CAPS Take 1 capsule by mouth daily.    Marland Kitchen ibuprofen (MOTRIN IB) 200 MG tablet Take 1 tablet (200 mg total) by mouth every 6 (six) hours as needed. 30 tablet 0  . Misc Natural Products (OSTEO BI-FLEX TRIPLE STRENGTH) TABS Take 1 tablet by mouth 2 (two) times daily.    . Multiple Vitamin (MULTIVITAMIN) tablet Take 1 tablet by mouth daily.    . simvastatin (ZOCOR) 10 MG tablet TAKE 1 TABLET BY MOUTH EVERYDAY AT BEDTIME 90 tablet 3   No current facility-administered medications on file prior to visit.   Past Medical History:  Diagnosis Date  . Arthritis    left knee and hip  . Breast cancer ([place])    Radiation and lumpectomy  . COPD, mild ([place])   . Family history of breast cancer   . Family history of uterine cancer   . Hyperlipidemia   . Osteopenia    DEXA 10/27/11: -1.4, no change from 10/2009  . Shingles   . Smoker     Past Surgical History:  Procedure Laterality Date  . BREAST BIOPSY    . BREAST LUMPECTOMY    . COLONOSCOPY    . DILATION AND EVACUATION    . MASTECTOMY Left AUG 2016  . TONSILLECTOMY AND ADENOIDECTOMY      Allergies  Allergen Reactions  . Codeine     REACTION: Rash  . Tetanus Toxoid     REACTION: Shock    Family History  Problem Relation Age of Onset  . CAD Mother        stent older age  . Hypertension Mother   . Breast cancer Mother 62  . Cancer Mother        SKIN and BLADDER   . Heart  disease Paternal Grandfather   . Lung cancer Paternal Grandfather   . Hypertension Father   . Heart disease Father 57       CHF   . Dementia Father   . Hearing loss Maternal Grandfather   . Parkinson's disease Paternal Grandmother   . Colon cancer Neg Hx   . Esophageal cancer Neg Hx   . Stomach cancer Neg Hx   . Rectal cancer Neg Hx     Social History   Socioeconomic History  . Marital status: Widowed    Spouse name: Not on file  . Number of children: 0  . Years of education: Not on file  . Highest education level: Not on file  Occupational History  . Not on file  Tobacco Use  . Smoking status: Current Every Day Smoker    Packs/day: 1.00  Years: 49.00    Pack years: 49.00    Types: Cigarettes  . Smokeless tobacco: Never Used  . Tobacco comment: Trying to smoke less.   Vaping Use  . Vaping Use: Never used  Substance and Sexual Activity  . Alcohol use: Yes    Alcohol/week: 14.0 standard drinks    Types: 14 Standard drinks or equivalent per week    Comment: 5 DRINKS A WEEK  . Drug use: No  . Sexual activity: Never  Other Topics Concern  . Not on file  Social History Narrative   Lives at home with husband who is disabled.     Social Determinants of Health   Financial Resource Strain:   . Difficulty of Paying Living Expenses:   Food Insecurity:   . Worried About Charity fundraiser in the Last Year:   . Arboriculturist in the Last Year:   Transportation Needs:   . Film/video editor (Medical):   Marland Kitchen Lack of Transportation (Non-Medical):   Physical Activity:   . Days of Exercise per Week:   . Minutes of Exercise per Session:   Stress:   . Feeling of Stress :   Social Connections:   . Frequency of Communication with Friends and Family:   . Frequency of Social Gatherings with Friends and Family:   . Attends Religious Services:   . Active Member of Clubs or Organizations:   . Attends Archivist Meetings:   Marland Kitchen Marital Status:    Vitals:    07/16/19 0915  BP: 124/70  Pulse: 88  Resp: 12  Temp: 98 F (36.7 C)  SpO2: 99%   Body mass index is 21.03 kg/m.  Wt Readings from Last 3 Encounters:  07/16/19 122 lb 8 oz (55.6 kg)  11/22/18 117 lb 6.4 oz (53.3 kg)  08/09/18 118 lb (53.5 kg)   Physical Exam Vitals and nursing note reviewed.  Constitutional:      General: She is not in acute distress.    Appearance: She is well-developed and normal weight.  HENT:     Head: Normocephalic and atraumatic.     Right Ear: Hearing, tympanic membrane, ear canal and external ear normal.     Left Ear: Hearing, tympanic membrane, ear canal and external ear normal.     Mouth/Throat:     Pharynx: Uvula midline.  Eyes:     Conjunctiva/sclera: Conjunctivae normal.     Pupils: Pupils are equal, round, and reactive to light.  Neck:     Thyroid: No thyroid mass or thyromegaly.     Vascular: No carotid bruit.     Trachea: No tracheal deviation.  Cardiovascular:     Rate and Rhythm: Normal rate and regular rhythm.     Pulses:          Dorsalis pedis pulses are 2+ on the right side and 2+ on the left side.     Heart sounds: No murmur heard.      Comments: Varicose veins LE,bilateral. Pulmonary:     Effort: Pulmonary effort is normal. No respiratory distress.     Breath sounds: Normal breath sounds.  Abdominal:     Palpations: Abdomen is soft. There is no hepatomegaly or mass.     Tenderness: There is no abdominal tenderness.  Genitourinary:    Comments: Deferred to gyn. Musculoskeletal:     Comments: No signs of synovitis appreciated.  Lymphadenopathy:     Cervical: No cervical adenopathy.     Upper Body:  Right upper body: No supraclavicular adenopathy.     Left upper body: No supraclavicular adenopathy.  Skin:    General: Skin is warm.     Findings: No erythema or rash.  Neurological:     Mental Status: She is alert and oriented to person, place, and time.     Cranial Nerves: No cranial nerve deficit.     Coordination:  Coordination normal.     Gait: Gait normal.     Deep Tendon Reflexes:     Reflex Scores:      Bicep reflexes are 2+ on the right side and 2+ on the left side.      Patellar reflexes are 2+ on the right side and 2+ on the left side. Psychiatric:        Mood and Affect: Affect is labile.        Speech: Speech normal.     Comments: Well groomed, good eye contact.    ASSESSMENT AND PLAN:  Ms. BRYNJA MARKER was here today annual physical examination.  Orders Placed This Encounter  Procedures  . Lipid panel   Lab Results  Component Value Date   CHOL 209 (H) 07/16/2019   HDL 90 07/16/2019   LDLCALC 101 (H) 07/16/2019   LDLDIRECT 127.4 03/13/2012   TRIG 90 07/16/2019   CHOLHDL 2.3 07/16/2019   Routine general medical examination at a health care facility We discussed the importance of regular physical activity and healthy diet for prevention of chronic illness and/or complications. Preventive guidelines reviewed. Ca++ and vit D supplementation to continue. Next CPE in a year.  Hyperlipidemia, unspecified hyperlipidemia type Continue Simvastatin 10 mg daily. Further recommendations according to lipid panel results.  Medicare annual wellness visit, subsequent We discussed the importance of staying active, physically and mentally, as well as the benefits of a healthy/balance diet. Low impact exercise that involve stretching and strengthing are ideal. Vaccines up to date. We discussed preventive screening for the next 5-10 years, summery of recommendations given in AVS. Fall prevention.  Advance directives and end of life discussed, she has POA and living will.  Chemotherapy-induced neuropathy ([place]) Problem has improved with time.  Chronic obstructive pulmonary disease, unspecified COPD type ([place]) Asymptomatic. She is not ready to quit smoking.  Aortic atherosclerosis ([facility]) Continue Simvastatin 10 mg. Aspirin 81 mg daily, she has no contraindications.  Return in about  1 year (around 07/15/2020) for cpe and awv.   Romaine Maciolek G. [name], MD  [facility]. [place] office.

## 2019-07-16 ENCOUNTER — Encounter: Payer: Self-pay | Admitting: Family Medicine

## 2019-07-16 ENCOUNTER — Ambulatory Visit (INDEPENDENT_AMBULATORY_CARE_PROVIDER_SITE_OTHER): Payer: PPO | Admitting: Family Medicine

## 2019-07-16 ENCOUNTER — Other Ambulatory Visit: Payer: Self-pay

## 2019-07-16 VITALS — BP 124/70 | HR 88 | Temp 98.0°F | Resp 12 | Ht 64.0 in | Wt 122.5 lb

## 2019-07-16 DIAGNOSIS — I700 Atherosclerosis of aorta: Secondary | ICD-10-CM

## 2019-07-16 DIAGNOSIS — E785 Hyperlipidemia, unspecified: Secondary | ICD-10-CM | POA: Diagnosis not present

## 2019-07-16 DIAGNOSIS — T451X5A Adverse effect of antineoplastic and immunosuppressive drugs, initial encounter: Secondary | ICD-10-CM

## 2019-07-16 DIAGNOSIS — Z0000 Encounter for general adult medical examination without abnormal findings: Secondary | ICD-10-CM

## 2019-07-16 DIAGNOSIS — J449 Chronic obstructive pulmonary disease, unspecified: Secondary | ICD-10-CM

## 2019-07-16 DIAGNOSIS — G620 Drug-induced polyneuropathy: Secondary | ICD-10-CM | POA: Diagnosis not present

## 2019-07-16 NOTE — Patient Instructions (Addendum)
  Tanya [name] , Thank you for taking time to come for your Medicare Wellness Visit. I appreciate your ongoing commitment to your health goals. Please review the following plan we discussed and let me know if I can assist you in the future.   These are the goals we discussed: Goals    . Prevent falls     Regular low impact physical activity. Tai chi is a very good option.        This is a list of the screening recommended for you and due dates:  Health Maintenance  Topic Date Due  . Flu Shot  08/05/2019  . Mammogram  03/20/2021  . Colon Cancer Screening  12/17/2027  . DEXA scan (bone density measurement)  Completed  . COVID-19 Vaccine  Completed  .  Hepatitis C: One time screening is recommended by Center for Disease Control  (CDC) for  adults born from [birth year] through 1963.   Completed  . Pneumonia vaccines  Completed     A few tips:  -As we age balance is not as good as it was, so there is a higher risks for falls. Please remove small rugs and furniture that is "in your way" and could increase the risk of falls. Stretching exercises may help with fall prevention: Yoga and Tai Chi are some examples. Low impact exercise is better, so you are not very achy the next day.  -Sun screen and avoidance of direct sun light recommended. Caution with dehydration, if working outdoors be sure to drink enough fluids.  - Some medications are not safe as we age, increases the risk of side effects and can potentially interact with other medication you are also taken;  including some of over the counter medications. Be sure to let me know when you start a new medication even if it is a dietary/vitamin supplement.   -Healthy diet low in red meet/animal fat and sugar + regular physical activity is recommended.     Screening schedule for the next 5-10 years:  Colonoscopy  Glaucoma screening/eye exam every 1-2 years.  Mammogram annually.  DEXA Flu vaccine annually.  Diabetes  screening    Fall prevention

## 2019-07-17 LAB — LIPID PANEL
Cholesterol: 209 mg/dL — ABNORMAL HIGH (ref <200)
HDL: 90 mg/dL (ref >=50)
LDL Cholesterol (Calc): 101 mg/dL (calc) — ABNORMAL HIGH
Non-HDL Cholesterol (Calc): 119 mg/dL (calc) (ref <130)
Total CHOL/HDL Ratio: 2.3 (calc) (ref <5.0)
Triglycerides: 90 mg/dL (ref <150)

## 2019-07-23 DIAGNOSIS — H04123 Dry eye syndrome of bilateral lacrimal glands: Secondary | ICD-10-CM | POA: Diagnosis not present

## 2019-07-23 DIAGNOSIS — H0102B Squamous blepharitis left eye, upper and lower eyelids: Secondary | ICD-10-CM | POA: Diagnosis not present

## 2019-07-23 DIAGNOSIS — H5203 Hypermetropia, bilateral: Secondary | ICD-10-CM | POA: Diagnosis not present

## 2019-07-23 DIAGNOSIS — H524 Presbyopia: Secondary | ICD-10-CM | POA: Diagnosis not present

## 2019-07-23 DIAGNOSIS — H0102A Squamous blepharitis right eye, upper and lower eyelids: Secondary | ICD-10-CM | POA: Diagnosis not present

## 2019-07-23 DIAGNOSIS — H2513 Age-related nuclear cataract, bilateral: Secondary | ICD-10-CM | POA: Diagnosis not present

## 2019-07-23 DIAGNOSIS — H1045 Other chronic allergic conjunctivitis: Secondary | ICD-10-CM | POA: Diagnosis not present

## 2019-07-23 DIAGNOSIS — D492 Neoplasm of unspecified behavior of bone, soft tissue, and skin: Secondary | ICD-10-CM | POA: Diagnosis not present

## 2019-07-24 ENCOUNTER — Other Ambulatory Visit: Payer: Self-pay

## 2019-07-24 MED ORDER — SIMVASTATIN 10 MG PO TABS: ORAL_TABLET | ORAL | 1 refills | Status: DC

## 2019-08-21 ENCOUNTER — Other Ambulatory Visit: Payer: Self-pay

## 2019-08-21 ENCOUNTER — Ambulatory Visit (INDEPENDENT_AMBULATORY_CARE_PROVIDER_SITE_OTHER): Payer: PPO | Admitting: Obstetrics & Gynecology

## 2019-08-21 ENCOUNTER — Encounter: Payer: Self-pay | Admitting: Obstetrics & Gynecology

## 2019-08-21 VITALS — BP 140/80 | Ht 64.0 in | Wt 124.0 lb

## 2019-08-21 DIAGNOSIS — Z780 Asymptomatic menopausal state: Secondary | ICD-10-CM

## 2019-08-21 DIAGNOSIS — Z9289 Personal history of other medical treatment: Secondary | ICD-10-CM

## 2019-08-21 DIAGNOSIS — Z124 Encounter for screening for malignant neoplasm of cervix: Secondary | ICD-10-CM

## 2019-08-21 DIAGNOSIS — F17210 Nicotine dependence, cigarettes, uncomplicated: Secondary | ICD-10-CM

## 2019-08-21 DIAGNOSIS — Z01419 Encounter for gynecological examination (general) (routine) without abnormal findings: Secondary | ICD-10-CM

## 2019-08-21 DIAGNOSIS — C50412 Malignant neoplasm of upper-outer quadrant of left female breast: Secondary | ICD-10-CM

## 2019-08-21 DIAGNOSIS — Z853 Personal history of malignant neoplasm of breast: Secondary | ICD-10-CM | POA: Diagnosis not present

## 2019-08-21 DIAGNOSIS — Z716 Tobacco abuse counseling: Secondary | ICD-10-CM | POA: Diagnosis not present

## 2019-08-21 DIAGNOSIS — M8589 Other specified disorders of bone density and structure, multiple sites: Secondary | ICD-10-CM

## 2019-08-21 NOTE — Addendum Note (Signed)
Addended by: Thurnell Garbe A on: 08/21/2019 04:31 PM   Modules accepted: Orders

## 2019-08-21 NOTE — Progress Notes (Signed)
Tanya [name] [birth year] [id number]   History:    69 y.o. G3P0A3.  Husband passed away 11/04/2018.  RP:  Established patient presenting for annual gyn exam   HPI: Menopause, well on no HRT. No PMB. No pelvic pain. Abstinent. Urine/BMs wnl. H/O Left recurrent Breast Cancer followed by Dr Jana Hakim.  S/P Left Mastectomy. Still smoking cigarettes, trying to decrease. Chantix not tolerated in the past. BMI 21.28. BD 12/2017 Improved to Osteopenia, T-Score -1.7.  Took Alendronate in the past.   Past medical history,surgical history, family history and social history were all reviewed and documented in the EPIC chart.  Gynecologic History No LMP recorded. Patient is postmenopausal.  Obstetric History OB History  Gravida Para Term Preterm AB Living  3 0     2 0  SAB TAB Ectopic Multiple Live Births  1            # Outcome Date GA Lbr Len/2nd Weight Sex Delivery Anes PTL Lv  3 SAB           2 AB           1 Gravida              ROS: A ROS was performed and pertinent positives and negatives are included in the history.  GENERAL: No fevers or chills. HEENT: No change in vision, no earache, sore throat or sinus congestion. NECK: No pain or stiffness. CARDIOVASCULAR: No chest pain or pressure. No palpitations. PULMONARY: No shortness of breath, cough or wheeze. GASTROINTESTINAL: No abdominal pain, nausea, vomiting or diarrhea, melena or bright red blood per rectum. GENITOURINARY: No urinary frequency, urgency, hesitancy or dysuria. MUSCULOSKELETAL: No joint or muscle pain, no back pain, no recent trauma. DERMATOLOGIC: No rash, no itching, no lesions. ENDOCRINE: No polyuria, polydipsia, no heat or cold intolerance. No recent change in weight. HEMATOLOGICAL: No anemia or easy bruising or bleeding. NEUROLOGIC: No headache, seizures, numbness, tingling or weakness. PSYCHIATRIC: No depression, no loss of interest in normal activity or change in sleep pattern.     Exam:   BP 140/80   Ht  5\' 4"  (1.626 m)   Wt 124 lb (56.2 kg)   BMI 21.28 kg/m   Body mass index is 21.28 kg/m.  General appearance : Well developed well nourished female. No acute distress HEENT: Eyes: no retinal hemorrhage or exudates,  Neck supple, trachea midline, no carotid bruits, no thyroidmegaly Lungs: Clear to auscultation, no rhonchi or wheezes, or rib retractions  Heart: Regular rate and rhythm, no murmurs or gallops Breast:Examined in sitting and supine position.  S/P Left Mastectomy.  Right breast: no palpable masses or tenderness,  no skin retraction, no nipple inversion, no nipple discharge, no skin discoloration.  No axillary or supraclavicular lymphadenopathy bilaterally. Abdomen: no palpable masses or tenderness, no rebound or guarding Extremities: no edema or skin discoloration or tenderness  Pelvic: Vulva: Normal             Vagina: No gross lesions or discharge  Cervix: No gross lesions or discharge.  Pap reflex done.  Uterus  AV, normal size, shape and consistency, non-tender and mobile  Adnexa  Without masses or tenderness  Anus: Normal   Assessment/Plan:  69 y.o. female for annual exam   1. Encounter for routine gynecological examination with Papanicolaou smear of cervix Normal gynecologic exam.  Pap reflex done.  Breast exam status post left mastectomy.  Per patient, last visit with Dr. Jana Hakim this year for recurrent left breast cancer.  Colonoscopy 2019.  Health labs with family physician.  Body mass index 21.28.  Increase walking.  Continue with healthy nutrition.  2. 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  Last visit with Dr. Jana Hakim this year.  3. Postmenopause Well on no HRT.  No PMB.  4. Osteopenia of multiple sites Last bone density December 2019 showed osteopenia with a T score at -1.7.  We will repeat a bone density at the breast center in December this year.  Continue with vitamin D 2000  international unit daily and calcium intake of 1200 to 1500 mg daily.  Regular weightbearing physical activity is recommended. - DG Bone Density; Future  5. Cigarette smoker Strongly recommend to quit smoking.  [name] MD, 2:06 PM 08/21/2019

## 2019-08-22 LAB — PAP IG W/ RFLX HPV ASCU

## 2019-08-24 ENCOUNTER — Encounter: Payer: Self-pay | Admitting: Family Medicine

## 2019-08-29 ENCOUNTER — Other Ambulatory Visit: Payer: Self-pay

## 2019-08-29 ENCOUNTER — Ambulatory Visit (INDEPENDENT_AMBULATORY_CARE_PROVIDER_SITE_OTHER)
Admission: RE | Admit: 2019-08-29 | Discharge: 2019-08-29 | Disposition: A | Discharge status: Home / Self Care | Payer: PPO | Source: Ambulatory Visit | Attending: Acute Care | Admitting: Acute Care

## 2019-08-29 DIAGNOSIS — Z122 Encounter for screening for malignant neoplasm of respiratory organs: Secondary | ICD-10-CM

## 2019-08-29 DIAGNOSIS — Z87891 Personal history of nicotine dependence: Secondary | ICD-10-CM | POA: Diagnosis not present

## 2019-08-29 DIAGNOSIS — F17210 Nicotine dependence, cigarettes, uncomplicated: Secondary | ICD-10-CM

## 2019-09-06 ENCOUNTER — Encounter: Payer: Self-pay | Admitting: Family Medicine

## 2019-09-06 NOTE — Progress Notes (Signed)
Please call patient and let them  know their  low dose Ct was read as a Lung RADS 2: nodules that are benign in appearance and behavior with a very low likelihood of becoming a clinically active cancer due to size or lack of growth. Recommendation per radiology is for a repeat LDCT in 12 months. .Please let them  know we will order and schedule their  annual screening scan for 08/2020. Please let them  know there was notation of CAD on their  scan.  Please remind the patient  that this is a non-gated exam therefore degree or severity of disease  cannot be determined. Please have them  follow up with their PCP regarding potential risk factor modification, dietary therapy or pharmacologic therapy if clinically indicated. Pt.  is  currently on statin therapy. Please place order for annual  screening scan for  08/2020 and fax results to PCP. Thanks so much. 

## 2019-09-07 ENCOUNTER — Other Ambulatory Visit: Payer: Self-pay | Admitting: *Deleted

## 2019-09-07 DIAGNOSIS — Z87891 Personal history of nicotine dependence: Secondary | ICD-10-CM

## 2019-09-07 DIAGNOSIS — F17210 Nicotine dependence, cigarettes, uncomplicated: Secondary | ICD-10-CM

## 2019-10-09 ENCOUNTER — Encounter: Payer: Self-pay | Admitting: Family Medicine

## 2019-10-23 DIAGNOSIS — D492 Neoplasm of unspecified behavior of bone, soft tissue, and skin: Secondary | ICD-10-CM | POA: Diagnosis not present

## 2019-11-13 ENCOUNTER — Ambulatory Visit: Payer: PPO | Admitting: Obstetrics & Gynecology

## 2019-11-13 ENCOUNTER — Encounter: Payer: Self-pay | Admitting: Obstetrics & Gynecology

## 2019-11-13 ENCOUNTER — Other Ambulatory Visit: Payer: Self-pay

## 2019-11-13 VITALS — BP 128/78

## 2019-11-13 DIAGNOSIS — L292 Pruritus vulvae: Secondary | ICD-10-CM

## 2019-11-13 MED ORDER — CLOBETASOL PROPIONATE 0.05 % EX OINT: 1.0000 "application " | TOPICAL_OINTMENT | Freq: Two times a day (BID) | CUTANEOUS | 1 refills | Status: AC

## 2019-11-13 NOTE — Progress Notes (Signed)
    Tanya [name] April 24, 1950 [id number]        69 y.o.  G3P0020   RP: Right vulvar itching x 3 days  HPI: Right vulvar itching x 3 days.  Mild relief with Hydrocortisone 1%.  No lesion seen.  No vaginal d/c.  No PMB.  Abstinent.  Urine/BMs normal.   OB History  Gravida Para Term Preterm AB Living  3 0     2 0  SAB TAB Ectopic Multiple Live Births  1            # Outcome Date GA Lbr Len/2nd Weight Sex Delivery Anes PTL Lv  3 SAB           2 AB           1 Gravida             Past medical history,surgical history, problem list, medications, allergies, family history and social history were all reviewed and documented in the EPIC chart.   Directed ROS with pertinent positives and negatives documented in the history of present illness/assessment and plan.  Exam:  Vitals:   11/13/19 1422  BP: 128/78   General appearance:  Normal  Gynecologic exam: Vulva:  Very mild erythema at right vulva, no discrete lesion.  Postmenopausal atrophy present, but no whitening.  Speculum:  Cervix/Vagina normal.  Secretions normal.  Wet prep done.  Wet prep: Negative   Assessment/Plan:  69 y.o. G3P0020   1. Vulvar itching Mild local contact dermatitis in a background of postmenopausal atrophy of the vulva.  Wet prep negative.  Patient abstinent.  Decision to treat with clobetasol ointment 0.05%.  Apply a thin layer on the affected area twice a day for 7 days and then wean.  Prescription sent to pharmacy. - WET PREP FOR [place], YEAST, CLUE  Other orders - clobetasol ointment (TEMOVATE) 0.05 %; Apply 1 application topically 2 (two) times daily for 7 days.  [name] MD, 3:00 PM 11/13/2019

## 2019-11-14 LAB — WET PREP FOR TRICH, YEAST, CLUE

## 2019-11-18 ENCOUNTER — Encounter: Payer: Self-pay | Admitting: Obstetrics & Gynecology

## 2019-11-21 NOTE — Progress Notes (Signed)
Tanya [name]  Telephone:([phone]) [phone] Fax:(336) [phone]     ID: Tanya [name] DOB: 11-16-1950  MR#: [id number]  CSN#: [id number]  Patient Care Team: [name], [name], MD as PCP - General (Family Medicine) Catherin Doorn, [name], MD as Consulting Physician (Oncology) Howard-McNatt, Mable Fill, MD as Referring Physician (Surgery) [name], MD as Consulting Physician (Orthopedic Surgery) [name], MD as Consulting Physician (Obstetrics and Gynecology) OTHER MD: [name] MD, [name] MD  CHIEF COMPLAINT: Estrogen receptor positive breast cancer (s/p left mastectomy)  CURRENT TREATMENT: Observation   INTERVAL HISTORY:  Tanya [name] returns today for follow-up of her estrogen receptor positive breast cancer.  She continues under observation alone.  Since her last visit, she underwent right screening mammography with tomography at [facility] on 03/21/2019 showing: breast density category C; no evidence of malignancy.  She also underwent [name] cancer screening chest CT on 08/29/2019 showing: [name]-RADS 2, benign appearance or behavior.  She has a bone density scheduled for 01/01/2020   REVIEW OF SYSTEMS: Tanya [name] just returned from a family trip where she celebrated her sister 67th birthday in [place].  She unfortunately has not been able to quit smoking yet.  A detailed review of systems was otherwise stable   COVID 19 VACCINATION STATUS: Status post Pfizer x3 with the booster October 2021   BREAST CANCER HISTORY: From the original intake note:  At the age of 69, the patient underwent lumpectomy and sentinel lymph node biopsy for a left sided T1(mic) N0, stage IA invasive breast cancer. She completed whole breast irradiation and received tamoxifen for 5 years. She has continued on raloxifene.  More recently, screening mammography at the breast Center with tomography 06/19/2014 showed calcifications in the left breast, which were further evaluated with  diagnostic left mammography 06/26/2014. Breast density was category C. There was a 5 mm group of heterogeneous calcifications in the outer left breast. This was biopsied 07/04/2014, and showed ([id number]) invasive and in situ ductal carcinoma, grade 2. There was a minimal amount of invasive tumor, but it did allow for prognostic panel, which showed the cancer to be estrogen receptor 90% positive, progesterone receptor 80% positive, both with strong staining intensity, with an MIB-1 of 80%, and HER-2 amplified, with a signals ratio of 8.09, the number per cell being 8.90.  The patient then was evaluated at [facility] by Dr. Elisha Headland and, given the prior history of radiation, mastectomy with repeat sentinel lymph node sampling was suggested. This was performed 08/08/2014, and showed ([id number]) invasive ductal carcinoma, grade 1, measuring 0.3 cm. Margins were negative. All 3 sentinel lymph nodes sampled were negative. Repeat prognostic panel was obtained on the DCIS only, and found the tumor to be estrogen receptor positive at 65% and progesterone receptor positive at 75%.  The patient's subsequent history is as detailed below.    PAST MEDICAL HISTORY: Past Medical History:  Diagnosis Date  . Arthritis    left knee and hip  . Breast cancer ([place])    Radiation and lumpectomy  . COPD, mild ([place])   . Family history of breast cancer   . Family history of uterine cancer   . Hyperlipidemia   . Osteopenia    DEXA 10/27/11: -1.4, no change from 10/2009  . Shingles   . Smoker     PAST SURGICAL HISTORY: Past Surgical History:  Procedure Laterality Date  . BREAST BIOPSY    . BREAST LUMPECTOMY    . COLONOSCOPY    . DILATION  AND EVACUATION    . MASTECTOMY Left AUG 02-08-2014  . TONSILLECTOMY AND ADENOIDECTOMY      FAMILY HISTORY Family History  Problem Relation Age of Onset  . CAD Mother        stent older age  . Hypertension Mother   . Breast cancer Mother 25  . Cancer Mother         SKIN and BLADDER   . Heart disease Paternal Grandfather   . [name] cancer Paternal Grandfather   . Hypertension Father   . Heart disease Father 50       CHF   . Dementia Father   . Hearing loss Maternal Grandfather   . Parkinson's disease Paternal Grandmother   . Colon cancer Neg Hx   . Esophageal cancer Neg Hx   . Stomach cancer Neg Hx   . Rectal cancer Neg Hx   The patient's father died with Alzheimer's disease at age 32. The patient's mother was diagnosed with breast cancer at age 43. She died from C. difficile colitis at age 108. The patient has one brother, one sister. There is no history of breast or ovarian cancer in the immediate family.   GYNECOLOGIC HISTORY:  No LMP recorded. Patient is postmenopausal. Menarche age 34, the patient is GX P0. She underwent menopause approximately the year 1998-02-08. She did not take hormone replacement. 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 Her husband, Tanya [name], used to be in Press photographer (Set designer) but had 2 back fusions and became disabled.  He died from head and neck cancer October 2020. At  home is just the patient, her dog Tanya [name] having died in February 09, 2019  ADVANCED DIRECTIVES: In place; the patient's friend Tanya [name] is a healthcare power of attorney.  She can be reached at [place]: Social History   Tobacco Use  . Smoking status: Current Every Day Smoker    Packs/day: 1.00    Years: 49.00    Pack years: 49.00    Types: Cigarettes  . Smokeless tobacco: Never Used  . Tobacco comment: Trying to smoke less.   Vaping Use  . Vaping Use: Never used  Substance Use Topics  . Alcohol use: Yes    Alcohol/week: 14.0 standard drinks    Types: 14 Standard drinks or equivalent per week    Comment: 5 DRINKS A WEEK  . Drug use: No     Colonoscopy: November 2009  PAP: 06/30/2017--  normal  Bone density: November 2017: -2.5  Lipid panel: 08/01/2017-- normal  Allergies  Allergen Reactions  . Codeine     REACTION: Rash  . Tetanus Toxoid     REACTION: Shock   Current Outpatient Medications  Medication Sig Dispense Refill  . Calcium Carbonate-Vitamin D 600-400 MG-UNIT tablet Take 1 tablet by mouth in the morning and at bedtime.    . Cholecalciferol (VITAMIN D) 50 MCG (2000 UT) CAPS Take 1 capsule by mouth daily.    Marland Kitchen ibuprofen (MOTRIN IB) 200 MG tablet Take 1 tablet (200 mg total) by mouth every 6 (six) hours as needed. 30 tablet 0  . Misc Natural Products (OSTEO BI-FLEX TRIPLE STRENGTH) TABS Take 1 tablet by mouth 2 (two) times daily.    . Multiple Vitamin (MULTIVITAMIN) tablet Take 1 tablet by mouth daily.    . simvastatin (ZOCOR) 10 MG tablet TAKE 1 TABLET BY  MOUTH EVERYDAY AT BEDTIME 90 tablet 1   No current facility-administered medications for this visit.    OBJECTIVE: White woman in no acute distress  Vitals:   11/22/19 1004  BP: 125/67  Pulse: 78  Resp: 18  Temp: 98.1 F (36.7 C)  TempSrc: Tympanic  SpO2: 98%  Weight: 126 lb 3.2 oz (57.2 kg)  Height: 5' 4" (1.626 m)     ECOG FS:1 - Symptomatic but completely ambulatory  Sclerae unicteric, EOMs intact Wearing a mask No cervical or supraclavicular adenopathy Lungs no rales or rhonchi Heart regular rate and rhythm Abd soft, nontender, positive bowel sounds MSK no focal spinal tenderness, no upper extremity lymphedema Neuro: nonfocal, well oriented, appropriate affect Breasts: The right breast is benign.  The left breast is status post mastectomy.  There is no evidence of local recurrence.  Both axillae are benign.   LAB RESULTS:  CBC    Component Value Date/Time   WBC 5.0 11/22/2019 0951   WBC 5.3 11/22/2018 0931   RBC 4.53 11/22/2019 0951   HGB 15.1 (H) 11/22/2019 0951   HGB 15.5 11/08/2016 1131   HCT 44.5 11/22/2019 0951   HCT 45.4 11/08/2016 1131   PLT 188 11/22/2019 0951   PLT  176 11/08/2016 1131   MCV 98.2 11/22/2019 0951   MCV 99.1 11/08/2016 1131   MCH 33.3 11/22/2019 0951   MCHC 33.9 11/22/2019 0951   RDW 13.1 11/22/2019 0951   RDW 13.2 11/08/2016 1131   LYMPHSABS 1.5 11/22/2019 0951   LYMPHSABS 1.7 11/08/2016 1131   MONOABS 0.4 11/22/2019 0951   MONOABS 0.5 11/08/2016 1131   EOSABS 0.1 11/22/2019 0951   EOSABS 0.0 11/08/2016 1131   BASOSABS 0.0 11/22/2019 0951   BASOSABS 0.0 11/08/2016 1131   CMP Latest Ref Rng & Units 11/22/2019 11/22/2018 08/03/2018  Glucose 70 - 99 mg/dL 83 86 93  BUN 8 - 23 mg/dL _0 Creatinine 0.44 - 1.00 mg/dL 0.77 0.78 0.72  Sodium 135 - 145 mmol/L 138 139 139  Potassium 3.5 - 5.1 mmol/L 4.5 4.7 4.1  Chloride 98 - 111 mmol/L 99 100 100  CO2 22 - 32 mmol/L 33(H) 29 31  Calcium 8.9 - 10.3 mg/dL 9.6 9.1 9.5  Total Protein 6.5 - 8.1 g/dL 7.3 7.0 7.1  Total Bilirubin 0.3 - 1.2 mg/dL 0.5 0.4 0.6  Alkaline Phos 38 - 126 U/L 51 49 46  AST 15 - 41 U/L _1 ALT 0 - 44 U/L _2 Urinalysis    Component Value Date/Time   COLORURINE YELLOW 04/03/2014 1059   APPEARANCEUR CLEAR 04/03/2014 1059   LABSPEC <=1.005 (A) 04/03/2014 1059   PHURINE 7.0 04/03/2014 1059   GLUCOSEU NEGATIVE 04/03/2014 1059   HGBUR TRACE-INTACT (A) 04/03/2014 1059   BILIRUBINUR NEGATIVE 04/03/2014 1059   KETONESUR NEGATIVE 04/03/2014 1059   PROTEINUR NEG 06/08/2013 1016   UROBILINOGEN 0.2 04/03/2014 1059   NITRITE NEGATIVE 04/03/2014 1059   LEUKOCYTESUR NEGATIVE 04/03/2014 1059    STUDIES: No results found.   ASSESSMENT: 69 y.o. [place] woman  (1) status post left Breast upper outer quadrant lumpectomy and sentinel lymph node sampling 1999 for a T1(mic) N0, stage IA invasive breast cancer,  (a) status post adjuvant radiation  (b) status post tamoxifen for 5 years  (2) status post left breast biopsy 07/04/2014 for a clinical T1a N0, stage IA invasive ductal carcinoma, triple positive, with an MIB-1 of 80%--the patient was on  raloxifene at the  time  (a) given the 17-year interval between this and the earlier cancer, this likely was a new primary  (3) status post left mastectomy and sentinel lymph node sampling 08/06/2014 for a pT1a pN0, stage IA invasive ductal carcinoma, grade 2, with negative margins. There was insufficient tissue to repeat HER-2 testing  (4) adjuvant chemotherapy started 09/16/2014 consisting of paclitaxel weekly 12 with trastuzumab given every 3 weeks, continuing the trastuzumab alone to complete 1 year, but stopped 12/16/2014 for financial reasons  (a) completed 12 doses of paclitaxel 12/02/2014.  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  (7) tobacco abuse: The patient has been strongly advised to quit  (a) Wellbutrin prescribed 11/11/2014, not started by patient   (b) unable to tolerate Chantix  (c) CT chest lungs cancer screening 08/29/2019 negative   PLAN: Tanya [name] is now more than 5 years out from definitive surgery for her breast cancer with no evidence of disease recurrence.  This is very favorable.  She is trying to switch some funds to her retirement account but was not allowed to partly because she continues to smoke and partly because her second breast cancer was listed as a recurrence.  It really is not a recurrence as it occurred 17 years after the first 1.  It is a second primary.  I was glad to clarify that in her notes above and I gave her a copy of that.  Hopefully the economic incentive will further motivate her to quit smoking.  She will benefit  from yearly screening low dose chest CTs given her her [name] cancer risk.  At this point I feel comfortable releasing her from breast cancer follow-up.  All she will need as far as that is concerned is her yearly mammography and yearly physician breast exam, which she obtained through Dr. Assunta Curtis office  I will be glad to see Tanya [name] at any point in the future if and when the need arises but as of now are making no further routine appointments for her here.  Total encounter time 25 minutes.*  Tanya [name], [name], MD  11/22/19 10:32 AM Medical Oncology and Hematology [facility] [address] [place], [place] [address] Tel. [phone]    Fax. [phone]   I, Wilburn Mylar am acting as scribe for Dr. [name]. Tore Carreker.  I, Lurline Del MD, have reviewed the above documentation for accuracy and completeness, and I agree with the above.   *Total Encounter Time as defined by the Centers for Medicare and Medicaid Services includes, in addition to the face-to-face time of a patient visit (documented in the note above) non-face-to-face time: obtaining and reviewing outside history, ordering and reviewing medications, tests or procedures, care coordination (communications with other health care professionals or caregivers) and documentation in the medical record.

## 2019-11-22 ENCOUNTER — Encounter: Payer: Self-pay | Admitting: *Deleted

## 2019-11-22 ENCOUNTER — Inpatient Hospital Stay: Payer: PPO | Attending: Oncology | Admitting: Oncology

## 2019-11-22 ENCOUNTER — Other Ambulatory Visit: Payer: Self-pay | Admitting: *Deleted

## 2019-11-22 ENCOUNTER — Inpatient Hospital Stay: Payer: PPO

## 2019-11-22 ENCOUNTER — Other Ambulatory Visit: Payer: Self-pay

## 2019-11-22 ENCOUNTER — Encounter: Payer: Self-pay | Admitting: Oncology

## 2019-11-22 VITALS — BP 125/67 | HR 78 | Temp 98.1°F | Resp 18 | Ht 64.0 in | Wt 126.2 lb

## 2019-11-22 DIAGNOSIS — F17210 Nicotine dependence, cigarettes, uncomplicated: Secondary | ICD-10-CM | POA: Diagnosis not present

## 2019-11-22 DIAGNOSIS — Z170 Estrogen receptor positive status [ER+]: Secondary | ICD-10-CM | POA: Diagnosis not present

## 2019-11-22 DIAGNOSIS — C50412 Malignant neoplasm of upper-outer quadrant of left female breast: Secondary | ICD-10-CM | POA: Insufficient documentation

## 2019-11-22 DIAGNOSIS — M8580 Other specified disorders of bone density and structure, unspecified site: Secondary | ICD-10-CM | POA: Insufficient documentation

## 2019-11-22 DIAGNOSIS — Z7289 Other problems related to lifestyle: Secondary | ICD-10-CM | POA: Diagnosis not present

## 2019-11-22 DIAGNOSIS — Z8249 Family history of ischemic heart disease and other diseases of the circulatory system: Secondary | ICD-10-CM | POA: Insufficient documentation

## 2019-11-22 DIAGNOSIS — Z803 Family history of malignant neoplasm of breast: Secondary | ICD-10-CM | POA: Diagnosis not present

## 2019-11-22 DIAGNOSIS — Z818 Family history of other mental and behavioral disorders: Secondary | ICD-10-CM | POA: Insufficient documentation

## 2019-11-22 DIAGNOSIS — Z79899 Other long term (current) drug therapy: Secondary | ICD-10-CM | POA: Insufficient documentation

## 2019-11-22 DIAGNOSIS — Z885 Allergy status to narcotic agent status: Secondary | ICD-10-CM | POA: Diagnosis not present

## 2019-11-22 DIAGNOSIS — Z801 Family history of malignant neoplasm of trachea, bronchus and lung: Secondary | ICD-10-CM | POA: Insufficient documentation

## 2019-11-22 DIAGNOSIS — J449 Chronic obstructive pulmonary disease, unspecified: Secondary | ICD-10-CM | POA: Insufficient documentation

## 2019-11-22 DIAGNOSIS — Z8049 Family history of malignant neoplasm of other genital organs: Secondary | ICD-10-CM | POA: Diagnosis not present

## 2019-11-22 DIAGNOSIS — Z9012 Acquired absence of left breast and nipple: Secondary | ICD-10-CM | POA: Insufficient documentation

## 2019-11-22 DIAGNOSIS — Z923 Personal history of irradiation: Secondary | ICD-10-CM | POA: Diagnosis not present

## 2019-11-22 DIAGNOSIS — Z822 Family history of deafness and hearing loss: Secondary | ICD-10-CM | POA: Diagnosis not present

## 2019-11-22 LAB — CBC WITH DIFFERENTIAL (CANCER CENTER ONLY)
Abs Immature Granulocytes: 0.01 10*3/uL (ref 0.00–0.07)
Basophils Absolute: 0 10*3/uL (ref 0.0–0.1)
Basophils Relative: 1 %
Eosinophils Absolute: 0.1 10*3/uL (ref 0.0–0.5)
Eosinophils Relative: 2 %
HCT: 44.5 % (ref 36.0–46.0)
Hemoglobin: 15.1 g/dL — ABNORMAL HIGH (ref 12.0–15.0)
Immature Granulocytes: 0 %
Lymphocytes Relative: 30 %
Lymphs Abs: 1.5 10*3/uL (ref 0.7–4.0)
MCH: 33.3 pg (ref 26.0–34.0)
MCHC: 33.9 g/dL (ref 30.0–36.0)
MCV: 98.2 fL (ref 80.0–100.0)
Monocytes Absolute: 0.4 10*3/uL (ref 0.1–1.0)
Monocytes Relative: 8 %
Neutro Abs: 3 10*3/uL (ref 1.7–7.7)
Neutrophils Relative %: 59 %
Platelet Count: 188 10*3/uL (ref 150–400)
RBC: 4.53 MIL/uL (ref 3.87–5.11)
RDW: 13.1 % (ref 11.5–15.5)
WBC Count: 5 10*3/uL (ref 4.0–10.5)
nRBC: 0 % (ref 0.0–0.2)

## 2019-11-22 LAB — CMP (CANCER CENTER ONLY)
ALT: 16 U/L (ref 0–44)
AST: 20 U/L (ref 15–41)
Albumin: 4.1 g/dL (ref 3.5–5.0)
Alkaline Phosphatase: 51 U/L (ref 38–126)
Anion gap: 6 (ref 5–15)
BUN: 20 mg/dL (ref 8–23)
CO2: 33 mmol/L — ABNORMAL HIGH (ref 22–32)
Calcium: 9.6 mg/dL (ref 8.9–10.3)
Chloride: 99 mmol/L (ref 98–111)
Creatinine: 0.77 mg/dL (ref 0.44–1.00)
GFR, Estimated: >60 mL/min (ref >60)
Glucose, Bld: 83 mg/dL (ref 70–99)
Potassium: 4.5 mmol/L (ref 3.5–5.1)
Sodium: 138 mmol/L (ref 135–145)
Total Bilirubin: 0.5 mg/dL (ref 0.3–1.2)
Total Protein: 7.3 g/dL (ref 6.5–8.1)

## 2019-11-23 ENCOUNTER — Other Ambulatory Visit: Payer: Self-pay | Admitting: Oncology

## 2019-11-23 ENCOUNTER — Telehealth: Payer: Self-pay | Admitting: Oncology

## 2019-11-23 NOTE — Telephone Encounter (Signed)
No 11/18 los. No changes made to pt's schedule.   

## 2019-11-23 NOTE — Progress Notes (Signed)
Tanya [name] called yesterday after our meeting to try to correct some wrong information in the "notes" section of "my chart".  I have reviewed my note and that run information is not there.  She tells me the nurse she spoke with yesterday who I think was my nurse said that she would correct this.  I told [name] if this is not corrected to please call our office manager [name] since I really do not know what notes she is actually referring to I do not see any of that information in my note.  She is agreeable to this

## 2019-11-23 NOTE — Progress Notes (Signed)
[place]  [phone] [phone]     ID: JAELA YEPEZ DOB: 04-30-1950  MR#: [id number]  CSN#: [id number]  Patient Care Team: [name], [name], MD as PCP - General (Family Medicine) Markeith Jue, [name], MD as Consulting Physician (Oncology) Howard-McNatt, Mable Fill, MD as Referring Physician (Surgery) [name], MD as Consulting Physician (Orthopedic Surgery) [name], MD as Consulting Physician (Obstetrics and Gynecology) OTHER MD: [name] MD, [name] MD  CHIEF COMPLAINT: Estrogen receptor positive breast cancer (s/p left mastectomy)  CURRENT TREATMENT: Observation   INTERVAL HISTORY:  Tanya [name] returns today for follow-up of her estrogen receptor positive breast cancer.  She continues under observation alone.  Since her last visit, she underwent right screening mammography with tomography at [facility] on 03/21/2019 showing: breast density category C; no evidence of malignancy.  She also underwent lung cancer screening chest CT on 08/29/2019 showing: Lung-RADS 2, benign appearance or behavior.  She has a bone density scheduled for 01/01/2020   REVIEW OF SYSTEMS: Tanya [name] just returned from a family trip where she celebrated her sister 70th birthday in [place].  She unfortunately has not been able to quit smoking yet.  A detailed review of systems was otherwise stable   COVID 19 VACCINATION STATUS: Status post Pfizer x3 with the booster October 2021   BREAST CANCER HISTORY: From the original intake note:  At the age of 69, the patient underwent lumpectomy and sentinel lymph node biopsy for a left sided T1(mic) N0, stage IA invasive breast cancer. She completed whole breast irradiation and received tamoxifen for 5 years. She has continued on raloxifene.  More recently, screening mammography at the breast Center with tomography 06/19/2014 showed calcifications in the left breast, which were further evaluated with  diagnostic left mammography 06/26/2014. Breast density was category C. There was a 5 mm group of heterogeneous calcifications in the outer left breast. This was biopsied 07/04/2014, and showed ([id number]) invasive and in situ ductal carcinoma, grade 2. There was a minimal amount of invasive tumor, but it did allow for prognostic panel, which showed the cancer to be estrogen receptor 90% positive, progesterone receptor 80% positive, both with strong staining intensity, with an MIB-1 of 80%, and HER-2 amplified, with a signals ratio of 8.09, the number per cell being 8.90.  The patient then was evaluated at [facility] by Dr. Elisha Headland and, given the prior history of radiation, mastectomy with repeat sentinel lymph node sampling was suggested. This was performed 08/08/2014, and showed ([id number]) invasive ductal carcinoma, grade 1, measuring 0.3 cm. Margins were negative. All 3 sentinel lymph nodes sampled were negative. Repeat prognostic panel was obtained on the DCIS only, and found the tumor to be estrogen receptor positive at 65% and progesterone receptor positive at 75%.  The patient's subsequent history is as detailed below.    PAST MEDICAL HISTORY: Past Medical History:  Diagnosis Date  . Arthritis    left knee and hip  . Breast cancer ([place])    Radiation and lumpectomy  . COPD, mild ([place])   . Family history of breast cancer   . Family history of uterine cancer   . Hyperlipidemia   . Osteopenia    DEXA 10/27/11: -1.4, no change from 10/2009  . Shingles   . Smoker     PAST SURGICAL HISTORY: Past Surgical History:  Procedure Laterality Date  . BREAST BIOPSY    . BREAST LUMPECTOMY    . COLONOSCOPY    . DILATION  AND EVACUATION    . MASTECTOMY Left AUG March 26, 2014  . TONSILLECTOMY AND ADENOIDECTOMY      FAMILY HISTORY Family History  Problem Relation Age of Onset  . CAD Mother        stent older age  . Hypertension Mother   . Breast cancer Mother 61  . Cancer Mother         SKIN and BLADDER   . Heart disease Paternal Grandfather   . Lung cancer Paternal Grandfather   . Hypertension Father   . Heart disease Father 58       CHF   . Dementia Father   . Hearing loss Maternal Grandfather   . Parkinson's disease Paternal Grandmother   . Colon cancer Neg Hx   . Esophageal cancer Neg Hx   . Stomach cancer Neg Hx   . Rectal cancer Neg Hx   The patient's father died with Alzheimer's disease at age 10. The patient's mother was diagnosed with breast cancer at age 2. She died from C. difficile colitis at age 50. The patient has one brother, one sister. There is no history of breast or ovarian cancer in the immediate family.   GYNECOLOGIC HISTORY:  No LMP recorded. Patient is postmenopausal. Menarche age 69,  the patient is GX P0. She underwent menopause approximately the year 03/26/1998. She did not take hormone replacement. 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 Her husband, Tanya [name], used to be in Press photographer (Set designer) but had 2 back fusions and became disabled.  He died from head and neck cancer October 2020. At  home is just the patient, her dog max having died in 03/26/2019  ADVANCED DIRECTIVES: In place; the patient's friend [name] is a healthcare power of attorney.  She can be reached at [phone] at home or [phone]   HEALTH MAINTENANCE: Social History   Tobacco Use  . Smoking status: Current Every Day Smoker    Packs/day: 1.00    Years: 49.00    Pack years: 49.00    Types: Cigarettes  . Smokeless tobacco: Never Used  . Tobacco comment: Trying to smoke less.   Vaping Use  . Vaping Use: Never used  Substance Use Topics  . Alcohol use: Yes    Alcohol/week: 14.0 standard drinks    Types: 14 Standard drinks or equivalent per week    Comment: 5 DRINKS A WEEK  . Drug use: No     Colonoscopy:  12/16/2017  PAP: 06/30/2017-- normal  Bone density: November 2017: -2.5  Lipid panel: 08/01/2017-- normal  Allergies  Allergen Reactions  . Codeine     REACTION: Rash  . Tetanus Toxoid     REACTION: Shock   Current Outpatient Medications  Medication Sig Dispense Refill  . Calcium Carbonate-Vitamin D 600-400 MG-UNIT tablet Take 1 tablet by mouth in the morning and at bedtime.    . Cholecalciferol (VITAMIN D) 50 MCG (2000 UT) CAPS Take 1 capsule by mouth daily.    Marland Kitchen FLUZONE HIGH-DOSE QUADRIVALENT 0.7 ML SUSY     . ibuprofen (MOTRIN IB) 200 MG tablet Take 1 tablet (200 mg total) by mouth every 6 (six) hours as needed. 30 tablet 0  . Misc Natural Products (OSTEO BI-FLEX TRIPLE STRENGTH) TABS Take 1 tablet by mouth 2 (two) times daily.    . Multiple Vitamin (MULTIVITAMIN) tablet Take 1 tablet by mouth  daily.    . simvastatin (ZOCOR) 10 MG tablet TAKE 1 TABLET BY MOUTH EVERYDAY AT BEDTIME 90 tablet 1   No current facility-administered medications for this visit.    OBJECTIVE: White woman in no acute distress  There were no vitals filed for this visit.   ECOG FS:1 - Symptomatic but completely ambulatory  Sclerae unicteric, EOMs intact Wearing a mask No cervical or supraclavicular adenopathy Lungs no rales or rhonchi Heart regular rate and rhythm Abd soft, nontender, positive bowel sounds MSK no focal spinal tenderness, no upper extremity lymphedema Neuro: nonfocal, well oriented, appropriate affect Breasts: The right breast is benign.  The left breast is status post mastectomy.  There is no evidence of local recurrence.  Both axillae are benign.   LAB RESULTS:  CBC    Component Value Date/Time   WBC 5.0 11/22/2019 0951   WBC 5.3 11/22/2018 0931   RBC 4.53 11/22/2019 0951   HGB 15.1 (H) 11/22/2019 0951   HGB 15.5 11/08/2016 1131   HCT 44.5 11/22/2019 0951   HCT 45.4 11/08/2016 1131   PLT 188 11/22/2019 0951   PLT 176 11/08/2016 1131   MCV 98.2 11/22/2019 0951   MCV 99.1  11/08/2016 1131   MCH 33.3 11/22/2019 0951   MCHC 33.9 11/22/2019 0951   RDW 13.1 11/22/2019 0951   RDW 13.2 11/08/2016 1131   LYMPHSABS 1.5 11/22/2019 0951   LYMPHSABS 1.7 11/08/2016 1131   MONOABS 0.4 11/22/2019 0951   MONOABS 0.5 11/08/2016 1131   EOSABS 0.1 11/22/2019 0951   EOSABS 0.0 11/08/2016 1131   BASOSABS 0.0 11/22/2019 0951   BASOSABS 0.0 11/08/2016 1131   CMP Latest Ref Rng & Units 11/22/2019 11/22/2018 08/03/2018  Glucose 70 - 99 mg/dL 83 86 93  BUN 8 - 23 mg/dL '20 20 18  ' Creatinine 0.44 - 1.00 mg/dL 0.77 0.78 0.72  Sodium 135 - 145 mmol/L 138 139 139  Potassium 3.5 - 5.1 mmol/L 4.5 4.7 4.1  Chloride 98 - 111 mmol/L 99 100 100  CO2 22 - 32 mmol/L 33(H) 29 31  Calcium 8.9 - 10.3 mg/dL 9.6 9.1 9.5  Total Protein 6.5 - 8.1 g/dL 7.3 7.0 7.1  Total Bilirubin 0.3 - 1.2 mg/dL 0.5 0.4 0.6  Alkaline Phos 38 - 126 U/L 51 49 46  AST 15 - 41 U/L '20 20 19  ' ALT 0 - 44 U/L '16 20 16   ' Urinalysis    Component Value Date/Time   COLORURINE YELLOW 04/03/2014 1059   APPEARANCEUR CLEAR 04/03/2014 1059   LABSPEC <=1.005 (A) 04/03/2014 1059   PHURINE 7.0 04/03/2014 1059   GLUCOSEU NEGATIVE 04/03/2014 1059   HGBUR TRACE-INTACT (A) 04/03/2014 1059   BILIRUBINUR NEGATIVE 04/03/2014 1059   KETONESUR NEGATIVE 04/03/2014 1059   PROTEINUR NEG 06/08/2013 1016   UROBILINOGEN 0.2 04/03/2014 1059   NITRITE NEGATIVE 04/03/2014 1059   LEUKOCYTESUR NEGATIVE 04/03/2014 1059    STUDIES: No results found.   ASSESSMENT: 69 y.o. [place] woman  (1) status post left Breast upper outer quadrant lumpectomy and sentinel lymph node sampling 1999 for a T1(mic) N0, stage IA invasive breast cancer,  (a) status post adjuvant radiation  (b) status post tamoxifen for 5 years  (2) status post left breast biopsy 07/04/2014 for a clinical T1a N0, stage IA invasive ductal carcinoma, triple positive, with an MIB-1 of 80%--the patient was on raloxifene at the time  (a) given the 17-year interval between  this and the earlier cancer, this likely was a new primary  (  3) status post left mastectomy and sentinel lymph node sampling 08/06/2014 for a pT1a pN0, stage IA invasive ductal carcinoma, grade 2, with negative margins. There was insufficient tissue to repeat HER-2 testing  (4) adjuvant chemotherapy started 09/16/2014 consisting of paclitaxel weekly 12 with trastuzumab given every 3 weeks, continuing the trastuzumab alone to complete 1 year, but stopped 12/16/2014 for financial reasons  (a) completed 12 doses of paclitaxel 12/02/2014.  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  (7) tobacco abuse: The patient has been strongly advised to quit  (a) Wellbutrin prescribed 11/11/2014, not started by patient   (b) unable to tolerate Chantix  (c) CT chest lungs cancer screening 08/29/2019 negative   PLAN: Tanya [name] is now more than 5 years out from definitive surgery for her breast cancer with no evidence of disease recurrence.  This is very favorable.  She is trying to switch some funds to her retirement account but was not allowed to partly because she continues to smoke and partly because her second breast cancer was listed as a recurrence.  It really is not a recurrence as it occurred 17 years after the first 1.  It is a second primary.  I was glad to clarify that in her notes above and I gave her a copy of that.  Hopefully the economic incentive will further motivate her to quit smoking.  She will benefit from yearly screening low dose chest CTs given her her lung cancer  risk.  At this point I feel comfortable releasing her from breast cancer follow-up.  All she will need as far as that is concerned is her yearly mammography and yearly physician breast exam, which she obtained through Dr. Assunta Curtis office  I will be glad to see Dashauna again at any point in the future if and when the need arises but as of now are making no further routine appointments for her here.  Total encounter time 25 minutes.*  Venora Kautzman, [name], MD  11/23/19 8:21 AM Medical Oncology and Hematology [facility] [place], [place] [address] Tel. [phone]    Fax. [phone]   I, Wilburn Mylar am acting as scribe for Dr. [name]. Jani Moronta.  I, Lurline Del MD, have reviewed the above documentation for accuracy and completeness, and I agree with the above.   *Total Encounter Time as defined by the Centers for Medicare and Medicaid Services includes, in addition to the face-to-face time of a patient visit (documented in the note above) non-face-to-face time: obtaining and reviewing outside history, ordering and reviewing medications, tests or procedures, care coordination (communications with other health care professionals or caregivers) and documentation in the medical record.

## 2020-01-01 ENCOUNTER — Other Ambulatory Visit: Payer: Self-pay

## 2020-01-01 ENCOUNTER — Ambulatory Visit
Admission: RE | Admit: 2020-01-01 | Discharge: 2020-01-01 | Disposition: A | Discharge status: Home / Self Care | Payer: PPO | Source: Ambulatory Visit | Attending: Obstetrics & Gynecology | Admitting: Obstetrics & Gynecology

## 2020-01-01 DIAGNOSIS — M8589 Other specified disorders of bone density and structure, multiple sites: Secondary | ICD-10-CM | POA: Diagnosis not present

## 2020-01-01 DIAGNOSIS — Z780 Asymptomatic menopausal state: Secondary | ICD-10-CM | POA: Diagnosis not present

## 2020-01-07 NOTE — Telephone Encounter (Signed)
Result note "Dr.Lavoie said your bone showed Osteopenia, she recommends Vit D supplements, Ca++ 1500 mg daily and regular weight bearing physical activities. Repeat bone density in 2 years."

## 2020-01-18 ENCOUNTER — Encounter: Payer: Self-pay | Admitting: Family Medicine

## 2020-01-22 ENCOUNTER — Encounter: Payer: Self-pay | Admitting: Family Medicine

## 2020-01-22 ENCOUNTER — Other Ambulatory Visit: Payer: Self-pay | Admitting: Family Medicine

## 2020-01-22 DIAGNOSIS — E785 Hyperlipidemia, unspecified: Secondary | ICD-10-CM

## 2020-01-30 ENCOUNTER — Other Ambulatory Visit (INDEPENDENT_AMBULATORY_CARE_PROVIDER_SITE_OTHER): Payer: PPO

## 2020-01-30 ENCOUNTER — Other Ambulatory Visit: Payer: Self-pay

## 2020-01-30 DIAGNOSIS — E785 Hyperlipidemia, unspecified: Secondary | ICD-10-CM | POA: Diagnosis not present

## 2020-01-30 LAB — LIPID PANEL
Cholesterol: 208 mg/dL — ABNORMAL HIGH (ref 0–200)
HDL: 85.8 mg/dL (ref >39.00)
LDL Cholesterol: 97 mg/dL (ref 0–99)
NonHDL: 122.64
Total CHOL/HDL Ratio: 2
Triglycerides: 126 mg/dL (ref 0.0–149.0)
VLDL: 25.2 mg/dL (ref 0.0–40.0)

## 2020-02-01 ENCOUNTER — Encounter: Payer: Self-pay | Admitting: Family Medicine

## 2020-02-01 MED ORDER — SIMVASTATIN 10 MG PO TABS: ORAL_TABLET | ORAL | 1 refills | Status: DC

## 2020-02-08 ENCOUNTER — Other Ambulatory Visit: Payer: Self-pay | Admitting: Obstetrics & Gynecology

## 2020-02-08 DIAGNOSIS — Z1231 Encounter for screening mammogram for malignant neoplasm of breast: Secondary | ICD-10-CM

## 2020-03-04 DIAGNOSIS — L821 Other seborrheic keratosis: Secondary | ICD-10-CM | POA: Diagnosis not present

## 2020-03-04 DIAGNOSIS — D2261 Melanocytic nevi of right upper limb, including shoulder: Secondary | ICD-10-CM | POA: Diagnosis not present

## 2020-03-04 DIAGNOSIS — L814 Other melanin hyperpigmentation: Secondary | ICD-10-CM | POA: Diagnosis not present

## 2020-03-04 DIAGNOSIS — L578 Other skin changes due to chronic exposure to nonionizing radiation: Secondary | ICD-10-CM | POA: Diagnosis not present

## 2020-03-04 DIAGNOSIS — Z86018 Personal history of other benign neoplasm: Secondary | ICD-10-CM | POA: Diagnosis not present

## 2020-03-04 DIAGNOSIS — D225 Melanocytic nevi of trunk: Secondary | ICD-10-CM | POA: Diagnosis not present

## 2020-03-25 ENCOUNTER — Other Ambulatory Visit: Payer: Self-pay

## 2020-03-25 ENCOUNTER — Encounter: Payer: Self-pay | Admitting: Internal Medicine

## 2020-03-25 ENCOUNTER — Ambulatory Visit (INDEPENDENT_AMBULATORY_CARE_PROVIDER_SITE_OTHER): Payer: PPO | Admitting: Internal Medicine

## 2020-03-25 ENCOUNTER — Ambulatory Visit
Admission: RE | Admit: 2020-03-25 | Discharge: 2020-03-25 | Disposition: A | Discharge status: Home / Self Care | Payer: PPO | Source: Ambulatory Visit | Attending: Obstetrics & Gynecology | Admitting: Obstetrics & Gynecology

## 2020-03-25 VITALS — BP 120/70 | HR 81 | Temp 98.4°F | Wt 129.2 lb

## 2020-03-25 DIAGNOSIS — Z1231 Encounter for screening mammogram for malignant neoplasm of breast: Secondary | ICD-10-CM

## 2020-03-25 DIAGNOSIS — H66002 Acute suppurative otitis media without spontaneous rupture of ear drum, left ear: Secondary | ICD-10-CM

## 2020-03-25 DIAGNOSIS — R590 Localized enlarged lymph nodes: Secondary | ICD-10-CM

## 2020-03-25 MED ORDER — AMOXICILLIN-POT CLAVULANATE 875-125 MG PO TABS: 1.0000 | ORAL_TABLET | Freq: Two times a day (BID) | ORAL | 0 refills | Status: AC

## 2020-03-25 NOTE — Patient Instructions (Signed)
-  Nice seeing you today!!  -Start Augmentin twice daily for 7 days.  -Schedule follow up if not better in 2 weeks.

## 2020-03-25 NOTE — Progress Notes (Signed)
Acute office Visit     This visit occurred during the SARS-CoV-2 public health emergency.  Safety protocols were in place, including screening questions prior to the visit, additional usage of staff PPE, and extensive cleaning of exam room while observing appropriate contact time as indicated for disinfecting solutions.    CC/Reason for Visit: Pain and swelling left ear and left side of neck.  HPI: Tanya [name] is a 70 y.o. female who is coming in today for the above mentioned reasons.  She has COPD and is an ongoing smoker.  In 2016 she had left breast cancer.  4 days ago she noticed left ear pain and swelling under her ear.  She has noted some swelling in that area.  She declines any URI symptoms such as fever, postnasal drip, nasal congestion, sinus congestion.  Past Medical/Surgical History: Past Medical History:  Diagnosis Date  . Arthritis    left knee and hip  . Breast cancer ([employer])    Radiation and lumpectomy  . COPD, mild ([place])   . Family history of breast cancer   . Family history of uterine cancer   . Hyperlipidemia   . Osteopenia    DEXA 10/27/11: -1.4, no change from 10/2009  . Shingles   . Smoker     Past Surgical History:  Procedure Laterality Date  . BREAST BIOPSY    . BREAST LUMPECTOMY    . COLONOSCOPY    . DILATION AND EVACUATION    . MASTECTOMY Left AUG 2016  . TONSILLECTOMY AND ADENOIDECTOMY      Social History:  reports that she has been smoking cigarettes. She has a 49.00 pack-year smoking history. She has never used smokeless tobacco. She reports current alcohol use of about 14.0 standard drinks of alcohol per week. She reports that she does not use drugs.  Allergies: Allergies  Allergen Reactions  . Codeine     REACTION: Rash  . Tetanus Toxoid     REACTION: Shock    Family History:  Family History  Problem Relation Age of Onset  . CAD Mother        stent older age  . Hypertension Mother   . Breast cancer Mother 2  . Cancer  Mother        SKIN and BLADDER   . Heart disease Paternal Grandfather   . Lung cancer Paternal Grandfather   . Hypertension Father   . Heart disease Father 97       CHF   . Dementia Father   . Hearing loss Maternal Grandfather   . Parkinson's disease Paternal Grandmother   . Colon cancer Neg Hx   . Esophageal cancer Neg Hx   . Stomach cancer Neg Hx   . Rectal cancer Neg Hx      Current Outpatient Medications:  .  amoxicillin-clavulanate (AUGMENTIN) 875-125 MG tablet, Take 1 tablet by mouth 2 (two) times daily for 7 days., Disp: 14 tablet, Rfl: 0 .  calcium carbonate (OS-CAL - DOSED IN MG OF ELEMENTAL CALCIUM) 1250 (500 Ca) MG tablet, Take 1 tablet by mouth., Disp: , Rfl:  .  Calcium Carbonate-Vitamin D 600-400 MG-UNIT tablet, Take 1 tablet by mouth in the morning and at bedtime., Disp: , Rfl:  .  Cholecalciferol (VITAMIN D) 50 MCG (2000 UT) CAPS, Take 1 capsule by mouth daily., Disp: , Rfl:  .  ibuprofen (ADVIL) 200 MG tablet, Take 1 tablet (200 mg total) by mouth every 6 (six) hours as needed., Disp:  30 tablet, Rfl: 0 .  Misc Natural Products (OSTEO BI-FLEX TRIPLE STRENGTH) TABS, Take 1 tablet by mouth 2 (two) times daily., Disp: , Rfl:  .  Multiple Vitamin (MULTIVITAMIN) tablet, Take 1 tablet by mouth daily., Disp: , Rfl:  .  simvastatin (ZOCOR) 10 MG tablet, TAKE 1 TABLET BY MOUTH EVERYDAY AT BEDTIME, Disp: 90 tablet, Rfl: 1  Review of Systems:  Constitutional: Denies fever, chills, diaphoresis, appetite change and fatigue.  HEENT: Denies photophobia, eye pain, redness, hearing loss,  congestion, sore throat, rhinorrhea, sneezing, mouth sores, trouble swallowing, neck pain, neck stiffness and tinnitus.   Respiratory: Denies SOB, DOE, cough, chest tightness,  and wheezing.   Cardiovascular: Denies chest pain, palpitations and leg swelling.  Gastrointestinal: Denies nausea, vomiting, abdominal pain, diarrhea, constipation, blood in stool and abdominal distention.  Genitourinary:  Denies dysuria, urgency, frequency, hematuria, flank pain and difficulty urinating.  Endocrine: Denies: hot or cold intolerance, sweats, changes in hair or nails, polyuria, polydipsia. Musculoskeletal: Denies myalgias, back pain, joint swelling, arthralgias and gait problem.  Skin: Denies pallor, rash and wound.  Neurological: Denies dizziness, seizures, syncope, weakness, light-headedness, numbness and headaches.  Hematological: Denies  Easy bruising, personal or family bleeding history  Psychiatric/Behavioral: Denies suicidal ideation, mood changes, confusion, nervousness, sleep disturbance and agitation    Physical Exam: Vitals:   03/25/20 1012  BP: 120/70  Pulse: 81  Temp: 98.4 F (36.9 C)  TempSrc: Oral  SpO2: 98%  Weight: 129 lb 3.2 oz (58.6 kg)    Body mass index is 22.18 kg/m.   Constitutional: NAD, calm, comfortable Eyes: PERRL, lids and conjunctivae normal, wears corrective lenses ENMT: Mucous membranes are moist. Posterior pharynx erythematous without exudate normal dentition.  Right tympanic membrane has some fluid bubbles, left is erythematous with bulging.   Neck: She has some left-sided cervical and submandibular lymphadenopathy. Neurologic: Grossly intact and nonfocal Psychiatric: Normal judgment and insight. Alert and oriented x 3. Normal mood.    Impression and Plan:  Acute suppurative otitis media of left ear without spontaneous rupture of tympanic membrane, recurrence not specified  Left cervical lymphadenopathy   -Start Augmentin 875 mg twice daily for 7 days. -She has been advised to follow-up with PCP in about 2 weeks if she does not note regression of lymph nodes as she does have a history of ongoing tobacco abuse and previous breast cancer.    Patient Instructions  -Nice seeing you today!!  -Start Augmentin twice daily for 7 days.  -Schedule follow up if not better in 2 weeks.     Lelon Frohlich, MD [facility] at  [facility]

## 2020-03-27 ENCOUNTER — Ambulatory Visit: Payer: PPO

## 2020-04-08 ENCOUNTER — Telehealth: Payer: Self-pay | Admitting: Family Medicine

## 2020-04-08 NOTE — Telephone Encounter (Signed)
Patient is calling back and stated that she was seen on 3/22 for ear pain and was given amoxicillin for the pain but has not improved. Per patient she is now feeling fatigue and has post nasal drip with the ear pain and wanted to know if she should be seen again or if something else can be called in, please advise. CB is [phone]

## 2020-04-08 NOTE — Telephone Encounter (Signed)
Set up f/u with pcp - can add on tomorrow at 12:30 or 4:30.

## 2020-04-09 ENCOUNTER — Ambulatory Visit (INDEPENDENT_AMBULATORY_CARE_PROVIDER_SITE_OTHER): Payer: PPO | Admitting: Family Medicine

## 2020-04-09 ENCOUNTER — Other Ambulatory Visit: Payer: Self-pay

## 2020-04-09 ENCOUNTER — Encounter: Payer: Self-pay | Admitting: Family Medicine

## 2020-04-09 VITALS — BP 120/70 | HR 98 | Resp 12 | Ht 64.0 in

## 2020-04-09 DIAGNOSIS — J310 Chronic rhinitis: Secondary | ICD-10-CM | POA: Diagnosis not present

## 2020-04-09 DIAGNOSIS — R5383 Other fatigue: Secondary | ICD-10-CM

## 2020-04-09 DIAGNOSIS — H9203 Otalgia, bilateral: Secondary | ICD-10-CM

## 2020-04-09 LAB — CBC WITH DIFFERENTIAL/PLATELET
Basophils Absolute: 0 10*3/uL (ref 0.0–0.1)
Basophils Relative: 0.9 % (ref 0.0–3.0)
Eosinophils Absolute: 0.1 10*3/uL (ref 0.0–0.7)
Eosinophils Relative: 1.9 % (ref 0.0–5.0)
HCT: 43.4 % (ref 36.0–46.0)
Hemoglobin: 14.8 g/dL (ref 12.0–15.0)
Lymphocytes Relative: 28.9 % (ref 12.0–46.0)
Lymphs Abs: 1.5 10*3/uL (ref 0.7–4.0)
MCHC: 34.1 g/dL (ref 30.0–36.0)
MCV: 99.3 fl (ref 78.0–100.0)
Monocytes Absolute: 0.4 10*3/uL (ref 0.1–1.0)
Monocytes Relative: 7.4 % (ref 3.0–12.0)
Neutro Abs: 3.2 10*3/uL (ref 1.4–7.7)
Neutrophils Relative %: 60.9 % (ref 43.0–77.0)
Platelets: 193 10*3/uL (ref 150.0–400.0)
RBC: 4.37 Mil/uL (ref 3.87–5.11)
RDW: 13 % (ref 11.5–15.5)
WBC: 5.3 10*3/uL (ref 4.0–10.5)

## 2020-04-09 LAB — BASIC METABOLIC PANEL
BUN: 20 mg/dL (ref 6–23)
CO2: 36 mEq/L — ABNORMAL HIGH (ref 19–32)
Calcium: 9.8 mg/dL (ref 8.4–10.5)
Chloride: 98 mEq/L (ref 96–112)
Creatinine, Ser: 0.78 mg/dL (ref 0.40–1.20)
GFR: 78.2 mL/min (ref >60.00)
Glucose, Bld: 87 mg/dL (ref 70–99)
Potassium: 4.5 mEq/L (ref 3.5–5.1)
Sodium: 139 mEq/L (ref 135–145)

## 2020-04-09 LAB — TSH: TSH: 1.36 u[IU]/mL (ref 0.35–4.50)

## 2020-04-09 MED ORDER — FLUTICASONE PROPIONATE 50 MCG/ACT NA SUSP: 2.0000 | Freq: Every day | NASAL | 3 refills | Status: DC | PRN

## 2020-04-09 NOTE — Patient Instructions (Signed)
A few things to remember from today's visit:   Rhinitis, unspecified type - Plan: fluticasone (FLONASE) 50 MCG/ACT nasal spray  Fatigue, unspecified type - Plan: CBC with Differential/Platelet, Basic metabolic panel, TSH  If you need refills please call your pharmacy. Do not use My Chart to request refills or for acute issues that need immediate attention.   Monitor for new symptoms. Ears are clear. Nasal saline irrigations as needed. Zyrtec 10 mg daily. Flonase nasal spray added today.  Please be sure medication list is accurate. If a new problem present, please set up appointment sooner than planned today.

## 2020-04-09 NOTE — Progress Notes (Signed)
Chief Complaint  Patient presents with  . Ear Pain    On antibiotic and not improving.     HPI: Tanya [name] is a 70 y.o. female, who is here today to follow on recent OV. She was evaluated on 03/25/20, dx'ed with OM and started on Augmentin. Earache,L>R,it has improved but has not resolved. Also concerned about swollen neck glands, bilateral.  Denies changes in hearing or tinnitus.  A few days ago she started with sore throat,worse in the morning. Fatigue, which is also a new problem. Sleeping about 8-10 hours. She has needed to take naps.  Post nasal drainage,rhinorrhea,and nasal congestion. No history of allergies. Negative for sick contact or recent travel. She has not noted anosmia or ageusia.  No eye discharge or pruritus but feels "glazy eyes." Hx of dry eye synd.  Cough, which has been intermittently for a while and attributed to tobacco use. Recently she had some SOB while she was mowing ehr grass. No associated wheezing,diaphoresis, or CP.  Lab Results  Component Value Date   TSH 1.54 04/03/2014   Hx of left breast cancer, she followed with hematologist until 11/2019. According to pt, she can continue following prn.  Lab Results  Component Value Date   WBC 5.0 11/22/2019   HGB 15.1 (H) 11/22/2019   HCT 44.5 11/22/2019   MCV 98.2 11/22/2019   PLT 188 11/22/2019   Review of Systems  Constitutional: Positive for activity change. Negative for appetite change.  HENT: Negative for mouth sores and nosebleeds.   Eyes: Negative for redness and visual disturbance.  Cardiovascular: Negative for palpitations and leg swelling.  Gastrointestinal: Negative for abdominal pain, nausea and vomiting.       Negative for changes in bowel habits.  Genitourinary: Negative for decreased urine volume, dysuria and hematuria.  Allergic/Immunologic: Negative for environmental allergies.  Neurological: Negative for syncope, weakness and headaches.  Rest see pertinent  positives and negatives per HPI.  Current Outpatient Medications on File Prior to Visit  Medication Sig Dispense Refill  . calcium carbonate (OS-CAL - DOSED IN MG OF ELEMENTAL CALCIUM) 1250 (500 Ca) MG tablet Take 1 tablet by mouth.    . Calcium Carbonate-Vitamin D 600-400 MG-UNIT tablet Take 1 tablet by mouth in the morning and at bedtime.    . Cholecalciferol (VITAMIN D) 50 MCG (2000 UT) CAPS Take 1 capsule by mouth daily.    Marland Kitchen ibuprofen (ADVIL) 200 MG tablet Take 1 tablet (200 mg total) by mouth every 6 (six) hours as needed. 30 tablet 0  . Misc Natural Products (OSTEO BI-FLEX TRIPLE STRENGTH) TABS Take 1 tablet by mouth 2 (two) times daily.    . Multiple Vitamin (MULTIVITAMIN) tablet Take 1 tablet by mouth daily.    . simvastatin (ZOCOR) 10 MG tablet TAKE 1 TABLET BY MOUTH EVERYDAY AT BEDTIME 90 tablet 1   No current facility-administered medications on file prior to visit.   Past Medical History:  Diagnosis Date  . Arthritis    left knee and hip  . Breast cancer ([place])    Radiation and lumpectomy  . COPD, mild ([place])   . Family history of breast cancer   . Family history of uterine cancer   . Hyperlipidemia   . Osteopenia    DEXA 10/27/11: -1.4, no change from 10/2009  . Shingles   . Smoker    Allergies  Allergen Reactions  . Codeine     REACTION: Rash  . Tetanus Toxoid     REACTION:  Shock    Social History   Socioeconomic History  . Marital status: Widowed    Spouse name: Not on file  . Number of children: 0  . Years of education: Not on file  . Highest education level: Not on file  Occupational History  . Not on file  Tobacco Use  . Smoking status: Current Every Day Smoker    Packs/day: 1.00    Years: 49.00    Pack years: 49.00    Types: Cigarettes  . Smokeless tobacco: Never Used  . Tobacco comment: Trying to smoke less.   Vaping Use  . Vaping Use: Never used  Substance and Sexual Activity  . Alcohol use: Yes    Alcohol/week: 14.0 standard drinks     Types: 14 Standard drinks or equivalent per week    Comment: 5 DRINKS A WEEK  . Drug use: No  . Sexual activity: Not Currently  Other Topics Concern  . Not on file  Social History Narrative   Lives at home with husband who is disabled.     Social Determinants of Health   Financial Resource Strain: Not on file  Food Insecurity: Not on file  Transportation Needs: Not on file  Physical Activity: Not on file  Stress: Not on file  Social Connections: Not on file   Vitals:   04/09/20 1221  BP: 120/70  Pulse: 98  Resp: 12  SpO2: 99%   Body mass index is 22.18 kg/m.  Physical Exam Vitals and nursing note reviewed.  Constitutional:      General: She is not in acute distress.    Appearance: She is well-developed and normal weight. She is not ill-appearing.  HENT:     Head: Normocephalic and atraumatic.     Right Ear: Tympanic membrane, ear canal and external ear normal.     Left Ear: Tympanic membrane, ear canal and external ear normal.     Ears:     Comments: No cerumen in ear canal,bilateral. No tenderness when pressing tragus or upon otoscopic exam.    Nose: Congestion (Hyperemic nasal mucosa) and rhinorrhea present. No mucosal edema.     Right Turbinates: Not enlarged.     Left Turbinates: Not enlarged.     Mouth/Throat:     Mouth: Mucous membranes are moist.     Pharynx: Oropharynx is clear.  Eyes:     Conjunctiva/sclera: Conjunctivae normal.  Cardiovascular:     Rate and Rhythm: Normal rate and regular rhythm.     Heart sounds: No murmur heard.   Pulmonary:     Effort: Pulmonary effort is normal. No respiratory distress.     Breath sounds: Normal breath sounds. No stridor.  Musculoskeletal:     Cervical back: No edema or erythema. No muscular tenderness.  Lymphadenopathy:     Head:     Right side of head: No submandibular adenopathy.     Left side of head: No submandibular adenopathy.     Cervical: No cervical adenopathy.     Comments: I can palpate some  cervical lymph nodes but symmetric and not enlarged or tender.  Skin:    General: Skin is warm.     Findings: No erythema or rash.  Neurological:     General: No focal deficit present.     Mental Status: She is alert and oriented to person, place, and time.     Gait: Gait normal.  Psychiatric:        Mood and Affect: Mood is anxious.  Comments: Well groomed, good eye contact.   ASSESSMENT AND PLAN:   Ms.Jorene was seen today for ear pain.  Diagnoses and all orders for this visit: Orders Placed This Encounter  Procedures  . CBC with Differential/Platelet  . Basic metabolic panel  . TSH   Lab Results  Component Value Date   WBC 5.3 04/09/2020   HGB 14.8 04/09/2020   HCT 43.4 04/09/2020   MCV 99.3 04/09/2020   PLT 193.0 04/09/2020   Lab Results  Component Value Date   TSH 1.36 04/09/2020   Lab Results  Component Value Date   CREATININE 0.78 04/09/2020   BUN 20 04/09/2020   NA 139 04/09/2020   K 4.5 04/09/2020   CL 98 04/09/2020   CO2 36 (H) 04/09/2020   Fatigue, unspecified type Reported as a new problem. We discussed possible etiologies. History and examination today do not suggest a serious process. ?  Acute respiratory illness, allergies. Instructed to monitor for new symptoms. Further recommendation will be given according to lab results.  Rhinitis, unspecified type Recommend symptomatic treatment with intranasal steroid, daily as needed. Nasal saline irrigations as needed. OTC Zyrtec 10 mg daily.  -     fluticasone (FLONASE) 50 MCG/ACT nasal spray; Place 2 sprays into both nostrils daily as needed for allergies or rhinitis.  Earache symptoms, bilateral Improved with antibiotic treatment. Ear exam otherwise negative. ?  Eustachian dysfunction We could consider ENT evaluation if problem is persistent in a couple weeks,before if it gets worse. Smoking cessation encouraged.  Return if symptoms worsen or fail to improve.   Viaan Knippenberg G. [name],  MD  [facility]. [place] office.   A few things to remember from today's visit:   Rhinitis, unspecified type - Plan: fluticasone (FLONASE) 50 MCG/ACT nasal spray  Fatigue, unspecified type - Plan: CBC with Differential/Platelet, Basic metabolic panel, TSH  If you need refills please call your pharmacy. Do not use My Chart to request refills or for acute issues that need immediate attention.   Monitor for new symptoms. Ears are clear. Nasal saline irrigations as needed. Zyrtec 10 mg daily. Flonase nasal spray added today.  Please be sure medication list is accurate. If a new problem present, please set up appointment sooner than planned today.

## 2020-04-10 ENCOUNTER — Encounter: Payer: Self-pay | Admitting: Family Medicine

## 2020-07-16 ENCOUNTER — Other Ambulatory Visit: Payer: Self-pay

## 2020-07-16 ENCOUNTER — Encounter: Payer: Self-pay | Admitting: Family Medicine

## 2020-07-16 ENCOUNTER — Ambulatory Visit (INDEPENDENT_AMBULATORY_CARE_PROVIDER_SITE_OTHER): Payer: PPO | Admitting: Family Medicine

## 2020-07-16 VITALS — BP 124/70 | HR 83 | Resp 16 | Ht 64.0 in | Wt 127.4 lb

## 2020-07-16 DIAGNOSIS — Z170 Estrogen receptor positive status [ER+]: Secondary | ICD-10-CM

## 2020-07-16 DIAGNOSIS — E785 Hyperlipidemia, unspecified: Secondary | ICD-10-CM

## 2020-07-16 DIAGNOSIS — Z0000 Encounter for general adult medical examination without abnormal findings: Secondary | ICD-10-CM | POA: Diagnosis not present

## 2020-07-16 DIAGNOSIS — I700 Atherosclerosis of aorta: Secondary | ICD-10-CM

## 2020-07-16 DIAGNOSIS — M8589 Other specified disorders of bone density and structure, multiple sites: Secondary | ICD-10-CM | POA: Diagnosis not present

## 2020-07-16 DIAGNOSIS — J449 Chronic obstructive pulmonary disease, unspecified: Secondary | ICD-10-CM | POA: Diagnosis not present

## 2020-07-16 DIAGNOSIS — C50412 Malignant neoplasm of upper-outer quadrant of left female breast: Secondary | ICD-10-CM | POA: Diagnosis not present

## 2020-07-16 LAB — COMPREHENSIVE METABOLIC PANEL
ALT: 15 U/L (ref 0–35)
AST: 20 U/L (ref 0–37)
Albumin: 4.6 g/dL (ref 3.5–5.2)
Alkaline Phosphatase: 47 U/L (ref 39–117)
BUN: 15 mg/dL (ref 6–23)
CO2: 32 mEq/L (ref 19–32)
Calcium: 9.5 mg/dL (ref 8.4–10.5)
Chloride: 99 mEq/L (ref 96–112)
Creatinine, Ser: 0.72 mg/dL (ref 0.40–1.20)
GFR: 85.92 mL/min (ref >60.00)
Glucose, Bld: 90 mg/dL (ref 70–99)
Potassium: 4.5 mEq/L (ref 3.5–5.1)
Sodium: 138 mEq/L (ref 135–145)
Total Bilirubin: 0.5 mg/dL (ref 0.2–1.2)
Total Protein: 7 g/dL (ref 6.0–8.3)

## 2020-07-16 LAB — LIPID PANEL
Cholesterol: 194 mg/dL (ref 0–200)
HDL: 85.5 mg/dL (ref >39.00)
LDL Cholesterol: 91 mg/dL (ref 0–99)
NonHDL: 108.15
Total CHOL/HDL Ratio: 2
Triglycerides: 88 mg/dL (ref 0.0–149.0)
VLDL: 17.6 mg/dL (ref 0.0–40.0)

## 2020-07-16 NOTE — Patient Instructions (Addendum)
Today you have you routine preventive visit. A few things to remember from today's visit:   Routine general medical examination at a health care facility  Chronic obstructive pulmonary disease, unspecified COPD type ([place]), Chronic  Aortic atherosclerosis ([place]), Chronic  Malignant neoplasm of upper-outer quadrant of left breast in female, estrogen receptor positive ([place]), Chronic  Hyperlipidemia, unspecified hyperlipidemia type - Plan: Comprehensive metabolic panel, Lipid panel  Osteopenia of multiple sites  If you need refills please call your pharmacy. Do not use My Chart to request refills or for acute issues that need immediate attention.   Aspirin 81 mg daily for cardiovascular prevention. Continue simvastatin. Please be sure medication list is accurate. If a new problem present, please set up appointment sooner than planned today.  At least 150 minutes of moderate exercise per week, daily brisk walking for 15-30 min is a good exercise option. Healthy diet low in saturated (animal) fats and sweets and consisting of fresh fruits and vegetables, lean meats such as fish and white chicken and whole grains.  These are some of recommendations for screening depending of age and risk factors:  - Vaccines:  Tdap vaccine every 10 years.  Shingles vaccine recommended at age 84, could be given after 68 years of age but not sure about insurance coverage.   Pneumonia vaccines: Pneumovax at 59. Sometimes Pneumovax is giving earlier if history of smoking, lung disease,diabetes,kidney disease among some.  Screening for diabetes at age 35 and every 3 years.  Cervical cancer prevention:  Pap smear starts at 68 years of age and continues periodically until 68 years old in low risk women. Pap smear every 3 years between 59 and 65 years old. Pap smear every 3-5 years between women 33 and older if pap smear negative and HPV screening negative.   -Breast cancer: Mammogram: There is  disagreement between experts about when to start screening in low risk asymptomatic female but recent recommendations are to start screening at 18 and not later than 68 years old , every 1-2 years and after 68 yo q 2 years. Screening is recommended until 68 years old but some women can continue screening depending of healthy issues.  Colon cancer screening: Has been recently changed to 67 yo. Insurance may not cover until you are 68 years old. Screening is recommended until 68 years old.  Cholesterol disorder screening at age 34 and every 3 years.  Also recommended:  Dental visit- Brush and floss your teeth twice daily; visit your dentist twice a year. Eye doctor- Get an eye exam at least every 2 years. Helmet use- Always wear a helmet when riding a bicycle, motorcycle, rollerblading or skateboarding. Safe sex- If you may be exposed to sexually transmitted infections, use a condom. Seat belts- Seat belts can save your live; always wear one. Smoke/Carbon Monoxide detectors- These detectors need to be installed on the appropriate level of your home. Replace batteries at least once a year. Skin cancer- When out in the sun please cover up and use sunscreen 15 SPF or higher. Violence- If anyone is threatening or hurting you, please tell your healthcare provider.  Drink alcohol in moderation- Limit alcohol intake to one drink or less per day. Never drink and drive. Calcium supplementation 1000 to 1200 mg daily, ideally through your diet.  Vitamin D supplementation 800 units daily.

## 2020-07-16 NOTE — Progress Notes (Signed)
HPI: Tanya [name] is a 70 y.o. female, who is here today for her routine physical.  Last CPE: 07/16/19. Retired since age 11. Regular exercise 3 or more time per week: She has not been consistent but she is active with daily chores and yard work. Following a healthy diet: She is trying to cook more, she loves sweets. She lives alone.  Chronic medical problems: COPD, aortic atherosclerosis, chemotherapy-induced neuropathy,left-sided breast cancer s/p lumpectomy, hyperlipidemia, OA,and osteoporosis among some.  Immunization History  Administered Date(s) Administered   Influenza Split 10/05/2011   Influenza Whole 09/04/2008   Influenza, High Dose Seasonal PF 10/18/2017, 10/09/2018, 10/09/2019   Influenza,inj,Quad PF,6+ Mos 10/10/2012, 09/18/2013, 12/16/2014, 10/13/2015, 09/15/2016   PFIZER(Purple Top)SARS-COV-2 Vaccination 02/09/2019, 03/07/2019, 10/15/2019   Pneumococcal Conjugate-13 05/02/2017   Pneumococcal Polysaccharide-23 11/04/2008, 08/03/2018   Zoster Recombinat (Shingrix) 05/10/2017, 08/03/2017   Zoster, Live 03/17/2012   Last pap smear on 08/21/19. She follows with gyn regularly, Dr Dellis Filbert , next appt in 08/2020.  Mammogram: 03/27/20 Bi-Rads 1 Lun cancer screening: 08/29/19. Colonoscopy: 12/16/17. DEXA: 01/01/20 Major Osteoporotic Fracture: 28.7% Hip Fracture: 8.2%  She took Fosamax from 12/2015 to 11/2016, stopped because it caused aches. She also took Evista 60 mg daily. She takes vit D 2000 U daily and Ca++ 600 mg bid.  Hep C screening: 06/08/13 NR  HLD: She is on Simvastatin 10 mg daily. Aortic atherosclerosis has been seen on chest CT.  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 She has not had cough,wheezing, or DOE. + Smoker, she is not interested in smoking cessation for  now.  Review of Systems  Constitutional:  Positive for fatigue. Negative for appetite change and fever.  HENT:  Negative for hearing loss, mouth sores and sore throat.   Eyes:  Negative for redness and visual disturbance.  Respiratory:  Negative for cough, shortness of breath and wheezing.   Cardiovascular:  Negative for chest pain, palpitations and leg swelling.  Gastrointestinal:  Negative for abdominal pain, nausea and vomiting.       No changes in bowel habits.  Endocrine: Negative for cold intolerance, heat intolerance, polydipsia, polyphagia and polyuria.  Genitourinary:  Negative for decreased urine volume, dysuria, hematuria, vaginal bleeding and vaginal discharge.  Musculoskeletal:  Positive for arthralgias. Negative for gait problem and myalgias.  Skin:  Negative for color change and rash.  Allergic/Immunologic: Positive for environmental allergies.  Neurological:  Negative for syncope, weakness and headaches.  Psychiatric/Behavioral:  Negative for behavioral problems and confusion.   All other systems reviewed and are negative.  Current Outpatient Medications on File Prior to Visit  Medication Sig Dispense Refill   calcium carbonate (OS-CAL - DOSED IN MG OF ELEMENTAL CALCIUM) 1250 (500 Ca) MG tablet Take 1 tablet by mouth.     Calcium Carbonate-Vitamin D 600-400 MG-UNIT tablet Take 1 tablet by mouth in the morning and at bedtime.     Cholecalciferol (VITAMIN D) 50 MCG (2000 UT) CAPS Take 1 capsule by mouth daily.     fluticasone (FLONASE) 50 MCG/ACT nasal spray Place 2 sprays into both nostrils daily as needed for allergies or rhinitis. 16 g 3   ibuprofen (ADVIL) 200 MG tablet Take 1 tablet (200 mg total) by mouth every 6 (six) hours as needed. 30 tablet 0  Misc Natural Products (OSTEO BI-FLEX TRIPLE STRENGTH) TABS Take 1 tablet by mouth 2 (two) times daily.     Multiple Vitamin (MULTIVITAMIN) tablet Take 1 tablet by mouth daily.     simvastatin (ZOCOR) 10 MG tablet TAKE 1  TABLET BY MOUTH EVERYDAY AT BEDTIME 90 tablet 1   No current facility-administered medications on file prior to visit.     Past Medical History:  Diagnosis Date   Arthritis    left knee and hip   Breast cancer ([place])    Radiation and lumpectomy   COPD, mild ([place])    Family history of breast cancer    Family history of uterine cancer    Hyperlipidemia    Osteopenia    DEXA 10/27/11: -1.4, no change from 10/2009   Shingles    Smoker     Past Surgical History:  Procedure Laterality Date   BREAST BIOPSY     BREAST LUMPECTOMY     COLONOSCOPY     DILATION AND EVACUATION     MASTECTOMY Left AUG 2016   TONSILLECTOMY AND ADENOIDECTOMY      Allergies  Allergen Reactions   Codeine     REACTION: Rash   Tetanus Toxoid     REACTION: Shock    Family History  Problem Relation Age of Onset   CAD Mother        stent older age   Hypertension Mother    Breast cancer Mother 79   Cancer Mother        SKIN and BLADDER    Heart disease Paternal Grandfather    Lung cancer Paternal Grandfather    Hypertension Father    Heart disease Father 5       CHF    Dementia Father    Hearing loss Maternal Grandfather    Parkinson's disease Paternal Grandmother    Colon cancer Neg Hx    Esophageal cancer Neg Hx    Stomach cancer Neg Hx    Rectal cancer Neg Hx     Social History   Socioeconomic History   Marital status: Widowed    Spouse name: Not on file   Number of children: 0   Years of education: Not on file   Highest education level: Not on file  Occupational History   Not on file  Tobacco Use   Smoking status: Every Day    Packs/day: 1.00    Years: 49.00    Pack years: 49.00    Types: Cigarettes   Smokeless tobacco: Never   Tobacco comments:    Trying to smoke less.   Vaping Use   Vaping Use: Never used  Substance and Sexual Activity   Alcohol use: Yes    Alcohol/week: 14.0 standard drinks    Types: 14 Standard drinks or equivalent per week    Comment: 5 DRINKS A  WEEK   Drug use: No   Sexual activity: Not Currently  Other Topics Concern   Not on file  Social History Narrative   Lives at home with husband who is disabled.     Social Determinants of Health   Financial Resource Strain: Not on file  Food Insecurity: Not on file  Transportation Needs: Not on file  Physical Activity: Not on file  Stress: Not on file  Social Connections: Not on file     Vitals:   07/16/20 0951  BP: 124/70  Pulse: 83  Resp: 16  SpO2: 97%   Body mass index is 21.86 kg/m.  Wt  Readings from Last 3 Encounters:  07/16/20 127 lb 6 oz (57.8 kg)  03/25/20 129 lb 3.2 oz (58.6 kg)  11/22/19 126 lb 3.2 oz (57.2 kg)   Physical Exam Vitals and nursing note reviewed.  Constitutional:      General: She is not in acute distress.    Appearance: She is well-developed and normal weight.  HENT:     Head: Normocephalic and atraumatic.     Right Ear: Hearing, tympanic membrane, ear canal and external ear normal.     Left Ear: Hearing, tympanic membrane, ear canal and external ear normal.     Mouth/Throat:     Mouth: Mucous membranes are moist.     Pharynx: Oropharynx is clear. Uvula midline.  Eyes:     Extraocular Movements: Extraocular movements intact.     Conjunctiva/sclera: Conjunctivae normal.     Pupils: Pupils are equal, round, and reactive to light.  Neck:     Thyroid: No thyroid mass.  Cardiovascular:     Rate and Rhythm: Normal rate and regular rhythm.     Pulses:          Dorsalis pedis pulses are 2+ on the right side and 2+ on the left side.     Heart sounds: No murmur heard. Pulmonary:     Effort: Pulmonary effort is normal. No respiratory distress.     Breath sounds: Normal breath sounds.  Chest:  Breasts:    Right: No supraclavicular adenopathy.     Left: No supraclavicular adenopathy.  Abdominal:     Palpations: Abdomen is soft. There is no hepatomegaly or mass.     Tenderness: There is no abdominal tenderness.  Genitourinary:     Comments: Deferred to gyn. Musculoskeletal:     Comments: No signs of synovitis appreciated.  Lymphadenopathy:     Cervical: No cervical adenopathy.     Upper Body:     Right upper body: No supraclavicular adenopathy.     Left upper body: No supraclavicular adenopathy.  Skin:    General: Skin is warm.     Findings: No erythema or rash.  Neurological:     General: No focal deficit present.     Mental Status: She is alert and oriented to person, place, and time.     Cranial Nerves: No cranial nerve deficit.     Coordination: Coordination normal.     Gait: Gait normal.     Deep Tendon Reflexes:     Reflex Scores:      Bicep reflexes are 2+ on the right side and 2+ on the left side.      Patellar reflexes are 2+ on the right side and 2+ on the left side. Psychiatric:        Speech: Speech normal.     Comments: Well groomed, good eye contact.   ASSESSMENT AND PLAN:  Ms. JAKAILA NORMENT was here today annual physical examination.  Orders Placed This Encounter  Procedures   Comprehensive metabolic panel   Lipid panel   Lab Results  Component Value Date   CREATININE 0.72 07/16/2020   BUN 15 07/16/2020   NA 138 07/16/2020   K 4.5 07/16/2020   CL 99 07/16/2020   CO2 32 07/16/2020   Lab Results  Component Value Date   ALT 15 07/16/2020   AST 20 07/16/2020   ALKPHOS 47 07/16/2020   BILITOT 0.5 07/16/2020   Lab Results  Component Value Date   CHOL 194 07/16/2020   HDL 85.50 07/16/2020  [place] 91 07/16/2020   LDLDIRECT 127.4 03/13/2012   TRIG 88.0 07/16/2020   CHOLHDL 2 07/16/2020   Routine general medical examination at a health care facility We discussed the importance of regular physical activity and healthy diet for prevention of chronic illness and/or complications. Preventive guidelines reviewed. Vaccination up to date. Continue female preventive care with her gyn. Ca++ and vit D supplementation to continue Next CPE in a year.  The 10-year ASCVD risk score  Mikey Bussing DC Brooke Bonito., et al., 2013) is: 10.7%   Values used to calculate the score:     Age: 40 years     Sex: Female     Is Non-Hispanic African American: No     Diabetic: No     Tobacco smoker: Yes     Systolic Blood Pressure: 062 mmHg     Is BP treated: No     HDL Cholesterol: 85.5 mg/dL     Total Cholesterol: 194 mg/dL  Chronic obstructive pulmonary disease, unspecified COPD type ([facility]) Asymptomatic. She understand adverse effects of tobacco use.  Aortic atherosclerosis ([facility]) Continue Simvastatin 10 mg daily. Aspirin 81 mg daily recommended, side effects discussed.  Hyperlipidemia, unspecified hyperlipidemia type Continue Simvastatin 10 mg daily and low fat diet.  Osteopenia of multiple sites She is not interested in pharmacologic treatment for osteoporosis. Fall precautions. Continue Vit D and Ca++ supplementation.  Malignant neoplasm of upper-outer quadrant of left breast in female, estrogen receptor positive ([place]) Completed 5 years of Tamoxifen. She is not longer following with oncologist.   Return in 1 year (on 07/16/2021) for Need AWV, can be done virtual..   Kampbell Holaway G. [name], MD  [facility]. [place] office.

## 2020-07-20 ENCOUNTER — Encounter: Payer: Self-pay | Admitting: Family Medicine

## 2020-07-20 MED ORDER — SIMVASTATIN 10 MG PO TABS
ORAL_TABLET | ORAL | 3 refills | Status: DC
Start: 2020-07-20 — End: 2021-07-22

## 2020-07-22 ENCOUNTER — Telehealth: Payer: Self-pay | Admitting: Family Medicine

## 2020-07-22 DIAGNOSIS — H04123 Dry eye syndrome of bilateral lacrimal glands: Secondary | ICD-10-CM | POA: Diagnosis not present

## 2020-07-22 DIAGNOSIS — D492 Neoplasm of unspecified behavior of bone, soft tissue, and skin: Secondary | ICD-10-CM | POA: Diagnosis not present

## 2020-07-22 DIAGNOSIS — H2513 Age-related nuclear cataract, bilateral: Secondary | ICD-10-CM | POA: Diagnosis not present

## 2020-07-22 DIAGNOSIS — H0102B Squamous blepharitis left eye, upper and lower eyelids: Secondary | ICD-10-CM | POA: Diagnosis not present

## 2020-07-22 DIAGNOSIS — H0102A Squamous blepharitis right eye, upper and lower eyelids: Secondary | ICD-10-CM | POA: Diagnosis not present

## 2020-07-22 DIAGNOSIS — H5203 Hypermetropia, bilateral: Secondary | ICD-10-CM | POA: Diagnosis not present

## 2020-07-22 DIAGNOSIS — H1045 Other chronic allergic conjunctivitis: Secondary | ICD-10-CM | POA: Diagnosis not present

## 2020-07-22 NOTE — Telephone Encounter (Signed)
Documented on spreadsheet 

## 2020-07-22 NOTE — Telephone Encounter (Signed)
Pt called the office back and has been scheduled

## 2020-07-22 NOTE — Telephone Encounter (Signed)
Left message for patient to call back and schedule Medicare Annual Wellness Visit (AWV) either virtually or in office.   Last AWV 07/16/19  please schedule at anytime with [facility] Nurse Health Advisor 1 or 2   This should be a 45 minute visit.

## 2020-08-06 DIAGNOSIS — M1612 Unilateral primary osteoarthritis, left hip: Secondary | ICD-10-CM | POA: Diagnosis not present

## 2020-08-12 ENCOUNTER — Ambulatory Visit (INDEPENDENT_AMBULATORY_CARE_PROVIDER_SITE_OTHER): Payer: PPO

## 2020-08-12 DIAGNOSIS — Z0000 Encounter for general adult medical examination without abnormal findings: Secondary | ICD-10-CM

## 2020-08-12 NOTE — Patient Instructions (Signed)
Tanya [name] , Thank you for taking time to come for your Medicare Wellness Visit. I appreciate your ongoing commitment to your health goals. Please review the following plan we discussed and let me know if I can assist you in the future.   Screening recommendations/referrals: Colonoscopy: 12/16/2017 due 2029 Mammogram: 03/25/2020 Bone Density: 01/01/2020 Recommended yearly ophthalmology/optometry visit for glaucoma screening and checkup Recommended yearly dental visit for hygiene and checkup  Vaccinations: Influenza vaccine: due in fall 2022  Pneumococcal vaccine: completed series  Tdap vaccine: allergy to TDAP  Shingles vaccine: completed series     Advanced directives: will provide copies   Conditions/risks identified: none   Next appointment: none    Preventive Care 40 Years and Older, Female Preventive care refers to lifestyle choices and visits with your health care provider that can promote health and wellness. What does preventive care include? A yearly physical exam. This is also called an annual well check. Dental exams once or twice a year. Routine eye exams. Ask your health care provider how often you should have your eyes checked. Personal lifestyle choices, including: Daily care of your teeth and gums. Regular physical activity. Eating a healthy diet. Avoiding tobacco and drug use. Limiting alcohol use. Practicing safe sex. Taking low-dose aspirin every day. Taking vitamin and mineral supplements as recommended by your health care provider. What happens during an annual well check? The services and screenings done by your health care provider during your annual well check will depend on your age, overall health, lifestyle risk factors, and family history of disease. Counseling  Your health care provider may ask you questions about your: Alcohol use. Tobacco use. Drug use. Emotional well-being. Home and relationship well-being. Sexual activity. Eating  habits. History of falls. Memory and ability to understand (cognition). Work and work Statistician. Reproductive health. Screening  You may have the following tests or measurements: Height, weight, and BMI. Blood pressure. Lipid and cholesterol levels. These may be checked every 5 years, or more frequently if you are over 45 years old. Skin check. Lung cancer screening. You may have this screening every year starting at age 30 if you have a 30-pack-year history of smoking and currently smoke or have quit within the past 15 years. Fecal occult blood test (FOBT) of the stool. You may have this test every year starting at age 80. Flexible sigmoidoscopy or colonoscopy. You may have a sigmoidoscopy every 5 years or a colonoscopy every 10 years starting at age 45. Hepatitis C blood test. Hepatitis B blood test. Sexually transmitted disease (STD) testing. Diabetes screening. This is done by checking your blood sugar (glucose) after you have not eaten for a while (fasting). You may have this done every 1-3 years. Bone density scan. This is done to screen for osteoporosis. You may have this done starting at age 62. Mammogram. This may be done every 1-2 years. Talk to your health care provider about how often you should have regular mammograms. Talk with your health care provider about your test results, treatment options, and if necessary, the need for more tests. Vaccines  Your health care provider may recommend certain vaccines, such as: Influenza vaccine. This is recommended every year. Tetanus, diphtheria, and acellular pertussis (Tdap, Td) vaccine. You may need a Td booster every 10 years. Zoster vaccine. You may need this after age 73. Pneumococcal 13-valent conjugate (PCV13) vaccine. One dose is recommended after age 49. Pneumococcal polysaccharide (PPSV23) vaccine. One dose is recommended after age 62. Talk to your health care  provider about which screenings and vaccines you need and how  often you need them. This information is not intended to replace advice given to you by your health care provider. Make sure you discuss any questions you have with your health care provider. Document Released: 01/17/2015 Document Revised: 09/10/2015 Document Reviewed: 10/22/2014 Elsevier Interactive Patient Education  2017 [place] Prevention in the Home Falls can cause injuries. They can happen to people of all ages. There are many things you can do to make your home safe and to help prevent falls. What can I do on the outside of my home? Regularly fix the edges of walkways and driveways and fix any cracks. Remove anything that might make you trip as you walk through a door, such as a raised step or threshold. Trim any bushes or trees on the path to your home. Use bright outdoor lighting. Clear any walking paths of anything that might make someone trip, such as rocks or tools. Regularly check to see if handrails are loose or broken. Make sure that both sides of any steps have handrails. Any raised decks and porches should have guardrails on the edges. Have any leaves, snow, or ice cleared regularly. Use sand or salt on walking paths during winter. Clean up any spills in your garage right away. This includes oil or grease spills. What can I do in the bathroom? Use night lights. Install grab bars by the toilet and in the tub and shower. Do not use towel bars as grab bars. Use non-skid mats or decals in the tub or shower. If you need to sit down in the shower, use a plastic, non-slip stool. Keep the floor dry. Clean up any water that spills on the floor as soon as it happens. Remove soap buildup in the tub or shower regularly. Attach bath mats securely with double-sided non-slip rug tape. Do not have throw rugs and other things on the floor that can make you trip. What can I do in the bedroom? Use night lights. Make sure that you have a light by your bed that is easy to  reach. Do not use any sheets or blankets that are too big for your bed. They should not hang down onto the floor. Have a firm chair that has side arms. You can use this for support while you get dressed. Do not have throw rugs and other things on the floor that can make you trip. What can I do in the kitchen? Clean up any spills right away. Avoid walking on wet floors. Keep items that you use a lot in easy-to-reach places. If you need to reach something above you, use a strong step stool that has a grab bar. Keep electrical cords out of the way. Do not use floor polish or wax that makes floors slippery. If you must use wax, use non-skid floor wax. Do not have throw rugs and other things on the floor that can make you trip. What can I do with my stairs? Do not leave any items on the stairs. Make sure that there are handrails on both sides of the stairs and use them. Fix handrails that are broken or loose. Make sure that handrails are as long as the stairways. Check any carpeting to make sure that it is firmly attached to the stairs. Fix any carpet that is loose or worn. Avoid having throw rugs at the top or bottom of the stairs. If you do have throw rugs, attach them to the  floor with carpet tape. Make sure that you have a light switch at the top of the stairs and the bottom of the stairs. If you do not have them, ask someone to add them for you. What else can I do to help prevent falls? Wear shoes that: Do not have high heels. Have rubber bottoms. Are comfortable and fit you well. Are closed at the toe. Do not wear sandals. If you use a stepladder: Make sure that it is fully opened. Do not climb a closed stepladder. Make sure that both sides of the stepladder are locked into place. Ask someone to hold it for you, if possible. Clearly mark and make sure that you can see: Any grab bars or handrails. First and last steps. Where the edge of each step is. Use tools that help you move  around (mobility aids) if they are needed. These include: Canes. Walkers. Scooters. Crutches. Turn on the lights when you go into a dark area. Replace any light bulbs as soon as they burn out. Set up your furniture so you have a clear path. Avoid moving your furniture around. If any of your floors are uneven, fix them. If there are any pets around you, be aware of where they are. Review your medicines with your doctor. Some medicines can make you feel dizzy. This can increase your chance of falling. Ask your doctor what other things that you can do to help prevent falls. This information is not intended to replace advice given to you by your health care provider. Make sure you discuss any questions you have with your health care provider. Document Released: 10/17/2008 Document Revised: 05/29/2015 Document Reviewed: 01/25/2014 Elsevier Interactive Patient Education  2017 [employer].

## 2020-08-12 NOTE — Progress Notes (Signed)
Subjective:   Tanya [name] is a 70 y.o. female who presents for Medicare Annual (Subsequent) preventive examination.  I connected with Jarold Motto today by telephone and verified that I am speaking with the correct person using two identifiers. Location patient: home Location provider: work Persons participating in the virtual visit: patient, provider.   I discussed the limitations, risks, security and privacy concerns of performing an evaluation and management service by telephone and the availability of in person appointments. I also discussed with the patient that there may be a patient responsible charge related to this service. The patient expressed understanding and verbally consented to this telephonic visit.    Interactive audio and video telecommunications were attempted between this provider and patient, however failed, due to patient having technical difficulties OR patient did not have access to video capability.  We continued and completed visit with audio only.    Review of Systems    N/a       Objective:    There were no vitals filed for this visit. There is no height or weight on file to calculate BMI.  Advanced Directives 04/26/2016 12/08/2015 10/13/2015 11/18/2014 11/11/2014 11/11/2014 09/23/2014  Does Patient Have a Medical Advance Directive? Yes Yes Yes Yes Yes No Yes  Type of Paramedic of [place];Living will - - Healthcare Power of [place] in Chart? - - - No - copy requested - - -    Current Medications (verified) Outpatient Encounter Medications as of 08/12/2020  Medication Sig   calcium carbonate (OS-CAL - DOSED IN MG OF ELEMENTAL CALCIUM) 1250 (500 Ca) MG tablet Take 1 tablet by mouth.   Calcium Carbonate-Vitamin D 600-400 MG-UNIT tablet Take 1 tablet by mouth in the morning and at bedtime.   Cholecalciferol (VITAMIN D) 50 MCG (2000 UT) CAPS Take 1 capsule by  mouth daily.   fluticasone (FLONASE) 50 MCG/ACT nasal spray Place 2 sprays into both nostrils daily as needed for allergies or rhinitis.   ibuprofen (ADVIL) 200 MG tablet Take 1 tablet (200 mg total) by mouth every 6 (six) hours as needed.   Misc Natural Products (OSTEO BI-FLEX TRIPLE STRENGTH) TABS Take 1 tablet by mouth 2 (two) times daily.   Multiple Vitamin (MULTIVITAMIN) tablet Take 1 tablet by mouth daily.   simvastatin (ZOCOR) 10 MG tablet TAKE 1 TABLET BY MOUTH EVERYDAY AT BEDTIME   No facility-administered encounter medications on file as of 08/12/2020.    Allergies (verified) Codeine and Tetanus toxoid   History: Past Medical History:  Diagnosis Date   Arthritis    left knee and hip   Breast cancer ([place])    Radiation and lumpectomy   COPD, mild ([place])    Family history of breast cancer    Family history of uterine cancer    Hyperlipidemia    Osteopenia    DEXA 10/27/11: -1.4, no change from 10/2009   Shingles    Smoker    Past Surgical History:  Procedure Laterality Date   BREAST BIOPSY     BREAST LUMPECTOMY     COLONOSCOPY     DILATION AND EVACUATION     MASTECTOMY Left AUG 2016   TONSILLECTOMY AND ADENOIDECTOMY     Family History  Problem Relation Age of Onset   CAD Mother        stent older age   Hypertension Mother    Breast cancer Mother 77   Cancer  Mother        SKIN and BLADDER    Heart disease Paternal Grandfather    Lung cancer Paternal Grandfather    Hypertension Father    Heart disease Father 65       CHF    Dementia Father    Hearing loss Maternal Grandfather    Parkinson's disease Paternal Grandmother    Colon cancer Neg Hx    Esophageal cancer Neg Hx    Stomach cancer Neg Hx    Rectal cancer Neg Hx    Social History   Socioeconomic History   Marital status: Widowed    Spouse name: Not on file   Number of children: 0   Years of education: Not on file   Highest education level: Not on file  Occupational History   Not on file   Tobacco Use   Smoking status: Every Day    Packs/day: 1.00    Years: 49.00    Pack years: 49.00    Types: Cigarettes   Smokeless tobacco: Never   Tobacco comments:    Trying to smoke less.   Vaping Use   Vaping Use: Never used  Substance and Sexual Activity   Alcohol use: Yes    Alcohol/week: 14.0 standard drinks    Types: 14 Standard drinks or equivalent per week    Comment: 5 DRINKS A WEEK   Drug use: No   Sexual activity: Not Currently  Other Topics Concern   Not on file  Social History Narrative   Lives at home with husband who is disabled.     Social Determinants of Health   Financial Resource Strain: Not on file  Food Insecurity: Not on file  Transportation Needs: Not on file  Physical Activity: Not on file  Stress: Not on file  Social Connections: Not on file    Tobacco Counseling Ready to quit: Not Answered Counseling given: Not Answered Tobacco comments: Trying to smoke less.    Clinical Intake:                 Diabetic?no         Activities of Daily Living In your present state of health, do you have any difficulty performing the following activities: 07/16/2020  Hearing? N  Vision? N  Difficulty concentrating or making decisions? N  Walking or climbing stairs? N  Dressing or bathing? N  Doing errands, shopping? N  Some recent data might be hidden    Patient Care Team: [name], [name], MD as PCP - General (Family Medicine) [name], [name], MD as Consulting Physician (Oncology) [name], MD as Referring Physician (Surgery) [name], MD as Consulting Physician (Orthopedic Surgery) [name], MD as Consulting Physician (Obstetrics and Gynecology)  Indicate any recent Medical Services you may have received from other than [facility] providers in the past year (date may be approximate).     Assessment:   This is a routine wellness examination for [place].  Hearing/Vision screen No results  found.  Dietary issues and exercise activities discussed:     Goals Addressed   None    Depression Screen PHQ 2/9 Scores 07/16/2020 07/16/2019 05/03/2018 05/02/2017 04/26/2016 11/19/2013  PHQ - 2 Score 0 0 0 0 0 0    Fall Risk Fall Risk  07/16/2020 07/16/2019 05/03/2018 05/02/2017 04/26/2016  Falls in the past year? 0 0 0 No No  Number falls in past yr: - - 0 - -  Injury with Fall? - - 0 - -  Follow up - - Education provided - -    FALL RISK PREVENTION PERTAINING TO THE HOME:  Any stairs in or around the home? No  If so, are there any without handrails? No  Home free of loose throw rugs in walkways, pet beds, electrical cords, etc? Yes  Adequate lighting in your home to reduce risk of falls? Yes   ASSISTIVE DEVICES UTILIZED TO PREVENT FALLS:  Life alert? No  Use of a cane, walker or w/c? No  Grab bars in the bathroom? No  Shower chair or bench in shower? No  Elevated toilet seat or a handicapped toilet? No    Cognitive Function:  Normal cognitive status assessed by direct observation by this Nurse Health Advisor. No abnormalities found.        Immunizations Immunization History  Administered Date(s) Administered   Influenza Split 10/05/2011   Influenza Whole 09/04/2008   Influenza, High Dose Seasonal PF 10/18/2017, 10/09/2018, 10/09/2019   Influenza,inj,Quad PF,6+ Mos 10/10/2012, 09/18/2013, 12/16/2014, 10/13/2015, 09/15/2016   PFIZER(Purple Top)SARS-COV-2 Vaccination 02/09/2019, 03/07/2019, 10/15/2019   Pneumococcal Conjugate-13 05/02/2017   Pneumococcal Polysaccharide-23 11/04/2008, 08/03/2018   Zoster Recombinat (Shingrix) 05/10/2017, 08/03/2017   Zoster, Live 03/17/2012    TDAP status: Due, Education has been provided regarding the importance of this vaccine. Advised may receive this vaccine at local pharmacy or Health Dept. Aware to provide a copy of the vaccination record if obtained from local pharmacy or Health Dept. Verbalized acceptance and  understanding.  Flu Vaccine status: Up to date  Pneumococcal vaccine status: Up to date  Covid-19 vaccine status: Completed vaccines  Qualifies for Shingles Vaccine? Yes   Zostavax completed No   Shingrix Completed?: No.    Education has been provided regarding the importance of this vaccine. Patient has been advised to call insurance company to determine out of pocket expense if they have not yet received this vaccine. Advised may also receive vaccine at local pharmacy or Health Dept. Verbalized acceptance and understanding.  Screening Tests Health Maintenance  Topic Date Due   COVID-19 Vaccine (4 - Booster for Pfizer series) 01/15/2020   INFLUENZA VACCINE  08/04/2020   MAMMOGRAM  03/26/2022   COLONOSCOPY (Pts 45-34yr Insurance coverage will need to be confirmed)  12/17/2027   DEXA SCAN  Completed   Hepatitis C Screening  Completed   PNA vac Low Risk Adult  Completed   Zoster Vaccines- Shingrix  Completed   HPV VACCINES  Aged Out    Health Maintenance  Health Maintenance Due  Topic Date Due   COVID-19 Vaccine (4 - Booster for Pfizer series) 01/15/2020   INFLUENZA VACCINE  08/04/2020    Colorectal cancer screening: Type of screening: Colonoscopy. Completed 12/16/2017. Repeat every 10 years  Mammogram status: Completed 03/25/2020. Repeat every year  Bone Density status: Completed 01/01/2020. Results reflect: Bone density results: OSTEOPENIA. Repeat every 5 years.  Lung Cancer Screening: (Low Dose CT Chest recommended if Age 68-80years, 30 pack-year currently smoking OR have quit w/in 15years.) does not qualify.   Lung Cancer Screening Referral: n/a  Additional Screening:  Hepatitis C Screening: does not qualify; Completed 06/08/2013  Vision Screening: Recommended annual ophthalmology exams for early detection of glaucoma and other disorders of the eye. Is the patient up to date with their annual eye exam?  Yes  Who is the provider or what is the name of the office  in which the patient attends annual eye exams? Dr.Grote  If pt is not established with a provider, would they like  to be referred to a provider to establish care? No .   Dental Screening: Recommended annual dental exams for proper oral hygiene  Community Resource Referral / Chronic Care Management: CRR required this visit?  No   CCM required this visit?  No      Plan:     I have personally reviewed and noted the following in the patient's chart:   Medical and social history Use of alcohol, tobacco or illicit drugs  Current medications and supplements including opioid prescriptions.  Functional ability and status Nutritional status Physical activity Advanced directives List of other physicians Hospitalizations, surgeries, and ER visits in previous 12 months Vitals Screenings to include cognitive, depression, and falls Referrals and appointments  In addition, I have reviewed and discussed with patient certain preventive protocols, quality metrics, and best practice recommendations. A written personalized care plan for preventive services as well as general preventive health recommendations were provided to patient.     Randel Pigg, LPN   [id number]   Nurse Notes: none

## 2020-08-15 ENCOUNTER — Telehealth: Payer: Self-pay | Admitting: Family Medicine

## 2020-08-15 NOTE — Telephone Encounter (Addendum)
Patient called today stating that after looking at her after visit summary from her AWV from 8/9. She says that she has some questions concerning some discrespancies on the AVS.  She would like for Mickel Baas to give her a call at [phone] when she returns to the office to discuss her visit.  Patient says that she has an appointment Monday morning but anytime after 12pm should be fine to give her a call.   Please advise.

## 2020-08-19 ENCOUNTER — Telehealth: Payer: Self-pay

## 2020-08-19 NOTE — Telephone Encounter (Signed)
Returned call to patient .  Patient had questions regarding AWV , answered all her questions and concerns .      L.Nandan Willems LPN

## 2020-08-28 ENCOUNTER — Encounter: Payer: Self-pay | Admitting: Obstetrics & Gynecology

## 2020-08-28 ENCOUNTER — Ambulatory Visit (INDEPENDENT_AMBULATORY_CARE_PROVIDER_SITE_OTHER): Payer: PPO | Admitting: Obstetrics & Gynecology

## 2020-08-28 ENCOUNTER — Other Ambulatory Visit: Payer: Self-pay

## 2020-08-28 VITALS — BP 116/70 | HR 86 | Resp 16 | Ht 64.25 in | Wt 127.0 lb

## 2020-08-28 DIAGNOSIS — Z9289 Personal history of other medical treatment: Secondary | ICD-10-CM

## 2020-08-28 DIAGNOSIS — M8589 Other specified disorders of bone density and structure, multiple sites: Secondary | ICD-10-CM | POA: Diagnosis not present

## 2020-08-28 DIAGNOSIS — Z01419 Encounter for gynecological examination (general) (routine) without abnormal findings: Secondary | ICD-10-CM

## 2020-08-28 DIAGNOSIS — Z170 Estrogen receptor positive status [ER+]: Secondary | ICD-10-CM | POA: Diagnosis not present

## 2020-08-28 DIAGNOSIS — F17210 Nicotine dependence, cigarettes, uncomplicated: Secondary | ICD-10-CM | POA: Diagnosis not present

## 2020-08-28 DIAGNOSIS — Z780 Asymptomatic menopausal state: Secondary | ICD-10-CM | POA: Diagnosis not present

## 2020-08-28 DIAGNOSIS — C50412 Malignant neoplasm of upper-outer quadrant of left female breast: Secondary | ICD-10-CM

## 2020-08-28 NOTE — Progress Notes (Signed)
Tanya [name] [birth year] [id number]   History:    70 y.o. G3P0A3.  Husband passed away 2018/10/27.   RP:  Established patient presenting for annual gyn exam    HPI: Postmenopause, well on no HRT.  No PMB.  No pelvic pain. Abstinent. Urine/BMs wnl.  H/O Left recurrent Breast Cancer followed by Dr Jana Hakim.  S/P Left Mastectomy.  Still smoking cigarettes, trying to decrease. Chantix not tolerated in the past.  BMI 21.63.  BD 12/2019 Osteopenia, T-Score -2.1.  Took Alendronate in the past which caused joint pains.  Planning hip replacement.    Past medical history,surgical history, family history and social history were all reviewed and documented in the EPIC chart.  Gynecologic History No LMP recorded. Patient is postmenopausal.  Obstetric History OB History  Gravida Para Term Preterm AB Living  3 0     2 0  SAB IAB Ectopic Multiple Live Births  1            # Outcome Date GA Lbr Len/2nd Weight Sex Delivery Anes PTL Lv  3 SAB           2 AB           1 Gravida              ROS: A ROS was performed and pertinent positives and negatives are included in the history.  GENERAL: No fevers or chills. HEENT: No change in vision, no earache, sore throat or sinus congestion. NECK: No pain or stiffness. CARDIOVASCULAR: No chest pain or pressure. No palpitations. PULMONARY: No shortness of breath, cough or wheeze. GASTROINTESTINAL: No abdominal pain, nausea, vomiting or diarrhea, melena or bright red blood per rectum. GENITOURINARY: No urinary frequency, urgency, hesitancy or dysuria. MUSCULOSKELETAL: No joint or muscle pain, no back pain, no recent trauma. DERMATOLOGIC: No rash, no itching, no lesions. ENDOCRINE: No polyuria, polydipsia, no heat or cold intolerance. No recent change in weight. HEMATOLOGICAL: No anemia or easy bruising or bleeding. NEUROLOGIC: No headache, seizures, numbness, tingling or weakness. PSYCHIATRIC: No depression, no loss of interest in normal activity or change in  sleep pattern.     Exam:   BP 116/70   Pulse 86   Resp 16   Ht 5' 4.25" (1.632 m)   Wt 127 lb (57.6 kg)   BMI 21.63 kg/m   Body mass index is 21.63 kg/m.  General appearance : Well developed well nourished female. No acute distress HEENT: Eyes: no retinal hemorrhage or exudates,  Neck supple, trachea midline, no carotid bruits, no thyroidmegaly Lungs: Clear to auscultation, no rhonchi or wheezes, or rib retractions  Heart: Regular rate and rhythm, no murmurs or gallops Breast:Examined in sitting and supine position, Rt normal in appearance, no palpable masses or tenderness,  no skin retraction, no nipple inversion, no nipple discharge, no skin discoloration, no axillary or supraclavicular lymphadenopathy.  S/P Left Mastectomy. Abdomen: no palpable masses or tenderness, no rebound or guarding Extremities: no edema or skin discoloration or tenderness  Pelvic: Vulva: Normal             Vagina: No gross lesions or discharge  Cervix: No gross lesions or discharge  Uterus  AV, normal size, shape and consistency, non-tender and mobile  Adnexa  Without masses or tenderness  Anus: Normal   Assessment/Plan:  70 y.o. female for annual exam   1. Well female exam with routine gynecological exam Normal gynecologic exam in menopause.  Pap test August 2021 was negative, no indication  to repeat this year.  Breast exam status post left mastectomy, right breast normal.  Screening right mammogram negative in March 2022.  Colonoscopy 2019.  Health labs with family physician.  2. Postmenopause Well on no hormone replacement therapy.  No postmenopausal bleeding.  3. Osteopenia of multiple sites Osteopenia with a T score of -2.1 in December 2021.  We will repeat a bone density in December 2023.  Recommend continuing on vitamin D, calcium intake of 1.5 g/day and regular weightbearing physical activities.  Strongly recommended to quit smoking.  4. Cigarette smoker Counseling done on cigarette  smoking and the importance of quitting.  Importance of quitting for many reasons including osteopenia and the surgical risks with the upcoming hip replacement.  5. Malignant neoplasm of upper-outer quadrant of left breast in female, estrogen receptor positive ([place]) Left breast cancer status post left mastectomy followed by Dr. Jana Hakim.  Other orders - ASPIRIN 81 PO; Take by mouth.   [name] MD, 9:08 AM 08/28/2020

## 2020-08-29 ENCOUNTER — Ambulatory Visit (INDEPENDENT_AMBULATORY_CARE_PROVIDER_SITE_OTHER)
Admission: RE | Admit: 2020-08-29 | Discharge: 2020-08-29 | Disposition: A | Discharge status: Home / Self Care | Payer: PPO | Source: Ambulatory Visit | Attending: Acute Care | Admitting: Acute Care

## 2020-08-29 DIAGNOSIS — F17210 Nicotine dependence, cigarettes, uncomplicated: Secondary | ICD-10-CM

## 2020-08-29 DIAGNOSIS — Z87891 Personal history of nicotine dependence: Secondary | ICD-10-CM | POA: Diagnosis not present

## 2020-09-10 NOTE — Progress Notes (Signed)
Please call patient and let them  know their  low dose Ct was read as a Lung RADS 2: nodules that are benign in appearance and behavior with a very low likelihood of becoming a clinically active cancer due to size or lack of growth. Recommendation per radiology is for a repeat LDCT in 12 months. .Please let them  know we will order and schedule their  annual screening scan for 08/2021. Please let them  know there was notation of CAD on their  scan.  Please remind the patient  that this is a non-gated exam therefore degree or severity of disease  cannot be determined. Please have them  follow up with their PCP regarding potential risk factor modification, dietary therapy or pharmacologic therapy if clinically indicated. Pt.  is  currently on statin therapy. Please place order for annual  screening scan for  08/2021 and fax results to PCP. Thanks so much.  + CAD, + statin, No cardiology notes. Please have patient follow up with PCP. Thanks so much

## 2020-09-11 ENCOUNTER — Encounter: Payer: Self-pay | Admitting: *Deleted

## 2020-09-11 DIAGNOSIS — Z87891 Personal history of nicotine dependence: Secondary | ICD-10-CM

## 2020-09-11 DIAGNOSIS — F17210 Nicotine dependence, cigarettes, uncomplicated: Secondary | ICD-10-CM

## 2020-09-12 ENCOUNTER — Encounter: Payer: Self-pay | Admitting: Family Medicine

## 2020-09-24 ENCOUNTER — Other Ambulatory Visit: Payer: Self-pay | Admitting: Orthopaedic Surgery

## 2020-09-24 DIAGNOSIS — Z01818 Encounter for other preprocedural examination: Secondary | ICD-10-CM

## 2020-09-26 ENCOUNTER — Telehealth: Payer: Self-pay

## 2020-09-26 NOTE — Telephone Encounter (Signed)
Patient needs a surgical clearance appointment, can you help her get scheduled? Thank you!!

## 2020-09-30 ENCOUNTER — Ambulatory Visit: Payer: PPO | Admitting: Family Medicine

## 2020-10-06 ENCOUNTER — Other Ambulatory Visit: Payer: Self-pay

## 2020-10-07 ENCOUNTER — Ambulatory Visit (INDEPENDENT_AMBULATORY_CARE_PROVIDER_SITE_OTHER): Payer: PPO | Admitting: Family Medicine

## 2020-10-07 ENCOUNTER — Encounter: Payer: Self-pay | Admitting: Family Medicine

## 2020-10-07 VITALS — BP 126/70 | HR 83 | Temp 97.8°F | Resp 16 | Ht 64.25 in | Wt 128.0 lb

## 2020-10-07 DIAGNOSIS — Z23 Encounter for immunization: Secondary | ICD-10-CM | POA: Diagnosis not present

## 2020-10-07 DIAGNOSIS — Z01818 Encounter for other preprocedural examination: Secondary | ICD-10-CM | POA: Diagnosis not present

## 2020-10-07 DIAGNOSIS — F17200 Nicotine dependence, unspecified, uncomplicated: Secondary | ICD-10-CM

## 2020-10-07 MED ORDER — NICOTINE 14 MG/24HR TD PT24: 14.0000 mg | MEDICATED_PATCH | Freq: Every day | TRANSDERMAL | 0 refills | Status: DC

## 2020-10-07 NOTE — Progress Notes (Signed)
HPI:  Ms.Riah E Cizek is a 70 y.o. female with hx of tobacco use, OA,HLD,and COPD here today for surgical clearance requested by Dr Rhona Raider. She is planning on having left total arthroplastic surgery under spinal anesthesia on 10/28/20.  Left hip pain for about 5 years and it is getting worse, limiting some of her ADL's. Surgery will be performed under spinal anesthesia and sedation.  Past Surgical History:  Procedure Laterality Date   BREAST BIOPSY     BREAST LUMPECTOMY     COLONOSCOPY     DILATION AND EVACUATION     MASTECTOMY Left AUG 2016   TONSILLECTOMY AND ADENOIDECTOMY     General anesthesia in the past has caused severe nausea.  She has pre op labs already ordered and to be done in the hospital a few days before surgery.  + Smoker. Occasional cough, productive and intermittent for years,ascribed to tobacco use.  Negative for Hx of CAD,CKD,or CHF. Denies any CP,SOB,palpitations, diaphoresis,or dizziness when climbing a flight of stairs,walking a hill,or when carrying heavy groceries. Negative for orthopnea or PND. Hx of abnormal EKG, incomplete RBBB.  Review of Systems  Constitutional:  Negative for activity change, appetite change, fatigue and fever.  HENT:  Negative for mouth sores, nosebleeds and sore throat.   Eyes:  Negative for redness and visual disturbance.  Respiratory:  Negative for cough and wheezing.   Cardiovascular:  Negative for leg swelling.  Gastrointestinal:  Negative for abdominal pain, nausea and vomiting.       Negative for changes in bowel habits.  Genitourinary:  Negative for decreased urine volume, dysuria and hematuria.  Neurological:  Negative for seizures, syncope, weakness, numbness and headaches.  Rest of ROS, see pertinent positives sand negatives in HPI  Current Outpatient Medications on File Prior to Visit  Medication Sig Dispense Refill   Calcium Carbonate-Vitamin D 600-400 MG-UNIT tablet Take 1 tablet by mouth in the  morning and at bedtime.     ibuprofen (ADVIL) 200 MG tablet Take 400 mg by mouth every 8 (eight) hours as needed (pain). 30 tablet 0   Misc Natural Products (OSTEO BI-FLEX TRIPLE STRENGTH) TABS Take 1 tablet by mouth 2 (two) times daily.     simvastatin (ZOCOR) 10 MG tablet TAKE 1 TABLET BY MOUTH EVERYDAY AT BEDTIME 90 tablet 3   aspirin EC 81 MG tablet Take 81 mg by mouth in the morning. Swallow whole.     carboxymethylcellulose (REFRESH PLUS) 0.5 % SOLN Place 1 drop into both eyes 3 (three) times daily as needed (tired/dry/irritated eyes.).     cholecalciferol (VITAMIN D3) 25 MCG (1000 UNIT) tablet Take 1,000 Units by mouth in the morning and at bedtime.     Multiple Vitamin (MULTIVITAMIN WITH MINERALS) TABS tablet Take 1 tablet by mouth daily with breakfast.     No current facility-administered medications on file prior to visit.   Past Medical History:  Diagnosis Date   Arthritis    left knee and hip   Breast cancer ([place])    Radiation and lumpectomy   COPD, mild ([place])    Family history of breast cancer    Family history of uterine cancer    Hyperlipidemia    Osteopenia    DEXA 10/27/11: -1.4, no change from 10/2009   Shingles    Smoker    Allergies  Allergen Reactions   Tetanus Toxoid Other (See Comments)    shock   Codeine Rash    Social History   Socioeconomic History  Marital status: Widowed    Spouse name: Not on file   Number of children: 0   Years of education: Not on file   Highest education level: Not on file  Occupational History   Not on file  Tobacco Use   Smoking status: Every Day    Packs/day: 1.00    Years: 49.00    Pack years: 49.00    Types: Cigarettes   Smokeless tobacco: Never   Tobacco comments:    Trying to smoke less.   Vaping Use   Vaping Use: Never used  Substance and Sexual Activity   Alcohol use: Not Currently    Comment: daily   Drug use: No   Sexual activity: Not Currently    Birth control/protection: Post-menopausal  Other  Topics Concern   Not on file  Social History Narrative   Lives at home with husband who is disabled.     Social Determinants of Health   Financial Resource Strain: Low Risk    Difficulty of Paying Living Expenses: Not hard at all  Food Insecurity: No Food Insecurity   Worried About Charity fundraiser in the Last Year: Never true   [place] in the Last Year: Never true  Transportation Needs: No Transportation Needs   Lack of Transportation (Medical): No   Lack of Transportation (Non-Medical): No  Physical Activity: Inactive   Days of Exercise per Week: 0 days   Minutes of Exercise per Session: 0 min  Stress: No Stress Concern Present   Feeling of Stress : Not at all  Social Connections: Socially Isolated   Frequency of Communication with Friends and Family: Three times a week   Frequency of Social Gatherings with Friends and Family: Three times a week   Attends Religious Services: Never   Active Member of Clubs or Organizations: No   Attends Archivist Meetings: Never   Marital Status: Widowed   Vitals:   10/07/20 1026  BP: 126/70  Pulse: 83  Resp: 16  Temp: 97.8 F (36.6 C)  SpO2: 98%   Body mass index is 21.8 kg/m.  Physical Exam Vitals and nursing note reviewed.  Constitutional:      General: She is not in acute distress.    Appearance: She is well-developed and normal weight.  HENT:     Head: Normocephalic and atraumatic.     Mouth/Throat:     Mouth: Mucous membranes are moist.     Pharynx: Oropharynx is clear.  Eyes:     Conjunctiva/sclera: Conjunctivae normal.  Cardiovascular:     Rate and Rhythm: Normal rate and regular rhythm.     Pulses:          Dorsalis pedis pulses are 2+ on the right side and 2+ on the left side.     Heart sounds: No murmur heard. Pulmonary:     Effort: Pulmonary effort is normal. No respiratory distress.     Breath sounds: Normal breath sounds.  Abdominal:     Palpations: Abdomen is soft. There is no  hepatomegaly or mass.     Tenderness: There is no abdominal tenderness.  Musculoskeletal:     Left hip: Decreased range of motion.     Comments: Antalgic gait, not assisted.  Lymphadenopathy:     Cervical: No cervical adenopathy.  Skin:    General: Skin is warm.     Findings: No erythema or rash.  Neurological:     General: No focal deficit present.  Mental Status: She is alert and oriented to person, place, and time.     Cranial Nerves: No cranial nerve deficit.  Psychiatric:     Comments: Well groomed, good eye contact.   ASSESSMENT AND PLAN:  Ms. VEGA STARE was seen today for pre op evaluation.  Orders Placed This Encounter  Procedures   Flu Vaccine QUAD High Dose(Fluad)   EKG 12-Lead   1. Pre-operative clearance EKG today: Sinus rhythm, incomplete RBBB, deep S wave in V5 and V6? Pulmonary disease. I do not think CXR is needed at this time, no acute symptoms and recently had low dose CT for lung cancer screening (08/30/20). No changes when compared with EKG done on 04/03/2014 and 03/17/2012.  DVT prophylaxis: Early ambulation and oral anticoagulation with eliquis (2.5 mg  bid x 35 d) or Xarelto (10 mg daily x 35 days).  Smoking cessation encouraged, recommend nicotine patches to start using a few days before surgery. She was instructed to stop NSAIDs, OTC supplements and multivitamins, and avoid Aspirin at least 7 days before procedure. She is having preop labs in the hospital, I am not expecting significant abnormalities that preclude her from having THA. She really feels like surgery will improved her quality of life, so cleared for THA.  Preop form will be completed and faxed with copy of office note to Dr. Rhona Raider.  2. TOBACCO ABUSE We discussed adverse effect of tobacco use as well as benefits of smoking cessation. She is not having COPD symptoms, except for occasional cough, unchanged for years. Encouraged smoking cessation. Nicotine patches sent to her  pharmacy.  - nicotine (NICODERM CQ - DOSED IN MG/24 HOURS) 14 mg/24hr patch; Place 1 patch (14 mg total) onto the skin daily.  Dispense: 28 patch; Refill: 0  3. Need for influenza vaccination - Flu Vaccine QUAD High Dose(Fluad)  Return if symptoms worsen or fail to improve, for Keep next appt.   Makinzie Considine G. [name], MD  [facility]. [place] office.

## 2020-10-07 NOTE — Patient Instructions (Addendum)
A few things to remember from today's visit:   Pre-operative clearance - Plan: EKG 12-Lead  Need for influenza vaccination - Plan: Flu Vaccine QUAD High Dose(Fluad)  If you need refills please call your pharmacy. Do not use My Chart to request refills or for acute issues that need immediate attention.   No aspirin, Ibuprofen,or aleve at least for 7 days before procedure. Stop all over the counter supplements and vit. Just Tylenol if needed for pain. Smoking cessation is very important for healing process and to decrease risk of complications. You can try Nicotine patches.  Please be sure medication list is accurate. If a new problem present, please set up appointment sooner than planned today.

## 2020-10-12 ENCOUNTER — Encounter: Payer: Self-pay | Admitting: Family Medicine

## 2020-10-13 NOTE — Patient Instructions (Addendum)
DUE TO COVID-19 ONLY ONE VISITOR IS ALLOWED TO COME WITH YOU AND STAY IN THE WAITING ROOM ONLY DURING PRE OP AND PROCEDURE DAY OF SURGERY IF YOU ARE GOING HOME AFTER SURGERY.   IF YOU ARE SPENDING THE NIGHT 2 PEOPLE MAY VISIT WITH YOU IN YOUR PRIVATE ROOM AFTER SURGERY UNTIL VISITING  HOURS ARE OVER AT 800 PM AND ONE VISITORS CAN SPEND THE NIGHT They have to be in the patients room by [place] procedure is scheduled on: 10/28/20   Report to [facility] Main  Entrance   Report to Short Stay at 5:15 AM     Call this number if you have problems the morning of surgery [phone]    No food after midnight.    You may have clear liquid until 4:30 AM.    At 4:00 AM drink pre surgery drink.   Nothing by mouth after 4:30 AM.   CLEAR LIQUID DIET   Foods Allowed                                                                     Foods Excluded                                                                                               liquids that you cannot  Plain Jell-O any favor except red or purple                                           see through such as: Fruit ices (not with fruit pulp)                                                 milk, soups, orange juice  Iced Popsicles                                                     All solid food Carbonated beverages, regular and diet                                    Cranberry, grape and apple juices Sports drinks like Gatorade Lightly seasoned clear broth or consume(fat free) Sugar     BRUSH YOUR TEETH MORNING OF SURGERY AND RINSE YOUR MOUTH OUT,  NO CHEWING GUM CANDY OR MINTS.     Take these medicines the morning of surgery with A SIP OF WATER: none                                You may not have any metal on your body including hair pins and              piercings  Do not wear jewelry, make-up, lotions, powders or perfumes, deodorant             Do not wear nail polish on  your fingernails.  Do not shave  48 hours prior to surgery.                Do not bring valuables to the hospital. [place].  Contacts, dentures or bridgework may not be worn into surgery.       Patients discharged the day of surgery will not be allowed to drive home.  IF YOU ARE HAVING SURGERY AND GOING HOME THE SAME DAY, YOU MUST HAVE AN ADULT TO DRIVE YOU HOME AND BE WITH YOU FOR 24 HOURS. YOU MAY GO HOME BY TAXI OR UBER OR ORTHERWISE, BUT AN ADULT MUST ACCOMPANY YOU HOME AND STAY WITH YOU FOR 24 HOURS.  Name and phone number of your driver:  Special Instructions: N/A              Please read over the following fact sheets you were given: _____________________________________________________________________             [facility] - Preparing for Surgery Before surgery, you can play an important role.  Because skin is not sterile, your skin needs to be as free of germs as possible.  You can reduce the number of germs on your skin by washing with CHG (chlorahexidine gluconate) soap before surgery.  CHG is an antiseptic cleaner which kills germs and bonds with the skin to continue killing germs even after washing. Please DO NOT use if you have an allergy to CHG or antibacterial soaps.  If your skin becomes reddened/irritated stop using the CHG and inform your nurse when you arrive at Short Stay. Do not shave (including legs and underarms) for at least 48 hours prior to the first CHG shower.   Please follow these instructions carefully:  1.  Shower with CHG Soap the night before surgery and the  morning of Surgery.  2.  If you choose to wash your hair, wash your hair first as usual with your  normal  shampoo.  3.  After you shampoo, rinse your hair and body thoroughly to remove the  shampoo.                            4.  Use CHG as you would any other liquid soap.  You can apply chg directly  to the skin and wash                        Gently with a scrungie or clean washcloth.  5.  Apply the CHG Soap to your body ONLY FROM THE NECK DOWN.   Do not use on face/ open  Wound or open sores. Avoid contact with eyes, ears mouth and genitals (private parts).                       Wash face,  Genitals (private parts) with your normal soap.             6.  Wash thoroughly, paying special attention to the area where your surgery  will be performed.  7.  Thoroughly rinse your body with warm water from the neck down.  8.  DO NOT shower/wash with your normal soap after using and rinsing off  the CHG Soap.                9.  Pat yourself dry with a clean towel.            10.  Wear clean pajamas.            11.  Place clean sheets on your bed the night of your first shower and do not  sleep with pets. Day of Surgery : Do not apply any lotions/deodorants the morning of surgery.  Please wear clean clothes to the hospital/surgery center.  FAILURE TO FOLLOW THESE INSTRUCTIONS MAY RESULT IN THE CANCELLATION OF YOUR SURGERY PATIENT SIGNATURE_________________________________  NURSE SIGNATURE__________________________________  ________________________________________________________________________   Adam Phenix  An incentive spirometer is a tool that can help keep your lungs clear and active. This tool measures how well you are filling your lungs with each breath. Taking long deep breaths may help reverse or decrease the chance of developing breathing (pulmonary) problems (especially infection) following: A long period of time when you are unable to move or be active. BEFORE THE PROCEDURE  If the spirometer includes an indicator to show your best effort, your nurse or respiratory therapist will set it to a desired goal. If possible, sit up straight or lean slightly forward. Try not to slouch. Hold the incentive spirometer in an upright position. INSTRUCTIONS FOR USE  Sit on the edge of your bed if  possible, or sit up as far as you can in bed or on a chair. Hold the incentive spirometer in an upright position. Breathe out normally. Place the mouthpiece in your mouth and seal your lips tightly around it. Breathe in slowly and as deeply as possible, raising the piston or the ball toward the top of the column. Hold your breath for 3-5 seconds or for as long as possible. Allow the piston or ball to fall to the bottom of the column. Remove the mouthpiece from your mouth and breathe out normally. Rest for a few seconds and repeat Steps 1 through 7 at least 10 times every 1-2 hours when you are awake. Take your time and take a few normal breaths between deep breaths. The spirometer may include an indicator to show your best effort. Use the indicator as a goal to work toward during each repetition. After each set of 10 deep breaths, practice coughing to be sure your lungs are clear. If you have an incision (the cut made at the time of surgery), support your incision when coughing by placing a pillow or rolled up towels firmly against it. Once you are able to get out of bed, walk around indoors and cough well. You may stop using the incentive spirometer when instructed by your caregiver.  RISKS AND COMPLICATIONS Take your time so you do not get dizzy or light-headed. If you are in pain, you may need to take or ask  for pain medication before doing incentive spirometry. It is harder to take a deep breath if you are having pain. AFTER USE Rest and breathe slowly and easily. It can be helpful to keep track of a log of your progress. Your caregiver can provide you with a simple table to help with this. If you are using the spirometer at home, follow these instructions: [place] IF:  You are having difficultly using the spirometer. You have trouble using the spirometer as often as instructed. Your pain medication is not giving enough relief while using the spirometer. You develop fever of  100.5 F (38.1 C) or higher. SEEK IMMEDIATE MEDICAL CARE IF:  You cough up bloody sputum that had not been present before. You develop fever of 102 F (38.9 C) or greater. You develop worsening pain at or near the incision site. MAKE SURE YOU:  Understand these instructions. Will watch your condition. Will get help right away if you are not doing well or get worse. Document Released: 05/03/2006 Document Revised: 03/15/2011 Document Reviewed: 07/04/2006 [facility] Patient Information 2014 [place], [place].   ________________________________________________________________________

## 2020-10-15 ENCOUNTER — Encounter (HOSPITAL_COMMUNITY)
Admission: RE | Admit: 2020-10-15 | Discharge: 2020-10-15 | Disposition: A | Discharge status: Home / Self Care | Payer: PPO | Source: Ambulatory Visit | Attending: Orthopaedic Surgery | Admitting: Orthopaedic Surgery

## 2020-10-15 ENCOUNTER — Encounter (HOSPITAL_COMMUNITY): Payer: Self-pay

## 2020-10-15 ENCOUNTER — Other Ambulatory Visit: Payer: Self-pay

## 2020-10-15 DIAGNOSIS — Z01818 Encounter for other preprocedural examination: Secondary | ICD-10-CM | POA: Insufficient documentation

## 2020-10-15 DIAGNOSIS — E119 Type 2 diabetes mellitus without complications: Secondary | ICD-10-CM | POA: Diagnosis not present

## 2020-10-15 HISTORY — DX: Other specified postprocedural states: Z98.890

## 2020-10-15 HISTORY — DX: Nausea with vomiting, unspecified: R11.2

## 2020-10-15 LAB — SURGICAL PCR SCREEN
MRSA, PCR: NEGATIVE
Staphylococcus aureus: NEGATIVE

## 2020-10-15 LAB — CBC WITH DIFFERENTIAL/PLATELET
Abs Immature Granulocytes: 0.01 10*3/uL (ref 0.00–0.07)
Basophils Absolute: 0.1 10*3/uL (ref 0.0–0.1)
Basophils Relative: 1 %
Eosinophils Absolute: 0.1 10*3/uL (ref 0.0–0.5)
Eosinophils Relative: 1 %
HCT: 45.8 % (ref 36.0–46.0)
Hemoglobin: 15.3 g/dL — ABNORMAL HIGH (ref 12.0–15.0)
Immature Granulocytes: 0 %
Lymphocytes Relative: 28 %
Lymphs Abs: 1.7 10*3/uL (ref 0.7–4.0)
MCH: 33.6 pg (ref 26.0–34.0)
MCHC: 33.4 g/dL (ref 30.0–36.0)
MCV: 100.7 fL — ABNORMAL HIGH (ref 80.0–100.0)
Monocytes Absolute: 0.5 10*3/uL (ref 0.1–1.0)
Monocytes Relative: 9 %
Neutro Abs: 3.7 10*3/uL (ref 1.7–7.7)
Neutrophils Relative %: 61 %
Platelets: 187 10*3/uL (ref 150–400)
RBC: 4.55 MIL/uL (ref 3.87–5.11)
RDW: 13.2 % (ref 11.5–15.5)
WBC: 6 10*3/uL (ref 4.0–10.5)
nRBC: 0 % (ref 0.0–0.2)

## 2020-10-15 LAB — TYPE AND SCREEN
ABO/RH(D): B POS
Antibody Screen: NEGATIVE

## 2020-10-15 LAB — PROTIME-INR
INR: 1 (ref 0.8–1.2)
Prothrombin Time: 13 seconds (ref 11.4–15.2)

## 2020-10-15 LAB — URINALYSIS, ROUTINE W REFLEX MICROSCOPIC
Bilirubin Urine: NEGATIVE
Glucose, UA: NEGATIVE mg/dL
Ketones, ur: NEGATIVE mg/dL
Leukocytes,Ua: NEGATIVE
Nitrite: NEGATIVE
Protein, ur: NEGATIVE mg/dL
Specific Gravity, Urine: 1.008 (ref 1.005–1.030)
pH: 7 (ref 5.0–8.0)

## 2020-10-15 LAB — COMPREHENSIVE METABOLIC PANEL
ALT: 20 U/L (ref 0–44)
AST: 23 U/L (ref 15–41)
Albumin: 4.4 g/dL (ref 3.5–5.0)
Alkaline Phosphatase: 43 U/L (ref 38–126)
Anion gap: 8 (ref 5–15)
BUN: 18 mg/dL (ref 8–23)
CO2: 31 mmol/L (ref 22–32)
Calcium: 9.8 mg/dL (ref 8.9–10.3)
Chloride: 100 mmol/L (ref 98–111)
Creatinine, Ser: 0.59 mg/dL (ref 0.44–1.00)
GFR, Estimated: >60 mL/min (ref >60)
Glucose, Bld: 75 mg/dL (ref 70–99)
Potassium: 4.2 mmol/L (ref 3.5–5.1)
Sodium: 139 mmol/L (ref 135–145)
Total Bilirubin: 0.6 mg/dL (ref 0.3–1.2)
Total Protein: 7.6 g/dL (ref 6.5–8.1)

## 2020-10-15 LAB — APTT: aPTT: 32 seconds (ref 24–36)

## 2020-10-15 NOTE — Progress Notes (Addendum)
PCP - Clearance Tanya [name] 10-07-20 epic Cardiologist - Jon Billings   seen  in 2016 was stress related no longer sees him  PPM/ICD -  Device Orders -  Rep Notified -   Chest x-ray - Low dose CT scan 08-29-20 epic EKG - 10-07-20 on chart and under media tab Stress Test - 2016 ECHO - 2016 Cardiac Cath -   Sleep Study -  CPAP -   Fasting Blood Sugar -  Checks Blood Sugar _____ times a day  Blood Thinner Instructions: Aspirin Instructions:81 mg     ERAS Protcol - PRE-SURGERY Ensure   COVID TEST- N/A COVID vaccine - fully vaccinated pfizer  Activity-- Able to walk a flight of stairs without SOB Anesthesia review: COPD  Patient denies shortness of breath, fever, cough and chest pain at PAT appointment   All instructions explained to the patient, with a verbal understanding of the material. Patient agrees to go over the instructions while at home for a better understanding. Patient also instructed to self quarantine after being tested for COVID-19. The opportunity to ask questions was provided.

## 2020-10-20 NOTE — Care Plan (Signed)
Ortho Bundle Case Management Note  Patient Details  Name: Tanya [name] MRN: [id number] Date of Birth: 08/28/1950     Spoke with patient prior to surgery. She will discharge to home with sister to assist. She has  rolling walker. HHPT referral to [facility]. OPPT will be arranged if needed. Patient and MD in agreement with plan. Choice offered                 DME Arranged:    DME Agency:     HH Arranged:  PT HH Agency:  [facility]  Additional Comments: Please contact me with any questions of if this plan should need to change.  Ladell Heads,  [place] [facility]  [phone] 10/20/2020, 11:33 AM

## 2020-10-22 NOTE — H&P (Signed)
TOTAL HIP ADMISSION H&P  Patient is admitted for left total hip arthroplasty.  Subjective:  Chief Complaint: left hip pain  HPI: Tanya [name], 70 y.o. female, has a history of pain and functional disability in the left hip(s) due to arthritis and patient has failed non-surgical conservative treatments for greater than 12 weeks to include NSAID's and/or analgesics, corticosteriod injections, flexibility and strengthening excercises, use of assistive devices, weight reduction as appropriate, and activity modification.  Onset of symptoms was gradual starting 5 years ago with gradually worsening course since that time.The patient noted no past surgery on the left hip(s).  Patient currently rates pain in the left hip at 10 out of 10 with activity. Patient has night pain, worsening of pain with activity and weight bearing, trendelenberg gait, pain that interfers with activities of daily living, and crepitus. Patient has evidence of subchondral cysts, subchondral sclerosis, periarticular osteophytes, and joint space narrowing by imaging studies. This condition presents safety issues increasing the risk of falls. There is no current active infection.  Patient Active Problem List   Diagnosis Date Noted   Aortic atherosclerosis ([place]) 07/16/2019   Osteoporosis 04/26/2016   Malignant neoplasm of upper-outer quadrant of left breast in female, estrogen receptor positive ([place]) 11/11/2014   Genetic testing 10/23/2014   Family history of breast cancer    Precordial chest pain 05/28/2014   Well adult exam 04/03/2014   Midsternal chest pain 04/03/2014   Left hip pain 04/03/2014   Ovarian cyst, right 06/22/2013   Hyperlipidemia 06/22/2013   SHINGLES 02/23/2010   TOBACCO ABUSE 11/04/2008   COPD (chronic obstructive pulmonary disease) ([place]) 11/04/2008   Osteopenia 11/04/2008   Past Medical History:  Diagnosis Date   Arthritis    left knee and hip   Breast cancer ([place])    Radiation and lumpectomy   mastectomy and chemo   COPD, mild ([place])    Family history of breast cancer    Family history of uterine cancer    Hyperlipidemia    Osteopenia    DEXA 10/27/11: -1.4, no change from 10/2009   PONV (postoperative nausea and vomiting)    Severe   Shingles    Smoker     Past Surgical History:  Procedure Laterality Date   BREAST BIOPSY     BREAST LUMPECTOMY     COLONOSCOPY     DILATION AND EVACUATION     MASTECTOMY Left 08/2014   TONSILLECTOMY      No current facility-administered medications for this encounter.   Current Outpatient Medications  Medication Sig Dispense Refill Last Dose   aspirin EC 81 MG tablet Take 81 mg by mouth in the morning. Swallow whole.      Calcium Carbonate-Vitamin D 600-400 MG-UNIT tablet Take 1 tablet by mouth in the morning and at bedtime.      carboxymethylcellulose (REFRESH PLUS) 0.5 % SOLN Place 1 drop into both eyes 3 (three) times daily as needed (tired/dry/irritated eyes.).      cholecalciferol (VITAMIN D3) 25 MCG (1000 UNIT) tablet Take 1,000 Units by mouth in the morning and at bedtime.      ibuprofen (ADVIL) 200 MG tablet Take 400 mg by mouth every 8 (eight) hours as needed (pain). 30 tablet 0    Misc Natural Products (OSTEO BI-FLEX TRIPLE STRENGTH) TABS Take 1 tablet by mouth 2 (two) times daily.      Multiple Vitamin (MULTIVITAMIN WITH MINERALS) TABS tablet Take 1 tablet by mouth daily with breakfast.      simvastatin (  ZOCOR) 10 MG tablet TAKE 1 TABLET BY MOUTH EVERYDAY AT BEDTIME 90 tablet 3    nicotine (NICODERM CQ - DOSED IN MG/24 HOURS) 14 mg/24hr patch Place 1 patch (14 mg total) onto the skin daily. 28 patch 0    Allergies  Allergen Reactions   Tetanus Toxoid Other (See Comments)    shock   Codeine Rash    Social History   Tobacco Use   Smoking status: Every Day    Packs/day: 1.00    Years: 49.00    Pack years: 49.00    Types: Cigarettes   Smokeless tobacco: Never   Tobacco comments:    Trying to smoke less.   Substance  Use Topics   Alcohol use: Yes    Alcohol/week: 2.0 standard drinks    Types: 1 Glasses of wine, 1 Shots of liquor per week    Comment: daily    Family History  Problem Relation Age of Onset   CAD Mother        stent older age   Hypertension Mother    Breast cancer Mother 66   Cancer Mother        SKIN and BLADDER    Heart disease Paternal Grandfather    Lung cancer Paternal Grandfather    Hypertension Father    Heart disease Father 49       CHF    Dementia Father    Hearing loss Maternal Grandfather    Parkinson's disease Paternal Grandmother    Colon cancer Neg Hx    Esophageal cancer Neg Hx    Stomach cancer Neg Hx    Rectal cancer Neg Hx      Review of Systems  Musculoskeletal:  Positive for arthralgias.       Left Hip  All other systems reviewed and are negative.  Objective:  Physical Exam Constitutional:      Appearance: Normal appearance.  HENT:     Head: Normocephalic and atraumatic.     Nose: Nose normal.     Mouth/Throat:     Pharynx: Oropharynx is clear.  Eyes:     Extraocular Movements: Extraocular movements intact.  Cardiovascular:     Rate and Rhythm: Normal rate.  Pulmonary:     Effort: Pulmonary effort is normal.  Abdominal:     Palpations: Abdomen is soft.  Musculoskeletal:     Cervical back: Normal range of motion.     Comments: Left hip motion is a little limited in terms of rotation compared to the opposite side.  She has some extreme pain on internal rotation.  Leg lengths are roughly equal.  Sensation and motor function are intact distally.  She has palpable pulses in her feet.   Skin:    General: Skin is warm and dry.  Neurological:     General: No focal deficit present.     Mental Status: She is alert and oriented to person, place, and time.  Psychiatric:        Mood and Affect: Mood normal.        Behavior: Behavior normal.        Thought Content: Thought content normal.        Judgment: Judgment normal.    Vital signs in last  24 hours:    Labs:   Estimated body mass index is 21.46 kg/m as calculated from the following:   Height as of 10/15/20: 5' 4.25" (1.632 m).   Weight as of 10/15/20: 57.2 kg.   Imaging Review Plain  radiographs demonstrate severe degenerative joint disease of the left hip(s). The bone quality appears to be good for age and reported activity level.      Assessment/Plan:  End stage primary arthritis, left hip(s)  The patient history, physical examination, clinical judgement of the provider and imaging studies are consistent with end stage degenerative joint disease of the left hip(s) and total hip arthroplasty is deemed medically necessary. The treatment options including medical management, injection therapy, arthroscopy and arthroplasty were discussed at length. The risks and benefits of total hip arthroplasty were presented and reviewed. The risks due to aseptic loosening, infection, stiffness, dislocation/subluxation,  thromboembolic complications and other imponderables were discussed.  The patient acknowledged the explanation, agreed to proceed with the plan and consent was signed. Patient is being admitted for inpatient treatment for surgery, pain control, PT, OT, prophylactic antibiotics, VTE prophylaxis, progressive ambulation and ADL's and discharge planning.The patient is planning to be discharged home with home health services

## 2020-10-27 NOTE — Anesthesia Preprocedure Evaluation (Addendum)
Anesthesia Evaluation  Patient identified by MRN, date of birth, ID band Patient awake    Reviewed: Allergy & Precautions, NPO status , Patient's Chart, lab work & pertinent test results  History of Anesthesia Complications (+) PONV  Airway Mallampati: II  TM Distance: >3 FB Neck ROM: Full    Dental  (+) Teeth Intact   Pulmonary COPD, Current Smoker,    Pulmonary exam normal        Cardiovascular negative cardio ROS   Rhythm:Regular Rate:Normal     Neuro/Psych negative neurological ROS  negative psych ROS   GI/Hepatic negative GI ROS, Neg liver ROS,   Endo/Other  negative endocrine ROS  Renal/GU negative Renal ROS  negative genitourinary   Musculoskeletal  (+) Arthritis , Osteoarthritis,    Abdominal (+)  Abdomen: soft.    Peds  Hematology negative hematology ROS (+)   Anesthesia Other Findings   Reproductive/Obstetrics                            Anesthesia Physical Anesthesia Plan  ASA: 2  Anesthesia Plan: MAC and Spinal   Post-op Pain Management:    Induction: Intravenous  PONV Risk Score and Plan: 3 and Ondansetron, Dexamethasone, Aprepitant, Midazolam, Propofol infusion and Treatment may vary due to age or medical condition  Airway Management Planned: Simple Face Mask, Natural Airway and Nasal Cannula  Additional Equipment: None  Intra-op Plan:   Post-operative Plan:   Informed Consent: I have reviewed the patients History and Physical, chart, labs and discussed the procedure including the risks, benefits and alternatives for the proposed anesthesia with the patient or authorized representative who has indicated his/her understanding and acceptance.     Dental advisory given  Plan Discussed with: CRNA  Anesthesia Plan Comments: (Lab Results      Component                Value               Date                      WBC                      6.0                  10/15/2020                HGB                      15.3 (H)            10/15/2020                HCT                      45.8                10/15/2020                MCV                      100.7 (H)           10/15/2020                PLT  187                 10/15/2020           Lab Results      Component                Value               Date                      NA                       139                 10/15/2020                K                        4.2                 10/15/2020                CO2                      31                  10/15/2020                GLUCOSE                  75                  10/15/2020                BUN                      18                  10/15/2020                CREATININE               0.59                10/15/2020                CALCIUM                  9.8                 10/15/2020                EGFR                     >60                 11/08/2016                GFRNONAA                 >60                 10/15/2020          )       Anesthesia Quick Evaluation

## 2020-10-28 ENCOUNTER — Ambulatory Visit (HOSPITAL_COMMUNITY): Payer: PPO | Admitting: Physician Assistant

## 2020-10-28 ENCOUNTER — Encounter (HOSPITAL_COMMUNITY): Payer: Self-pay | Admitting: Orthopaedic Surgery

## 2020-10-28 ENCOUNTER — Ambulatory Visit (HOSPITAL_COMMUNITY): Payer: PPO

## 2020-10-28 ENCOUNTER — Ambulatory Visit (HOSPITAL_COMMUNITY): Payer: PPO | Admitting: Anesthesiology

## 2020-10-28 ENCOUNTER — Encounter (HOSPITAL_COMMUNITY)
Admission: RE | Disposition: A | Discharge status: Home / Self Care | Payer: Self-pay | Source: Home / Self Care | Attending: Orthopaedic Surgery

## 2020-10-28 ENCOUNTER — Ambulatory Visit (HOSPITAL_COMMUNITY)
Admission: RE | Admit: 2020-10-28 | Discharge: 2020-10-28 | Disposition: A | Discharge status: Home / Self Care | Payer: PPO | Attending: Orthopaedic Surgery | Admitting: Orthopaedic Surgery

## 2020-10-28 DIAGNOSIS — Z471 Aftercare following joint replacement surgery: Secondary | ICD-10-CM | POA: Diagnosis not present

## 2020-10-28 DIAGNOSIS — Z419 Encounter for procedure for purposes other than remedying health state, unspecified: Secondary | ICD-10-CM

## 2020-10-28 DIAGNOSIS — M1612 Unilateral primary osteoarthritis, left hip: Secondary | ICD-10-CM | POA: Insufficient documentation

## 2020-10-28 DIAGNOSIS — M24852 Other specific joint derangements of left hip, not elsewhere classified: Secondary | ICD-10-CM | POA: Diagnosis not present

## 2020-10-28 DIAGNOSIS — M25752 Osteophyte, left hip: Secondary | ICD-10-CM | POA: Insufficient documentation

## 2020-10-28 DIAGNOSIS — Z96642 Presence of left artificial hip joint: Secondary | ICD-10-CM | POA: Diagnosis not present

## 2020-10-28 DIAGNOSIS — F17210 Nicotine dependence, cigarettes, uncomplicated: Secondary | ICD-10-CM | POA: Diagnosis not present

## 2020-10-28 DIAGNOSIS — I700 Atherosclerosis of aorta: Secondary | ICD-10-CM | POA: Diagnosis not present

## 2020-10-28 DIAGNOSIS — E785 Hyperlipidemia, unspecified: Secondary | ICD-10-CM | POA: Diagnosis not present

## 2020-10-28 DIAGNOSIS — M8568 Other cyst of bone, other site: Secondary | ICD-10-CM | POA: Diagnosis not present

## 2020-10-28 DIAGNOSIS — R2689 Other abnormalities of gait and mobility: Secondary | ICD-10-CM | POA: Insufficient documentation

## 2020-10-28 DIAGNOSIS — J449 Chronic obstructive pulmonary disease, unspecified: Secondary | ICD-10-CM | POA: Diagnosis not present

## 2020-10-28 HISTORY — PX: TOTAL HIP ARTHROPLASTY: SHX124

## 2020-10-28 LAB — ABO/RH: ABO/RH(D): B POS

## 2020-10-28 SURGERY — ARTHROPLASTY, HIP, TOTAL, ANTERIOR APPROACH
Anesthesia: Monitor Anesthesia Care | Site: Hip | Laterality: Left

## 2020-10-28 MED ORDER — LACTATED RINGERS IV BOLUS: 500.0000 mL | Freq: Once | INTRAVENOUS | Status: DC

## 2020-10-28 MED ORDER — LACTATED RINGERS IV SOLN: INTRAVENOUS | Status: DC

## 2020-10-28 MED ORDER — DEXMEDETOMIDINE (PRECEDEX) IN NS 20 MCG/5ML (4 MCG/ML) IV SYRINGE
PREFILLED_SYRINGE | INTRAVENOUS | Status: DC | PRN
  Administered 2020-10-28 (×2): 4 ug via INTRAVENOUS

## 2020-10-28 MED ORDER — CEFAZOLIN SODIUM-DEXTROSE 2-4 GM/100ML-% IV SOLN
2.0000 g | INTRAVENOUS | Status: AC
  Administered 2020-10-28: 2 g via INTRAVENOUS
  Filled 2020-10-28: qty 100

## 2020-10-28 MED ORDER — PROPOFOL 500 MG/50ML IV EMUL
INTRAVENOUS | Status: DC | PRN
  Administered 2020-10-28: 75 ug/kg/min via INTRAVENOUS

## 2020-10-28 MED ORDER — TRANEXAMIC ACID 1000 MG/10ML IV SOLN: 2000.0000 mg | Freq: Once | INTRAVENOUS | Status: DC

## 2020-10-28 MED ORDER — PHENYLEPHRINE HCL (PRESSORS) 10 MG/ML IV SOLN
INTRAVENOUS | Status: AC
  Filled 2020-10-28: qty 1

## 2020-10-28 MED ORDER — KETOROLAC TROMETHAMINE 15 MG/ML IJ SOLN
7.5000 mg | Freq: Four times a day (QID) | INTRAMUSCULAR | Status: DC
  Administered 2020-10-28: 7.5 mg via INTRAVENOUS

## 2020-10-28 MED ORDER — DEXAMETHASONE SODIUM PHOSPHATE 10 MG/ML IJ SOLN
INTRAMUSCULAR | Status: DC | PRN
  Administered 2020-10-28: 10 mg via INTRAVENOUS

## 2020-10-28 MED ORDER — ONDANSETRON HCL 4 MG/2ML IJ SOLN
INTRAMUSCULAR | Status: DC | PRN
  Administered 2020-10-28: 4 mg via INTRAVENOUS

## 2020-10-28 MED ORDER — BUPIVACAINE-EPINEPHRINE (PF) 0.25% -1:200000 IJ SOLN
INTRAMUSCULAR | Status: DC | PRN
  Administered 2020-10-28: 30 mL

## 2020-10-28 MED ORDER — FENTANYL CITRATE (PF) 100 MCG/2ML IJ SOLN
INTRAMUSCULAR | Status: AC
  Filled 2020-10-28: qty 2

## 2020-10-28 MED ORDER — BUPIVACAINE-EPINEPHRINE (PF) 0.25% -1:200000 IJ SOLN
INTRAMUSCULAR | Status: AC
  Filled 2020-10-28: qty 30

## 2020-10-28 MED ORDER — PHENYLEPHRINE HCL-NACL 20-0.9 MG/250ML-% IV SOLN
INTRAVENOUS | Status: DC | PRN
  Administered 2020-10-28: 25 ug/min via INTRAVENOUS

## 2020-10-28 MED ORDER — PHENYLEPHRINE 40 MCG/ML (10ML) SYRINGE FOR IV PUSH (FOR BLOOD PRESSURE SUPPORT)
PREFILLED_SYRINGE | INTRAVENOUS | Status: AC
  Filled 2020-10-28: qty 10

## 2020-10-28 MED ORDER — LIDOCAINE HCL (PF) 2 % IJ SOLN
INTRAMUSCULAR | Status: AC
  Filled 2020-10-28: qty 10

## 2020-10-28 MED ORDER — CEFAZOLIN SODIUM-DEXTROSE 2-4 GM/100ML-% IV SOLN: 2.0000 g | Freq: Four times a day (QID) | INTRAVENOUS | Status: DC

## 2020-10-28 MED ORDER — LACTATED RINGERS IV BOLUS
250.0000 mL | Freq: Once | INTRAVENOUS | Status: AC
  Administered 2020-10-28: 250 mL via INTRAVENOUS

## 2020-10-28 MED ORDER — DEXMEDETOMIDINE (PRECEDEX) IN NS 20 MCG/5ML (4 MCG/ML) IV SYRINGE
PREFILLED_SYRINGE | INTRAVENOUS | Status: AC
  Filled 2020-10-28: qty 5

## 2020-10-28 MED ORDER — PHENYLEPHRINE 40 MCG/ML (10ML) SYRINGE FOR IV PUSH (FOR BLOOD PRESSURE SUPPORT)
PREFILLED_SYRINGE | INTRAVENOUS | Status: DC | PRN
  Administered 2020-10-28 (×4): 80 ug via INTRAVENOUS

## 2020-10-28 MED ORDER — LIDOCAINE 2% (20 MG/ML) 5 ML SYRINGE
INTRAMUSCULAR | Status: DC | PRN
  Administered 2020-10-28: 50 mg via INTRAVENOUS

## 2020-10-28 MED ORDER — TRANEXAMIC ACID-NACL 1000-0.7 MG/100ML-% IV SOLN
1000.0000 mg | INTRAVENOUS | Status: AC
  Administered 2020-10-28: 1000 mg via INTRAVENOUS
  Filled 2020-10-28: qty 100

## 2020-10-28 MED ORDER — FENTANYL CITRATE PF 50 MCG/ML IJ SOSY: 25.0000 ug | PREFILLED_SYRINGE | INTRAMUSCULAR | Status: DC | PRN

## 2020-10-28 MED ORDER — BUPIVACAINE LIPOSOME 1.3 % IJ SUSP
INTRAMUSCULAR | Status: DC | PRN
  Administered 2020-10-28: 10 mL

## 2020-10-28 MED ORDER — PROPOFOL 1000 MG/100ML IV EMUL
INTRAVENOUS | Status: AC
  Filled 2020-10-28: qty 100

## 2020-10-28 MED ORDER — BUPIVACAINE LIPOSOME 1.3 % IJ SUSP
INTRAMUSCULAR | Status: AC
  Filled 2020-10-28: qty 10

## 2020-10-28 MED ORDER — AMISULPRIDE (ANTIEMETIC) 5 MG/2ML IV SOLN: 10.0000 mg | Freq: Once | INTRAVENOUS | Status: DC | PRN

## 2020-10-28 MED ORDER — HYDROCODONE-ACETAMINOPHEN 5-325 MG PO TABS
ORAL_TABLET | ORAL | Status: AC
  Filled 2020-10-28: qty 1

## 2020-10-28 MED ORDER — ACETAMINOPHEN 10 MG/ML IV SOLN: 1000.0000 mg | Freq: Once | INTRAVENOUS | Status: DC | PRN

## 2020-10-28 MED ORDER — MIDAZOLAM HCL 2 MG/2ML IJ SOLN
INTRAMUSCULAR | Status: AC
  Filled 2020-10-28: qty 2

## 2020-10-28 MED ORDER — TRANEXAMIC ACID 1000 MG/10ML IV SOLN
2000.0000 mg | INTRAVENOUS | Status: DC
  Filled 2020-10-28: qty 20

## 2020-10-28 MED ORDER — KETOROLAC TROMETHAMINE 30 MG/ML IJ SOLN
INTRAMUSCULAR | Status: AC
  Filled 2020-10-28: qty 1

## 2020-10-28 MED ORDER — HYDROCODONE-ACETAMINOPHEN 5-325 MG PO TABS: 1.0000 | ORAL_TABLET | Freq: Four times a day (QID) | ORAL | 0 refills | Status: DC | PRN

## 2020-10-28 MED ORDER — ASPIRIN EC 81 MG PO TBEC
81.0000 mg | DELAYED_RELEASE_TABLET | Freq: Two times a day (BID) | ORAL | 0 refills | Status: DC
Start: 2020-10-28 — End: 2022-02-08

## 2020-10-28 MED ORDER — TIZANIDINE HCL 4 MG PO TABS: 4.0000 mg | ORAL_TABLET | Freq: Four times a day (QID) | ORAL | 1 refills | Status: DC | PRN

## 2020-10-28 MED ORDER — BUPIVACAINE LIPOSOME 1.3 % IJ SUSP: 10.0000 mL | Freq: Once | INTRAMUSCULAR | Status: DC

## 2020-10-28 MED ORDER — STERILE WATER FOR IRRIGATION IR SOLN
Status: DC | PRN
  Administered 2020-10-28: 2000 mL

## 2020-10-28 MED ORDER — METHOCARBAMOL 500 MG IVPB - SIMPLE MED: 500.0000 mg | Freq: Four times a day (QID) | INTRAVENOUS | Status: DC | PRN

## 2020-10-28 MED ORDER — DEXAMETHASONE SODIUM PHOSPHATE 10 MG/ML IJ SOLN
INTRAMUSCULAR | Status: AC
  Filled 2020-10-28: qty 4

## 2020-10-28 MED ORDER — TRANEXAMIC ACID 1000 MG/10ML IV SOLN
INTRAVENOUS | Status: DC | PRN
  Administered 2020-10-28: 2000 mg via TOPICAL

## 2020-10-28 MED ORDER — EPHEDRINE 5 MG/ML INJ
INTRAVENOUS | Status: AC
  Filled 2020-10-28: qty 5

## 2020-10-28 MED ORDER — CHLORHEXIDINE GLUCONATE 0.12 % MT SOLN
15.0000 mL | Freq: Once | OROMUCOSAL | Status: AC
  Administered 2020-10-28: 15 mL via OROMUCOSAL

## 2020-10-28 MED ORDER — TRANEXAMIC ACID-NACL 1000-0.7 MG/100ML-% IV SOLN: 1000.0000 mg | Freq: Once | INTRAVENOUS | Status: AC

## 2020-10-28 MED ORDER — MIDAZOLAM HCL 2 MG/2ML IJ SOLN
INTRAMUSCULAR | Status: DC | PRN
  Administered 2020-10-28: 2 mg via INTRAVENOUS

## 2020-10-28 MED ORDER — POVIDONE-IODINE 10 % EX SWAB
2.0000 | Freq: Once | CUTANEOUS | Status: AC
Start: 2020-10-28 — End: 2020-10-28
  Administered 2020-10-28: 2 via TOPICAL

## 2020-10-28 MED ORDER — ORAL CARE MOUTH RINSE: 15.0000 mL | Freq: Once | OROMUCOSAL | Status: AC

## 2020-10-28 MED ORDER — TRANEXAMIC ACID-NACL 1000-0.7 MG/100ML-% IV SOLN
INTRAVENOUS | Status: AC
  Administered 2020-10-28: 1000 mg via INTRAVENOUS
  Filled 2020-10-28: qty 100

## 2020-10-28 MED ORDER — BUPIVACAINE IN DEXTROSE 0.75-8.25 % IT SOLN
INTRATHECAL | Status: DC | PRN
  Administered 2020-10-28: 1.6 mL via INTRATHECAL

## 2020-10-28 MED ORDER — METHOCARBAMOL 500 MG PO TABS: 500.0000 mg | ORAL_TABLET | Freq: Four times a day (QID) | ORAL | Status: DC | PRN

## 2020-10-28 MED ORDER — HYDROCODONE-ACETAMINOPHEN 5-325 MG PO TABS
1.0000 | ORAL_TABLET | ORAL | Status: DC | PRN
  Administered 2020-10-28: 1 via ORAL

## 2020-10-28 MED ORDER — 0.9 % SODIUM CHLORIDE (POUR BTL) OPTIME
TOPICAL | Status: DC | PRN
  Administered 2020-10-28: 1000 mL

## 2020-10-28 MED ORDER — ONDANSETRON HCL 4 MG/2ML IJ SOLN
INTRAMUSCULAR | Status: AC
  Filled 2020-10-28: qty 8

## 2020-10-28 MED ORDER — APREPITANT 40 MG PO CAPS
40.0000 mg | ORAL_CAPSULE | Freq: Once | ORAL | Status: AC
  Administered 2020-10-28: 40 mg via ORAL
  Filled 2020-10-28: qty 1

## 2020-10-28 SURGICAL SUPPLY — 48 items
BAG COUNTER SPONGE SURGICOUNT (BAG) ×1 IMPLANT
BAG DECANTER FOR FLEXI CONT (MISCELLANEOUS) ×2 IMPLANT
BAG SPNG CNTER NS LX DISP (BAG) ×1
BLADE SAW SGTL 18X1.27X75 (BLADE) ×2 IMPLANT
BOOTIES KNEE HIGH SLOAN (MISCELLANEOUS) ×2 IMPLANT
CELLS DAT CNTRL 66122 CELL SVR (MISCELLANEOUS) ×1 IMPLANT
COVER PERINEAL POST (MISCELLANEOUS) ×2 IMPLANT
COVER SURGICAL LIGHT HANDLE (MISCELLANEOUS) ×2 IMPLANT
CUP ACETABULAR GRIPTON 100 52 (Orthopedic Implant) IMPLANT
DECANTER SPIKE VIAL GLASS SM (MISCELLANEOUS) ×2 IMPLANT
DRAPE FOOT SWITCH (DRAPES) ×2 IMPLANT
DRAPE IMP U-DRAPE 54X76 (DRAPES) ×2 IMPLANT
DRAPE ORTHO SPLIT 77X108 STRL (DRAPES)
DRAPE STERI IOBAN 125X83 (DRAPES) ×2 IMPLANT
DRAPE SURG ORHT 6 SPLT 77X108 (DRAPES) IMPLANT
DRAPE U-SHAPE 47X51 STRL (DRAPES) ×4 IMPLANT
DRSG AQUACEL AG ADV 3.5X 6 (GAUZE/BANDAGES/DRESSINGS) ×2 IMPLANT
DURAPREP 26ML APPLICATOR (WOUND CARE) ×2 IMPLANT
ELECT BLADE TIP CTD 4 INCH (ELECTRODE) ×1 IMPLANT
ELECT REM PT RETURN 15FT ADLT (MISCELLANEOUS) ×2 IMPLANT
ELIMINATOR HOLE APEX DEPUY (Hips) ×1 IMPLANT
GLOVE SRG 8 PF TXTR STRL LF DI (GLOVE) ×2 IMPLANT
GLOVE SURG ENC MOIS LTX SZ8 (GLOVE) ×4 IMPLANT
GLOVE SURG UNDER POLY LF SZ8 (GLOVE) ×4
GOWN STRL REUS W/TWL XL LVL3 (GOWN DISPOSABLE) ×4 IMPLANT
GRIPTON 100 52 (Orthopedic Implant) ×2 IMPLANT
HEAD CERAMIC 36 PLUS5 (Hips) ×1 IMPLANT
HOLDER FOLEY CATH W/STRAP (MISCELLANEOUS) ×2 IMPLANT
KIT TURNOVER KIT A (KITS) IMPLANT
LINER ACETAB NEUTRAL 36ID 520D (Liner) ×1 IMPLANT
MANIFOLD NEPTUNE II (INSTRUMENTS) ×2 IMPLANT
NEEDLE HYPO 22GX1.5 SAFETY (NEEDLE) ×2 IMPLANT
NS IRRIG 1000ML POUR BTL (IV SOLUTION) ×1 IMPLANT
PACK ANTERIOR HIP CUSTOM (KITS) ×2 IMPLANT
PENCIL SMOKE EVACUATOR (MISCELLANEOUS) IMPLANT
PROTECTOR NERVE ULNAR (MISCELLANEOUS) ×1 IMPLANT
RETRACTOR WND ALEXIS 18 MED (MISCELLANEOUS) ×1 IMPLANT
RTRCTR WOUND ALEXIS 18CM MED (MISCELLANEOUS) ×2
SPONGE T-LAP 18X18 ~~LOC~~+RFID (SPONGE) ×2 IMPLANT
STEM FEMORAL SZ 5MM STD ACTIS (Stem) ×1 IMPLANT
SUT ETHIBOND NAB CT1 #1 30IN (SUTURE) ×4 IMPLANT
SUT VIC AB 1 CT1 36 (SUTURE) ×2 IMPLANT
SUT VIC AB 2-0 CT1 27 (SUTURE) ×2
SUT VIC AB 2-0 CT1 TAPERPNT 27 (SUTURE) ×1 IMPLANT
SUT VICRYL AB 3-0 FS1 BRD 27IN (SUTURE) ×2 IMPLANT
SUT VLOC 180 0 24IN GS25 (SUTURE) ×2 IMPLANT
SYR 50ML LL SCALE MARK (SYRINGE) ×2 IMPLANT
TRAY FOLEY MTR SLVR 14FR STAT (SET/KITS/TRAYS/PACK) ×1 IMPLANT

## 2020-10-28 NOTE — Anesthesia Procedure Notes (Signed)
Spinal  Patient location during procedure: OR Start time: 10/28/2020 7:44 AM End time: 10/28/2020 7:45 AM Staffing Performed: anesthesiologist  Anesthesiologist: Darral Dash, DO Preanesthetic Checklist Completed: patient identified, IV checked, site marked, risks and benefits discussed, surgical consent, monitors and equipment checked, pre-op evaluation and timeout performed Spinal Block Patient position: sitting Prep: DuraPrep Patient monitoring: heart rate, cardiac monitor, continuous pulse ox and blood pressure Approach: midline Location: L3-4 Injection technique: single-shot Needle Needle type: Pencan  Needle gauge: 24 G Needle length: 10 cm Assessment Events: CSF return Additional Notes Patient identified. Risks/Benefits/Options discussed with patient including but not limited to bleeding, infection, nerve damage, paralysis, failed block, incomplete pain control, headache, blood pressure changes, nausea, vomiting, reactions to medications, itching and postpartum back pain. Confirmed with bedside nurse the patient's most recent platelet count. Confirmed with patient that they are not currently taking any anticoagulation, have any bleeding history or any family history of bleeding disorders. Patient expressed understanding and wished to proceed. All questions were answered. Sterile technique was used throughout the entire procedure. Please see nursing notes for vital signs. Warning signs of high block given to the patient including shortness of breath, tingling/numbness in hands, complete motor block, or any concerning symptoms with instructions to call for help. Patient was given instructions on fall risk and not to get out of bed. All questions and concerns addressed with instructions to call with any issues or inadequate analgesia.

## 2020-10-28 NOTE — Anesthesia Postprocedure Evaluation (Signed)
Anesthesia Post Note  Patient: Tanya [name]  Procedure(s) Performed: LEFT TOTAL HIP ARTHROPLASTY ANTERIOR APPROACH (Left: Hip)     Patient location during evaluation: PACU Anesthesia Type: MAC and Spinal Level of consciousness: awake and alert Pain management: pain level controlled Vital Signs Assessment: post-procedure vital signs reviewed and stable Respiratory status: spontaneous breathing, nonlabored ventilation, respiratory function stable and patient connected to nasal cannula oxygen Cardiovascular status: stable and blood pressure returned to baseline Postop Assessment: no apparent nausea or vomiting Anesthetic complications: no   No notable events documented.  Last Vitals:  Vitals:   10/28/20 1030 10/28/20 1042  BP: 106/62 122/71  Pulse: 66   Resp: 15 16  Temp: 36.6 C 36.6 C  SpO2: 97%     Last Pain:  Vitals:   10/28/20 1042  TempSrc:   PainSc: 1                  Kedar Sedano P Raheen Capili

## 2020-10-28 NOTE — Transfer of Care (Signed)
Immediate Anesthesia Transfer of Care Note  Patient: Tanya [name]  Procedure(s) Performed: Procedure(s): LEFT TOTAL HIP ARTHROPLASTY ANTERIOR APPROACH (Left)  Patient Location: PACU  Anesthesia Type:Spinal  Level of Consciousness: awake, alert  and oriented  Airway & Oxygen Therapy: Patient Spontanous Breathing  Post-op Assessment: Report given to RN and Post -op Vital signs reviewed and stable  Post vital signs: Reviewed and stable  Last Vitals:  Vitals:   10/28/20 0637  BP: 121/68  Pulse: 87  Resp: 18  Temp: 36.8 C  SpO2: 43%    Complications: No apparent anesthesia complications

## 2020-10-28 NOTE — Evaluation (Signed)
Physical Therapy Evaluation Patient Details Name: Tanya [name] MRN: [id number] DOB: [birth year] Today's Date: 10/28/2020  History of Present Illness   Patient is 70 y.o. female s/p Lt THA anterior approach on 10/28/20 with PMH significant for OA, breast cancer, COPD, osteopenia, HLD.    Clinical Impression  Tanya [name] is a 70 y.o. female POD 0 s/p Lt THA. Patient reports independence with mobility at baseline. Patient is now limited by functional impairments (see PT problem list below) and requires min guard/supervision for transfers and gait with RW. Patient was able to ambulate ~100 feet with RW and min guard/supervision and cues for safe walker management. Patient educated on safe sequencing for stair mobility and verbalized safe guarding position for people assisting with mobility. Patient's sister present and provided safe guarding throughout with cues/supervision from therapist. Patient instructed in exercises to facilitate ROM and circulation. Patient will benefit from continued skilled PT interventions to address impairments and progress towards PLOF. Patient has met mobility goals at adequate level for discharge home; will continue to follow if pt continues acute stay to progress towards Mod I goals.        Recommendations for follow up therapy are one component of a multi-disciplinary discharge planning process, led by the attending physician.  Recommendations may be updated based on patient status, additional functional criteria and insurance authorization.  Follow Up Recommendations  HHPT    Assistance Recommended at Discharge  Intermittent Supervision/Assistance  Functional Status Assessment  Patient has had a recent decline in their functional status and demonstrates the ability to make significant improvements in function in a reasonable and predictable amount of time.   Equipment Recommendations   None    Recommendations for Other Services  Fall     Precautions /  Restrictions  No WBAT        10/28/20 1200  PT Visit Information  Last PT Received On 10/28/20  Assistance Needed +1  History of Present Illness Patient is 70 y.o. female s/p Lt THA anterior approach on 10/28/20 with PMH significant for OA, breast cancer, COPD, osteopenia, HLD.  Precautions  Precautions Fall  Restrictions  Weight Bearing Restrictions No  Other Position/Activity Restrictions WBAT  Home Living  Family/patient expects to be discharged to: Private residence  Living Arrangements Alone  Available Help at Discharge Family  Type of [place] to enter  Entrance Stairs-Number of Steps 4  Entrance Stairs-Rails Right  [place] One level  Bathroom Shower/Tub Walk-in shower  [place] (2 wheels);Cane - single point;Grab bars - tub/shower;Shower seat - built in;BSC;Grab bars - toilet  Prior Function  Prior Level of Function  Independent/Modified Independent  Communication  Communication No difficulties  Pain Assessment  Pain Assessment 0-10  Pain Score 3  Pain Location Lt hip  Pain Descriptors / Indicators Aching;Discomfort;Tightness (stiff)  Pain Intervention(s) Limited activity within patient's tolerance;Monitored during session;Repositioned  Cognition  Arousal/Alertness Awake/alert  Behavior During Therapy WFL for tasks assessed/performed  Overall Cognitive Status Within Functional Limits for tasks assessed  Upper Extremity Assessment  Upper Extremity Assessment Overall WFL for tasks assessed  Lower Extremity Assessment  Lower Extremity Assessment Overall WFL for tasks assessed  Cervical / Trunk Assessment  Cervical / Trunk Assessment Normal  Bed Mobility  Overal bed mobility Needs Assistance  Bed Mobility Supine to Sit;Sit to Supine  Supine to sit Supervision;HOB elevated  Sit to supine Supervision;Min guard;HOB elevated  General bed  mobility  comments supervision for safety, pt taking extra time to sit up and cues to use belt to assist Lt LE on/off EOB.  Transfers  Overall transfer level Needs assistance  Equipment used Rolling walker (2 wheels)  Transfers Sit to/from Stand  Sit to Stand Min guard;Supervision  Ambulation/Gait  Ambulation/Gait assistance Min guard;Supervision  Gait Distance (Feet) 100 Feet  Assistive device Rolling walker (2 wheels)  Gait Pattern/deviations Step-to pattern;Decreased stride length  General Gait Details cues for safe step to pattern and proximity to RW, no overt LOB throughout. pt's sister provided safe guarding with supervision from therapsit.  Gait velocity decr  Balance  Overall balance assessment Needs assistance  Sitting-balance support Feet supported  Sitting balance-Leahy Scale Good  Standing balance support Bilateral upper extremity supported;During functional activity;Reliant on assistive device for balance  Standing balance-Leahy Scale Poor  Exercises  Exercises Total Joint  Total Joint Exercises  Ankle Circles/Pumps AROM;Both;Supine;15 reps  Quad Sets AROM;Left;5 reps;Supine  Short Arc Quad AROM;Left;5 reps;Supine  Heel Slides AROM;Left;5 reps;Supine  Hip ABduction/ADduction AROM;Left;5 reps;Supine  PT - End of Session  Equipment Utilized During Treatment Gait belt  Activity Tolerance Patient tolerated treatment well  Patient left in bed;with call bell/phone within reach;with family/visitor present  Nurse Communication Mobility status  PT Assessment  PT Recommendation/Assessment Patient needs continued PT services  PT Visit Diagnosis Muscle weakness (generalized) (M62.81);Difficulty in walking, not elsewhere classified (R26.2)  PT Problem List Decreased strength;Decreased range of motion;Decreased activity tolerance;Decreased balance;Decreased mobility;Decreased knowledge of use of DME;Decreased knowledge of precautions;Pain  PT Plan  PT Frequency (ACUTE ONLY) 7X/week  PT  Treatment/Interventions (ACUTE ONLY) DME instruction;Gait training;Stair training;Functional mobility training;Therapeutic activities;Therapeutic exercise;Balance training;Patient/family education  AM-PAC PT "6 Clicks" Mobility Outcome Measure (Version 2)  Help needed turning from your back to your side while in a flat bed without using bedrails? 4  Help needed moving from lying on your back to sitting on the side of a flat bed without using bedrails? 3  Help needed moving to and from a bed to a chair (including a wheelchair)? 3  Help needed standing up from a chair using your arms (e.g., wheelchair or bedside chair)? 3  Help needed to walk in hospital room? 3  Help needed climbing 3-5 steps with a railing?  3  6 Click Score 19  Consider Recommendation of Discharge To: Home with [facility]  Progressive Mobility  What is the highest level of mobility based on the progressive mobility assessment? Level 5 (Walks with assist in room/hall) - Balance while stepping forward/back and can walk in room with assist - Complete  Mobility Ambulated with assistance in hallway  PT Recommendation  Follow Up Recommendations Home health PT  Assistance recommended at discharge Intermittent Supervision/Assistance  Functional Status Assessment Patient has had a recent decline in their functional status and demonstrates the ability to make significant improvements in function in a reasonable and predictable amount of time.  PT equipment None recommended by PT  Individuals Consulted  Consulted and Agree with Results and Recommendations Patient  Acute Rehab PT Goals  Patient Stated Goal get home safely today  PT Goal Formulation With patient  Time For Goal Achievement 11/04/20  Potential to Achieve Goals Good  PT Time Calculation  PT Start Time (ACUTE ONLY) 1245  PT Stop Time (ACUTE ONLY) 1331  PT Time Calculation (min) (ACUTE ONLY) 46 min  PT General Charges  $$ ACUTE PT VISIT 1 Visit  PT Evaluation  $PT Eval Low  Complexity 1 Low  PT Treatments  $Gait Training 8-22 mins  $Therapeutic Exercise 8-22 mins  Written Expression  Dominant Hand Right    Verner Mould, DPT Acute Rehabilitation Services Office [phone] Pager [phone]   Jacques Navy 10/28/2020, 4:23 PM

## 2020-10-28 NOTE — Interval H&P Note (Signed)
History and Physical Interval Note:  10/28/2020 7:30 AM  Tanya [name]  has presented today for surgery, with the diagnosis of left hip degenerative joint disease.  The various methods of treatment have been discussed with the patient and family. After consideration of risks, benefits and other options for treatment, the patient has consented to  Procedure(s): LEFT TOTAL HIP ARTHROPLASTY ANTERIOR APPROACH (Left) as a surgical intervention.  The patient's history has been reviewed, patient examined, no change in status, stable for surgery.  I have reviewed the patient's chart and labs.  Questions were answered to the patient's satisfaction.     Hessie Dibble

## 2020-10-28 NOTE — Op Note (Signed)
PRE-OP DIAGNOSIS:  LEFT HIP DEGENERATIVE JOINT DISEASE POST-OP DIAGNOSIS: same PROCEDURE:  LEFT TOTAL HIP ARTHROPLASTY ANTERIOR APPROACH ANESTHESIA:  Spinal and MAC SURGEON:  [name] MD ASSISTANT:  Loni Dolly PA-C   INDICATIONS FOR PROCEDURE:  The patient is a 70 y.o. female with a long history of a painful hip.  This has persisted despite multiple conservative measures.  The patient has persisted with pain and dysfunction making rest and activity difficult.  A total hip replacement is offered as surgical treatment.  Informed operative consent was obtained after discussion of possible complications including reaction to anesthesia, infection, neurovascular injury, dislocation, DVT, PE, and death.  The importance of the postoperative rehab program to optimize result was stressed with the patient.  SUMMARY OF FINDINGS AND PROCEDURE:  Under the above anesthesia through a anterior approach an the Hana table a left THR was performed.  The patient had severe degenerative change and good bone quality.  We used DePuy components to replace the hip and these were size 5 Actis femur capped with a +5 81m ceramic hip ball.  On the acetabular side we used a size 52 Gription shell with a plus 0 neutral polyethylene liner.  We did use a hole eliminator.  ALoni DollyPA-C assisted throughout and was invaluable to the completion of the case in that he helped position and retract while I performed the procedure.  He also closed simultaneously to help minimize OR time.  I used fluoroscopy throughout the case to check position of components and leg lengths and read all these views myself.  DESCRIPTION OF PROCEDURE:  The patient was taken to the OR suite where the above anesthetic was applied.  The patient was then positioned on the Hana table supine.  All bony prominences were appropriately padded.  Prep and drape was then performed in normal sterile fashion.  The patient was given kefzol preoperative antibiotic and  an appropriate time out was performed.  We then took an anterior approach to the left hip.  Dissection was taken through adipose to the tensor fascia lata fascia.  This structure was incised longitudinally and we dissected in the intermuscular interval just medial to this muscle.  Cobra retractors were placed superior and inferior to the femoral neck superficial to the capsule.  A capsular incision was then made and the retractors were placed along the femoral neck.  Xray was brought in to get a good level for the femoral neck cut which was made with an oscillating saw and osteotome.  The femoral head was removed with a corkscrew.  The acetabulum was exposed and some labral tissues were excised. Reaming was taken to the inside wall of the pelvis and sequentially up to 1 mm smaller than the actual component.  A trial of components was done and then the aforementioned acetabular shell was placed in appropriate tilt and anteversion confirmed by fluoroscopy. The liner was placed along with the hole eliminator and attention was turned to the femur.  The leg was brought down and over into adduction and the elevator bar was used to raise the femur up gently in the wound.  The piriformis was released with care taken to preserve the obturator internus attachment and all of the posterior capsule. The femur was reamed and then broached to the appropriate size.  A trial reduction was done and the aforementioned head and neck assembly gave uKoreathe best stability in extension with external rotation.  Leg lengths were felt to be about equal by fluoroscopic  exam.  The trial components were removed and the wound irrigated.  We then placed the femoral component in appropriate anteversion.  The head was applied to a dry stem neck and the hip again reduced.  It was again stable in the aforementioned position.  The would was irrigated again followed by re-approximation of anterior capsule with ethibond suture. Tensor fascia was repaired  with V-loc suture  followed by deep closure with #O and #2 undyed vicryl.  Skin was closed with subQ stitch and steristrips followed by a sterile dressing.  EBL and IOF can be obtained from anesthesia records.  DISPOSITION:  The patient was extubated in the OR and taken to PACU in stable condition to be admitted to the Orthopedic Surgery for appropriate post-op care to include perioperative antibiotics and DVT prophylaxis.

## 2020-10-30 DIAGNOSIS — M810 Age-related osteoporosis without current pathological fracture: Secondary | ICD-10-CM | POA: Diagnosis not present

## 2020-10-30 DIAGNOSIS — Z471 Aftercare following joint replacement surgery: Secondary | ICD-10-CM | POA: Diagnosis not present

## 2020-10-30 DIAGNOSIS — Z853 Personal history of malignant neoplasm of breast: Secondary | ICD-10-CM | POA: Diagnosis not present

## 2020-10-30 DIAGNOSIS — N83201 Unspecified ovarian cyst, right side: Secondary | ICD-10-CM | POA: Diagnosis not present

## 2020-10-30 DIAGNOSIS — M1712 Unilateral primary osteoarthritis, left knee: Secondary | ICD-10-CM | POA: Diagnosis not present

## 2020-10-30 DIAGNOSIS — I700 Atherosclerosis of aorta: Secondary | ICD-10-CM | POA: Diagnosis not present

## 2020-10-30 DIAGNOSIS — F17210 Nicotine dependence, cigarettes, uncomplicated: Secondary | ICD-10-CM | POA: Diagnosis not present

## 2020-10-30 DIAGNOSIS — Z7982 Long term (current) use of aspirin: Secondary | ICD-10-CM | POA: Diagnosis not present

## 2020-10-30 DIAGNOSIS — J449 Chronic obstructive pulmonary disease, unspecified: Secondary | ICD-10-CM | POA: Diagnosis not present

## 2020-10-30 DIAGNOSIS — E785 Hyperlipidemia, unspecified: Secondary | ICD-10-CM | POA: Diagnosis not present

## 2020-10-30 DIAGNOSIS — Z96642 Presence of left artificial hip joint: Secondary | ICD-10-CM | POA: Diagnosis not present

## 2020-10-30 DIAGNOSIS — Z9012 Acquired absence of left breast and nipple: Secondary | ICD-10-CM | POA: Diagnosis not present

## 2020-11-04 ENCOUNTER — Encounter (HOSPITAL_COMMUNITY): Payer: Self-pay | Admitting: Orthopaedic Surgery

## 2020-11-07 DIAGNOSIS — Z9889 Other specified postprocedural states: Secondary | ICD-10-CM | POA: Diagnosis not present

## 2020-11-07 DIAGNOSIS — M1612 Unilateral primary osteoarthritis, left hip: Secondary | ICD-10-CM | POA: Diagnosis not present

## 2021-01-12 DIAGNOSIS — M25652 Stiffness of left hip, not elsewhere classified: Secondary | ICD-10-CM | POA: Diagnosis not present

## 2021-01-12 DIAGNOSIS — Z96642 Presence of left artificial hip joint: Secondary | ICD-10-CM | POA: Diagnosis not present

## 2021-01-12 DIAGNOSIS — R531 Weakness: Secondary | ICD-10-CM | POA: Diagnosis not present

## 2021-01-14 DIAGNOSIS — R531 Weakness: Secondary | ICD-10-CM | POA: Diagnosis not present

## 2021-01-14 DIAGNOSIS — Z96642 Presence of left artificial hip joint: Secondary | ICD-10-CM | POA: Diagnosis not present

## 2021-01-14 DIAGNOSIS — M25652 Stiffness of left hip, not elsewhere classified: Secondary | ICD-10-CM | POA: Diagnosis not present

## 2021-01-21 DIAGNOSIS — M25652 Stiffness of left hip, not elsewhere classified: Secondary | ICD-10-CM | POA: Diagnosis not present

## 2021-01-21 DIAGNOSIS — R531 Weakness: Secondary | ICD-10-CM | POA: Diagnosis not present

## 2021-01-21 DIAGNOSIS — Z96642 Presence of left artificial hip joint: Secondary | ICD-10-CM | POA: Diagnosis not present

## 2021-01-23 DIAGNOSIS — R531 Weakness: Secondary | ICD-10-CM | POA: Diagnosis not present

## 2021-01-23 DIAGNOSIS — M25652 Stiffness of left hip, not elsewhere classified: Secondary | ICD-10-CM | POA: Diagnosis not present

## 2021-01-23 DIAGNOSIS — Z96642 Presence of left artificial hip joint: Secondary | ICD-10-CM | POA: Diagnosis not present

## 2021-01-27 DIAGNOSIS — R531 Weakness: Secondary | ICD-10-CM | POA: Diagnosis not present

## 2021-01-27 DIAGNOSIS — M25652 Stiffness of left hip, not elsewhere classified: Secondary | ICD-10-CM | POA: Diagnosis not present

## 2021-01-27 DIAGNOSIS — Z96642 Presence of left artificial hip joint: Secondary | ICD-10-CM | POA: Diagnosis not present

## 2021-01-29 DIAGNOSIS — M25652 Stiffness of left hip, not elsewhere classified: Secondary | ICD-10-CM | POA: Diagnosis not present

## 2021-01-29 DIAGNOSIS — R531 Weakness: Secondary | ICD-10-CM | POA: Diagnosis not present

## 2021-01-29 DIAGNOSIS — Z96642 Presence of left artificial hip joint: Secondary | ICD-10-CM | POA: Diagnosis not present

## 2021-02-03 DIAGNOSIS — M25652 Stiffness of left hip, not elsewhere classified: Secondary | ICD-10-CM | POA: Diagnosis not present

## 2021-02-03 DIAGNOSIS — R531 Weakness: Secondary | ICD-10-CM | POA: Diagnosis not present

## 2021-02-03 DIAGNOSIS — Z96642 Presence of left artificial hip joint: Secondary | ICD-10-CM | POA: Diagnosis not present

## 2021-02-05 DIAGNOSIS — M25652 Stiffness of left hip, not elsewhere classified: Secondary | ICD-10-CM | POA: Diagnosis not present

## 2021-02-05 DIAGNOSIS — R531 Weakness: Secondary | ICD-10-CM | POA: Diagnosis not present

## 2021-02-05 DIAGNOSIS — Z96642 Presence of left artificial hip joint: Secondary | ICD-10-CM | POA: Diagnosis not present

## 2021-02-06 DIAGNOSIS — M1612 Unilateral primary osteoarthritis, left hip: Secondary | ICD-10-CM | POA: Diagnosis not present

## 2021-02-06 DIAGNOSIS — Z9889 Other specified postprocedural states: Secondary | ICD-10-CM | POA: Diagnosis not present

## 2021-02-09 ENCOUNTER — Other Ambulatory Visit: Payer: Self-pay | Admitting: Obstetrics & Gynecology

## 2021-02-09 DIAGNOSIS — Z1231 Encounter for screening mammogram for malignant neoplasm of breast: Secondary | ICD-10-CM

## 2021-02-10 DIAGNOSIS — Z96642 Presence of left artificial hip joint: Secondary | ICD-10-CM | POA: Diagnosis not present

## 2021-02-10 DIAGNOSIS — R531 Weakness: Secondary | ICD-10-CM | POA: Diagnosis not present

## 2021-02-10 DIAGNOSIS — M25652 Stiffness of left hip, not elsewhere classified: Secondary | ICD-10-CM | POA: Diagnosis not present

## 2021-02-12 DIAGNOSIS — M25652 Stiffness of left hip, not elsewhere classified: Secondary | ICD-10-CM | POA: Diagnosis not present

## 2021-02-12 DIAGNOSIS — Z96642 Presence of left artificial hip joint: Secondary | ICD-10-CM | POA: Diagnosis not present

## 2021-02-12 DIAGNOSIS — R531 Weakness: Secondary | ICD-10-CM | POA: Diagnosis not present

## 2021-02-17 DIAGNOSIS — Z96642 Presence of left artificial hip joint: Secondary | ICD-10-CM | POA: Diagnosis not present

## 2021-02-17 DIAGNOSIS — M25652 Stiffness of left hip, not elsewhere classified: Secondary | ICD-10-CM | POA: Diagnosis not present

## 2021-02-17 DIAGNOSIS — R531 Weakness: Secondary | ICD-10-CM | POA: Diagnosis not present

## 2021-02-19 DIAGNOSIS — M25652 Stiffness of left hip, not elsewhere classified: Secondary | ICD-10-CM | POA: Diagnosis not present

## 2021-02-19 DIAGNOSIS — Z96642 Presence of left artificial hip joint: Secondary | ICD-10-CM | POA: Diagnosis not present

## 2021-02-19 DIAGNOSIS — R531 Weakness: Secondary | ICD-10-CM | POA: Diagnosis not present

## 2021-02-20 ENCOUNTER — Encounter: Payer: Self-pay | Admitting: *Deleted

## 2021-02-23 ENCOUNTER — Encounter: Payer: Self-pay | Admitting: Internal Medicine

## 2021-02-23 ENCOUNTER — Ambulatory Visit: Payer: PPO | Admitting: Internal Medicine

## 2021-02-23 VITALS — BP 120/72 | HR 82 | Ht 65.0 in | Wt 130.0 lb

## 2021-02-23 DIAGNOSIS — R1011 Right upper quadrant pain: Secondary | ICD-10-CM

## 2021-02-23 NOTE — Patient Instructions (Signed)
If you are age 69 or older, your body mass index should be between 23-30. Your Body mass index is 21.63 kg/m. If this is out of the aforementioned range listed, please consider follow up with your Primary Care Provider. ________________________________________________________  The [facility] providers would like to encourage you to use [facility] to communicate with providers for non-urgent requests or questions.  Due to long hold times on the telephone, sending your provider a message by [facility] may be a faster and more efficient way to get a response.  Please allow 48 business hours for a response.  Please remember that this is for non-urgent requests.  _______________________________________________________  Tanya [name] have been scheduled for a right upper quadrant abdominal ultrasound at [facility] (1st floor of hospital) on 03/02/2021 at 8:30 am. Please arrive 15 minutes prior to your appointment for registration. Make certain not to have anything to eat or drink 6 hours prior to your appointment. Should you need to reschedule your appointment, please contact radiology at [phone]. This test typically takes about 30 minutes to perform.  We will contact your with the results of your ultrasound and discuss any follow up at that time.

## 2021-02-23 NOTE — Progress Notes (Signed)
Patient ID: Tanya [name], female   DOB: 1950/10/16, 71 y.o.   MRN: [id number] HPI: Tanya [name] is a 71 year old female with a GI history notable for mild diverticulosis, and a medical history notable for history of breast cancer, hyperlipidemia, mild COPD and osteopenia who is here to evaluate right upper quadrant abdominal pain.  She is here alone today.  She is known to me from screening colonoscopy which I performed on 12/16/2017.  This was normal with the exception of sigmoid diverticulosis and small internal hemorrhoids.  She reports that many years ago she saw Dr. Collene Mares and was told that she needed her gallbladder removed.  She recalls having a HIDA scan performed.  I was able to find this in October 2004.  Result below.  At that time she was having intermittent right upper quadrant abdominal pain which was crampy in nature and worse after higher fat meals.  So for many years she has avoided high-fat foods and symptoms have been rare.  However, of late since having her left hip replaced in October 2022 she has noticed crampy right upper quadrant abdominal pain triggered only by bending over at an acute angle.  This induces sharp pain which she has to massage to relieve.  He does not relate any longer to eating.  Outside of this trigger her right upper quadrant pain does not hurt.  Separate from this and just recently she had an episode of epigastric pain which was sharp occurring near bedtime after eating pot roast and gravy.  This was somewhat similar to pain years ago that was possibly attributed to gallbladder.  This type pain has been quite rare for her of late.  She denies heartburn and GERD symptoms.  No dysphagia or odynophagia.  No nausea or vomiting.  Bowel movements are regular without blood or melena.  Past Medical History:  Diagnosis Date   Arthritis    left knee and hip   Breast cancer ([place])    Radiation and lumpectomy  mastectomy and chemo   COPD, mild ([place])     Diverticulosis    Family history of breast cancer    Family history of uterine cancer    Hyperlipidemia    Internal hemorrhoids    Osteopenia    DEXA 10/27/11: -1.4, no change from 10/2009   PONV (postoperative nausea and vomiting)    Severe   Shingles    Smoker     Past Surgical History:  Procedure Laterality Date   BREAST BIOPSY     BREAST LUMPECTOMY     COLONOSCOPY     DILATION AND EVACUATION     MASTECTOMY Left 08/2014   TONSILLECTOMY     TOTAL HIP ARTHROPLASTY Left 10/28/2020   Procedure: LEFT TOTAL HIP ARTHROPLASTY ANTERIOR APPROACH;  Surgeon: [name], MD;  Location: [facility];  Service: Orthopedics;  Laterality: Left;    Outpatient Medications Prior to Visit  Medication Sig Dispense Refill   aspirin EC 81 MG tablet Take 1 tablet (81 mg total) by mouth 2 (two) times daily after a meal. Swallow whole. (Patient taking differently: Take 81 mg by mouth once. Swallow whole.) 45 tablet 0   Calcium Carbonate-Vitamin D 600-400 MG-UNIT tablet Take 1 tablet by mouth in the morning and at bedtime.     carboxymethylcellulose (REFRESH PLUS) 0.5 % SOLN Place 1 drop into both eyes 3 (three) times daily as needed (tired/dry/irritated eyes.).     cholecalciferol (VITAMIN D3) 25 MCG (1000 UNIT) tablet Take 1,000 Units by mouth  in the morning and at bedtime.     Misc Natural Products (OSTEO BI-FLEX TRIPLE STRENGTH) TABS Take 1 tablet by mouth 2 (two) times daily.     Multiple Vitamin (MULTIVITAMIN WITH MINERALS) TABS tablet Take 1 tablet by mouth daily with breakfast.     simvastatin (ZOCOR) 10 MG tablet TAKE 1 TABLET BY MOUTH EVERYDAY AT BEDTIME 90 tablet 3   HYDROcodone-acetaminophen (NORCO/VICODIN) 5-325 MG tablet Take 1-2 tablets by mouth every 6 (six) hours as needed for moderate pain or severe pain (post op pain). 40 tablet 0   nicotine (NICODERM CQ - DOSED IN MG/24 HOURS) 14 mg/24hr patch Place 1 patch (14 mg total) onto the skin daily. 28 patch 0   tiZANidine (ZANAFLEX) 4 MG  tablet Take 1 tablet (4 mg total) by mouth every 6 (six) hours as needed for muscle spasms. 40 tablet 1   No facility-administered medications prior to visit.    Allergies  Allergen Reactions   Tetanus Toxoid Other (See Comments)    shock   Codeine Rash    Family History  Problem Relation Age of Onset   CAD Mother        stent older age   Hypertension Mother    Breast cancer Mother 39   Cancer Mother        SKIN and BLADDER    Hypertension Father    Heart disease Father 16       CHF    Dementia Father    Heart disease Brother    Hearing loss Maternal Grandfather    Parkinson's disease Paternal Grandmother    Heart disease Paternal Grandfather    Lung cancer Paternal Grandfather    Colon cancer Neg Hx    Esophageal cancer Neg Hx    Stomach cancer Neg Hx    Rectal cancer Neg Hx     Social History   Tobacco Use   Smoking status: Every Day    Packs/day: 1.00    Years: 49.00    Pack years: 49.00    Types: Cigarettes   Smokeless tobacco: Never   Tobacco comments:    Trying to smoke less.   Vaping Use   Vaping Use: Never used  Substance Use Topics   Alcohol use: Yes    Alcohol/week: 2.0 standard drinks    Types: 1 Glasses of wine, 1 Shots of liquor per week    Comment: daily   Drug use: No    ROS: As per history of present illness, otherwise negative  BP 120/72    Pulse 82    Ht 5\' 5"  (1.651 m)    Wt 130 lb (59 kg)    SpO2 97%    BMI 21.63 kg/m  Gen: awake, alert, NAD HEENT: anicteric  CV: RRR, no mrg Pulm: CTA b/l Abd: soft, NT/ND, +BS throughout, no hepatosplenomegaly Ext: no c/c/e Neuro: nonfocal  RELEVANT LABS AND IMAGING: CBC    Component Value Date/Time   WBC 6.0 10/15/2020 1100   RBC 4.55 10/15/2020 1100   HGB 15.3 (H) 10/15/2020 1100   HGB 15.1 (H) 11/22/2019 0951   HGB 15.5 11/08/2016 1131   HCT 45.8 10/15/2020 1100   HCT 45.4 11/08/2016 1131   PLT 187 10/15/2020 1100   PLT 188 11/22/2019 0951   PLT 176 11/08/2016 1131   MCV 100.7  (H) 10/15/2020 1100   MCV 99.1 11/08/2016 1131   MCH 33.6 10/15/2020 1100   MCHC 33.4 10/15/2020 1100   RDW 13.2 10/15/2020 1100  RDW 13.2 11/08/2016 1131   LYMPHSABS 1.7 10/15/2020 1100   LYMPHSABS 1.7 11/08/2016 1131   MONOABS 0.5 10/15/2020 1100   MONOABS 0.5 11/08/2016 1131   EOSABS 0.1 10/15/2020 1100   EOSABS 0.0 11/08/2016 1131   BASOSABS 0.1 10/15/2020 1100   BASOSABS 0.0 11/08/2016 1131    CMP     Component Value Date/Time   NA 139 10/15/2020 1100   NA 138 11/08/2016 1131   K 4.2 10/15/2020 1100   K 4.7 11/08/2016 1131   CL 100 10/15/2020 1100   CO2 31 10/15/2020 1100   CO2 31 (H) 11/08/2016 1131   GLUCOSE 75 10/15/2020 1100   GLUCOSE 82 11/08/2016 1131   BUN 18 10/15/2020 1100   BUN 15.1 11/08/2016 1131   CREATININE 0.59 10/15/2020 1100   CREATININE 0.77 11/22/2019 0951   CREATININE 0.8 11/08/2016 1131   CALCIUM 9.8 10/15/2020 1100   CALCIUM 10.0 11/08/2016 1131   PROT 7.6 10/15/2020 1100   PROT 7.4 11/08/2016 1131   ALBUMIN 4.4 10/15/2020 1100   ALBUMIN 4.3 11/08/2016 1131   AST 23 10/15/2020 1100   AST 20 11/22/2019 0951   AST 20 11/08/2016 1131   ALT 20 10/15/2020 1100   ALT 16 11/22/2019 0951   ALT 14 11/08/2016 1131   ALKPHOS 43 10/15/2020 1100   ALKPHOS 48 11/08/2016 1131   BILITOT 0.6 10/15/2020 1100   BILITOT 0.5 11/22/2019 0951   BILITOT 0.52 11/08/2016 1131   GFRNONAA >60 10/15/2020 1100   GFRNONAA >60 11/22/2019 0951   GFRAA >60 11/22/2018 0931    IMAGING: October 2004 ULTRASOUND OF ABDOMEN COMPLETE:  THE GALLBLADDER IS WELL VISUALIZED WITHOUT EVIDENCE OF STONES.  THE GALLBLADDER WALL IS NORMAL.  THE COMMON BILE DUCT IS NORMAL MEASURING 2 TO 3 MM.  THE LIVER, PANCREAS AND SPLEEN ARE  UNREMARKABLE.  THE RIGHT KIDNEY MEASURES 10.8 CM AND THE LEFT KIDNEY 11.6 CM.  THE AORTA IS NORMAL  MEASURING UP TO 2 CM.  IMPRESSION  NORMAL.  NM HEPATOBILIARY SCAN WITH EJECTION FRACTION:  THE STUDY WAS PERFORMED AFTER IV INJECTION OF 5.1 mCi Tc73m  CHOLETEC.  THERE IS PROMPT  VISUALIZATION OF THE LIVER, BILIARY DUCTAL SYSTEM AND GALLBLADDER.  WHEN THE GALLBLADDER IS  OPTIMALLY FILLED AT 60 MINUTES, THE PATIENT IS GIVEN 8 OZ OF HALF AND HALF.  AT 60 MINUTES, 46% HAS  BEEN EJECTED FROM THE GALLBLADDER.  IMPRESSION  GALLBLADDER PROMPTLY  FILLS.  GALLBLADDER EJECTION FRACTION IS 46% AT ONE HOUR WHICH IS IN THE LOW NORMAL RANGE.   ASSESSMENT/PLAN: 71 year old female with a GI history notable for mild diverticulosis, and a medical history notable for history of breast cancer, hyperlipidemia, mild COPD and osteopenia who is here to evaluate right upper quadrant abdominal pain.  RUQ pain --her right upper quadrant pain induced by bending over is unlikely to be related to gallbladder pain.  We discussed this today.  This is more likely related to muscle wall pain or spasm induced by bending over and likely splinting away from left hip.  She did have a low normal gallbladder EF 20 years ago but she does not have classic biliary colic symptoms at this time.  We discussed this today.  She did have a CMP which was normal within the last 4 months.  Her pain in the right upper quadrant which occurs with fatty food may represent intermittent biliary dyskinesia.  We discussed this today and I am not recommending surgical consultation for cholecystectomy at this time.  I  did recommend the following: --Right upper quadrant ultrasound; evaluate gallbladder wall, rule out gallstones and evaluate Murphy sign  2.  CRC screening --repeat colonoscopy recommended December 2029    WN:IOEVOJ, Malka So, [place] [address] [place],  [place] [address]

## 2021-02-25 DIAGNOSIS — R531 Weakness: Secondary | ICD-10-CM | POA: Diagnosis not present

## 2021-02-25 DIAGNOSIS — Z96642 Presence of left artificial hip joint: Secondary | ICD-10-CM | POA: Diagnosis not present

## 2021-02-25 DIAGNOSIS — M25652 Stiffness of left hip, not elsewhere classified: Secondary | ICD-10-CM | POA: Diagnosis not present

## 2021-03-02 ENCOUNTER — Other Ambulatory Visit: Payer: Self-pay

## 2021-03-02 ENCOUNTER — Ambulatory Visit (HOSPITAL_COMMUNITY)
Admission: RE | Admit: 2021-03-02 | Discharge: 2021-03-02 | Disposition: A | Discharge status: Home / Self Care | Payer: PPO | Source: Ambulatory Visit | Attending: Internal Medicine | Admitting: Internal Medicine

## 2021-03-02 DIAGNOSIS — R1011 Right upper quadrant pain: Secondary | ICD-10-CM | POA: Insufficient documentation

## 2021-03-05 DIAGNOSIS — D225 Melanocytic nevi of trunk: Secondary | ICD-10-CM | POA: Diagnosis not present

## 2021-03-05 DIAGNOSIS — L821 Other seborrheic keratosis: Secondary | ICD-10-CM | POA: Diagnosis not present

## 2021-03-05 DIAGNOSIS — D2261 Melanocytic nevi of right upper limb, including shoulder: Secondary | ICD-10-CM | POA: Diagnosis not present

## 2021-03-05 DIAGNOSIS — L814 Other melanin hyperpigmentation: Secondary | ICD-10-CM | POA: Diagnosis not present

## 2021-03-05 DIAGNOSIS — Z23 Encounter for immunization: Secondary | ICD-10-CM | POA: Diagnosis not present

## 2021-03-05 DIAGNOSIS — Z86018 Personal history of other benign neoplasm: Secondary | ICD-10-CM | POA: Diagnosis not present

## 2021-03-05 DIAGNOSIS — L578 Other skin changes due to chronic exposure to nonionizing radiation: Secondary | ICD-10-CM | POA: Diagnosis not present

## 2021-03-11 DIAGNOSIS — M1612 Unilateral primary osteoarthritis, left hip: Secondary | ICD-10-CM | POA: Diagnosis not present

## 2021-03-11 DIAGNOSIS — Z96642 Presence of left artificial hip joint: Secondary | ICD-10-CM | POA: Diagnosis not present

## 2021-03-26 ENCOUNTER — Ambulatory Visit: Payer: PPO

## 2021-03-27 ENCOUNTER — Ambulatory Visit
Admission: RE | Admit: 2021-03-27 | Discharge: 2021-03-27 | Disposition: A | Discharge status: Home / Self Care | Payer: PPO | Source: Ambulatory Visit | Attending: Obstetrics & Gynecology | Admitting: Obstetrics & Gynecology

## 2021-03-27 DIAGNOSIS — Z1231 Encounter for screening mammogram for malignant neoplasm of breast: Secondary | ICD-10-CM
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
## 2021-07-20 ENCOUNTER — Ambulatory Visit (INDEPENDENT_AMBULATORY_CARE_PROVIDER_SITE_OTHER): Payer: PPO | Admitting: Family Medicine

## 2021-07-20 ENCOUNTER — Encounter: Payer: Self-pay | Admitting: Family Medicine

## 2021-07-20 VITALS — BP 120/70 | HR 78 | Temp 98.5°F | Resp 16 | Ht 65.0 in | Wt 129.2 lb

## 2021-07-20 DIAGNOSIS — Z170 Estrogen receptor positive status [ER+]: Secondary | ICD-10-CM

## 2021-07-20 DIAGNOSIS — G8929 Other chronic pain: Secondary | ICD-10-CM | POA: Diagnosis not present

## 2021-07-20 DIAGNOSIS — Z0000 Encounter for general adult medical examination without abnormal findings: Secondary | ICD-10-CM | POA: Diagnosis not present

## 2021-07-20 DIAGNOSIS — I700 Atherosclerosis of aorta: Secondary | ICD-10-CM | POA: Diagnosis not present

## 2021-07-20 DIAGNOSIS — C50412 Malignant neoplasm of upper-outer quadrant of left female breast: Secondary | ICD-10-CM

## 2021-07-20 DIAGNOSIS — R1011 Right upper quadrant pain: Secondary | ICD-10-CM | POA: Diagnosis not present

## 2021-07-20 DIAGNOSIS — E785 Hyperlipidemia, unspecified: Secondary | ICD-10-CM | POA: Diagnosis not present

## 2021-07-20 DIAGNOSIS — J449 Chronic obstructive pulmonary disease, unspecified: Secondary | ICD-10-CM | POA: Diagnosis not present

## 2021-07-20 DIAGNOSIS — R351 Nocturia: Secondary | ICD-10-CM | POA: Diagnosis not present

## 2021-07-20 LAB — LIPID PANEL
Cholesterol: 202 mg/dL — ABNORMAL HIGH (ref 0–200)
HDL: 83.8 mg/dL (ref >39.00)
LDL Cholesterol: 98 mg/dL (ref 0–99)
NonHDL: 118.36
Total CHOL/HDL Ratio: 2
Triglycerides: 100 mg/dL (ref 0.0–149.0)
VLDL: 20 mg/dL (ref 0.0–40.0)

## 2021-07-20 LAB — URINALYSIS, ROUTINE W REFLEX MICROSCOPIC
Bilirubin Urine: NEGATIVE
Ketones, ur: NEGATIVE
Leukocytes,Ua: NEGATIVE
Nitrite: NEGATIVE
Specific Gravity, Urine: 1.01 (ref 1.000–1.030)
Total Protein, Urine: NEGATIVE
Urine Glucose: NEGATIVE
Urobilinogen, UA: 0.2 (ref 0.0–1.0)
pH: 7.5 (ref 5.0–8.0)

## 2021-07-20 LAB — COMPREHENSIVE METABOLIC PANEL
ALT: 15 U/L (ref 0–35)
AST: 20 U/L (ref 0–37)
Albumin: 4.6 g/dL (ref 3.5–5.2)
Alkaline Phosphatase: 48 U/L (ref 39–117)
BUN: 17 mg/dL (ref 6–23)
CO2: 31 mEq/L (ref 19–32)
Calcium: 9.9 mg/dL (ref 8.4–10.5)
Chloride: 100 mEq/L (ref 96–112)
Creatinine, Ser: 0.67 mg/dL (ref 0.40–1.20)
GFR: 89.18 mL/min (ref >60.00)
Glucose, Bld: 97 mg/dL (ref 70–99)
Potassium: 4.4 mEq/L (ref 3.5–5.1)
Sodium: 139 mEq/L (ref 135–145)
Total Bilirubin: 0.5 mg/dL (ref 0.2–1.2)
Total Protein: 7.1 g/dL (ref 6.0–8.3)

## 2021-07-20 MED ORDER — COMBIVENT RESPIMAT 20-100 MCG/ACT IN AERS: 1.0000 | INHALATION_SPRAY | Freq: Four times a day (QID) | RESPIRATORY_TRACT | 3 refills | Status: DC | PRN

## 2021-07-20 NOTE — Patient Instructions (Addendum)
A few things to remember from today's visit:  Routine general medical examination at a health care facility  Hyperlipidemia, unspecified hyperlipidemia type - Plan: Comprehensive metabolic panel, Lipid panel  Nocturia - Plan: Urinalysis, Routine w reflex microscopic  Chronic RUQ pain - Plan: Urinalysis, Routine w reflex microscopic  Chronic obstructive pulmonary disease, unspecified COPD type ([place]), Chronic - Plan: Ipratropium-Albuterol (COMBIVENT RESPIMAT) 20-100 MCG/ACT AERS respimat  Aortic atherosclerosis ([place]), Chronic  Sent inhaler to use before physical activity like Yard work.  If you need refills please call your pharmacy. Do not use My Chart to request refills or for acute issues that need immediate attention.    Please be sure medication list is accurate. If a new problem present, please set up appointment sooner than planned today.  Preventive Care 79 Years and Older, Female Preventive care refers to lifestyle choices and visits with your health care provider that can promote health and wellness. Preventive care visits are also called wellness exams. What can I expect for my preventive care visit? Counseling Your health care provider may ask you questions about your: Medical history, including: Past medical problems. Family medical history. Pregnancy and menstrual history. History of falls. Current health, including: Memory and ability to understand (cognition). Emotional well-being. Home life and relationship well-being. Sexual activity and sexual health. Lifestyle, including: Alcohol, nicotine or tobacco, and drug use. Access to firearms. Diet, exercise, and sleep habits. Work and work Statistician. Sunscreen use. Safety issues such as seatbelt and bike helmet use. Physical exam Your health care provider will check your: Height and weight. These may be used to calculate your BMI (body mass index). BMI is a measurement that tells if you are at a healthy  weight. Waist circumference. This measures the distance around your waistline. This measurement also tells if you are at a healthy weight and may help predict your risk of certain diseases, such as type 2 diabetes and high blood pressure. Heart rate and blood pressure. Body temperature. Skin for abnormal spots. What immunizations do I need?  Vaccines are usually given at various ages, according to a schedule. Your health care provider will recommend vaccines for you based on your age, medical history, and lifestyle or other factors, such as travel or where you work. What tests do I need? Screening Your health care provider may recommend screening tests for certain conditions. This may include: Lipid and cholesterol levels. Hepatitis C test. Hepatitis B test. HIV (human immunodeficiency virus) test. STI (sexually transmitted infection) testing, if you are at risk. Lung cancer screening. Colorectal cancer screening. Diabetes screening. This is done by checking your blood sugar (glucose) after you have not eaten for a while (fasting). Mammogram. Talk with your health care provider about how often you should have regular mammograms. BRCA-related cancer screening. This may be done if you have a family history of breast, ovarian, tubal, or peritoneal cancers. Bone density scan. This is done to screen for osteoporosis. Talk with your health care provider about your test results, treatment options, and if necessary, the need for more tests. Follow these instructions at home: Eating and drinking  Eat a diet that includes fresh fruits and vegetables, whole grains, lean protein, and low-fat dairy products. Limit your intake of foods with high amounts of sugar, saturated fats, and salt. Take vitamin and mineral supplements as recommended by your health care provider. Do not drink alcohol if your health care provider tells you not to drink. If you drink alcohol: Limit how much you have to 0-1  drink  a day. Know how much alcohol is in your drink. In the [place] one drink equals one 12 oz bottle of beer (355 mL), one 5 oz glass of wine (148 mL), or one 1 oz glass of hard liquor (44 mL). Lifestyle Brush your teeth every morning and night with fluoride toothpaste. Floss one time each day. Exercise for at least 30 minutes 5 or more days each week. Do not use any products that contain nicotine or tobacco. These products include cigarettes, chewing tobacco, and vaping devices, such as e-cigarettes. If you need help quitting, ask your health care provider. Do not use drugs. If you are sexually active, practice safe sex. Use a condom or other form of protection in order to prevent STIs. Take aspirin only as told by your health care provider. Make sure that you understand how much to take and what form to take. Work with your health care provider to find out whether it is safe and beneficial for you to take aspirin daily. Ask your health care provider if you need to take a cholesterol-lowering medicine (statin). Find healthy ways to manage stress, such as: Meditation, yoga, or listening to music. Journaling. Talking to a trusted person. Spending time with friends and family. Minimize exposure to UV radiation to reduce your risk of skin cancer. Safety Always wear your seat belt while driving or riding in a vehicle. Do not drive: If you have been drinking alcohol. Do not ride with someone who has been drinking. When you are tired or distracted. While texting. If you have been using any mind-altering substances or drugs. Wear a helmet and other protective equipment during sports activities. If you have firearms in your house, make sure you follow all gun safety procedures. What's next? Visit your health care provider once a year for an annual wellness visit. Ask your health care provider how often you should have your eyes and teeth checked. Stay up to date on all vaccines. This information is  not intended to replace advice given to you by your health care provider. Make sure you discuss any questions you have with your health care provider. Document Revised: 06/18/2020 Document Reviewed: 06/18/2020 Elsevier Patient Education  [place].

## 2021-07-20 NOTE — Assessment & Plan Note (Signed)
Recommend trial of Combivent Respimat 20-100 mcg 1 puff qid prn, she can try before doing yard work. Encouraged smoking cessation.

## 2021-07-20 NOTE — Assessment & Plan Note (Signed)
On Simvastatin 10 mg and Aspirin 81 mg daily.

## 2021-07-20 NOTE — Assessment & Plan Note (Signed)
Continue Simvastatin 10 mg daily and low fat diet. Further recommendations according to lipid panel result.

## 2021-07-22 ENCOUNTER — Encounter: Payer: Self-pay | Admitting: Family Medicine

## 2021-07-22 MED ORDER — SIMVASTATIN 10 MG PO TABS: ORAL_TABLET | ORAL | 3 refills | Status: DC

## 2021-07-23 ENCOUNTER — Telehealth: Payer: Self-pay | Admitting: Family Medicine

## 2021-07-23 NOTE — Telephone Encounter (Signed)
Pt is calling and simvastatin (ZOCOR) 10 MG tablet was sent to pharm . Pt thought it suppose to increase to 20 mg. Please advise. Pt is aware md not in office today and will be back tomorrow

## 2021-07-24 MED ORDER — SIMVASTATIN 20 MG PO TABS: 20.0000 mg | ORAL_TABLET | Freq: Every day | ORAL | 3 refills | Status: DC

## 2021-07-24 NOTE — Telephone Encounter (Signed)
New Rx sent in for 20 mg to pt's pharmacy.

## 2021-07-27 DIAGNOSIS — H5203 Hypermetropia, bilateral: Secondary | ICD-10-CM | POA: Diagnosis not present

## 2021-07-27 DIAGNOSIS — H1045 Other chronic allergic conjunctivitis: Secondary | ICD-10-CM | POA: Diagnosis not present

## 2021-07-27 DIAGNOSIS — H2513 Age-related nuclear cataract, bilateral: Secondary | ICD-10-CM | POA: Diagnosis not present

## 2021-07-27 DIAGNOSIS — H0102B Squamous blepharitis left eye, upper and lower eyelids: Secondary | ICD-10-CM | POA: Diagnosis not present

## 2021-07-27 DIAGNOSIS — H04123 Dry eye syndrome of bilateral lacrimal glands: Secondary | ICD-10-CM | POA: Diagnosis not present

## 2021-07-27 DIAGNOSIS — H0102A Squamous blepharitis right eye, upper and lower eyelids: Secondary | ICD-10-CM | POA: Diagnosis not present

## 2021-07-27 NOTE — Telephone Encounter (Signed)
Pt is aware rx had been sent to [facility]

## 2021-08-04 ENCOUNTER — Telehealth: Payer: Self-pay | Admitting: Family Medicine

## 2021-08-04 NOTE — Telephone Encounter (Signed)
Left message for patient to call back and schedule Medicare Annual Wellness Visit (AWV) either virtually or in office. Left  my Tanya [name] number [phone]   Last AWV ;08/12/20  please schedule at anytime with [facility] Nurse Health Advisor 1 or 2

## 2021-08-31 ENCOUNTER — Ambulatory Visit (HOSPITAL_BASED_OUTPATIENT_CLINIC_OR_DEPARTMENT_OTHER)
Admission: RE | Admit: 2021-08-31 | Discharge: 2021-08-31 | Disposition: A | Discharge status: Home / Self Care | Payer: PPO | Source: Ambulatory Visit | Attending: Acute Care | Admitting: Acute Care

## 2021-08-31 DIAGNOSIS — F17210 Nicotine dependence, cigarettes, uncomplicated: Secondary | ICD-10-CM | POA: Diagnosis not present

## 2021-08-31 DIAGNOSIS — Z87891 Personal history of nicotine dependence: Secondary | ICD-10-CM | POA: Diagnosis not present

## 2021-09-01 ENCOUNTER — Telehealth: Payer: Self-pay | Admitting: Family Medicine

## 2021-09-01 NOTE — Telephone Encounter (Signed)
Left message for patient to call back and schedule Medicare Annual Wellness Visit (AWV) either virtually or in office. Left  my Tanya [name] number [phone]   Last AWV 08/06/20 ; please schedule at anytime with [facility] Nurse Health Advisor 1 or 2

## 2021-09-02 ENCOUNTER — Other Ambulatory Visit (HOSPITAL_COMMUNITY)
Admission: RE | Admit: 2021-09-02 | Discharge: 2021-09-02 | Disposition: A | Discharge status: Home / Self Care | Payer: PPO | Source: Ambulatory Visit | Attending: Obstetrics & Gynecology | Admitting: Obstetrics & Gynecology

## 2021-09-02 ENCOUNTER — Other Ambulatory Visit: Payer: Self-pay

## 2021-09-02 ENCOUNTER — Encounter: Payer: Self-pay | Admitting: Obstetrics & Gynecology

## 2021-09-02 ENCOUNTER — Ambulatory Visit (INDEPENDENT_AMBULATORY_CARE_PROVIDER_SITE_OTHER): Payer: PPO | Admitting: Obstetrics & Gynecology

## 2021-09-02 VITALS — BP 116/72 | HR 89 | Ht 64.5 in | Wt 132.0 lb

## 2021-09-02 DIAGNOSIS — F17210 Nicotine dependence, cigarettes, uncomplicated: Secondary | ICD-10-CM

## 2021-09-02 DIAGNOSIS — Z01419 Encounter for gynecological examination (general) (routine) without abnormal findings: Secondary | ICD-10-CM | POA: Insufficient documentation

## 2021-09-02 DIAGNOSIS — Z124 Encounter for screening for malignant neoplasm of cervix: Secondary | ICD-10-CM | POA: Diagnosis not present

## 2021-09-02 DIAGNOSIS — Z122 Encounter for screening for malignant neoplasm of respiratory organs: Secondary | ICD-10-CM

## 2021-09-02 DIAGNOSIS — Z87891 Personal history of nicotine dependence: Secondary | ICD-10-CM

## 2021-09-02 DIAGNOSIS — Z9189 Other specified personal risk factors, not elsewhere classified: Secondary | ICD-10-CM

## 2021-09-02 DIAGNOSIS — Z780 Asymptomatic menopausal state: Secondary | ICD-10-CM

## 2021-09-02 DIAGNOSIS — Z853 Personal history of malignant neoplasm of breast: Secondary | ICD-10-CM

## 2021-09-02 DIAGNOSIS — M8589 Other specified disorders of bone density and structure, multiple sites: Secondary | ICD-10-CM

## 2021-09-02 NOTE — Progress Notes (Signed)
Tanya [name] [birth year] [id number]   History:    71 y.o.   Husband passed away 11-12-18.   RP:  Established patient presenting for annual gyn exam    HPI: Postmenopause, well on no HRT.  No PMB.  No pelvic pain. Abstinent. Pap Neg 08/2019.  No h/o abnormal Pap.  Pap reflex today.  Urine/BMs wnl.  H/O Left recurrent Breast Cancer followed by [facility].  S/P Left Mastectomy.  Mammo Rt Neg 03/2021.  BMI 22.31.  BD 12/2019 Osteopenia, T-Score -2.1 at Rt Fem Neck.  Took Alendronate in the past which caused joint pains.  Left hip replacement done. COLONOSCOPY: 12-16-17.   Past medical history,surgical history, family history and social history were all reviewed and documented in the EPIC chart.  Gynecologic History No LMP recorded. Patient is postmenopausal.  Obstetric History OB History  Gravida Para Term Preterm AB Living  3 0     3 0  SAB IAB Ectopic Multiple Live Births  1 2          # Outcome Date GA Lbr Len/2nd Weight Sex Delivery Anes PTL Lv  3 SAB           2 IAB           1 IAB              ROS: A ROS was performed and pertinent positives and negatives are included in the history.  GENERAL: No fevers or chills. HEENT: No change in vision, no earache, sore throat or sinus congestion. NECK: No pain or stiffness. CARDIOVASCULAR: No chest pain or pressure. No palpitations. PULMONARY: No shortness of breath, cough or wheeze. GASTROINTESTINAL: No abdominal pain, nausea, vomiting or diarrhea, melena or bright red blood per rectum. GENITOURINARY: No urinary frequency, urgency, hesitancy or dysuria. MUSCULOSKELETAL: No joint or muscle pain, no back pain, no recent trauma. DERMATOLOGIC: No rash, no itching, no lesions. ENDOCRINE: No polyuria, polydipsia, no heat or cold intolerance. No recent change in weight. HEMATOLOGICAL: No anemia or easy bruising or bleeding. NEUROLOGIC: No headache, seizures, numbness, tingling or weakness. PSYCHIATRIC: No depression, no loss of interest in normal  activity or change in sleep pattern.     Exam:  BP 116/72   Pulse 89   Ht 5' 4.5" (1.638 m)   Wt 132 lb (59.9 kg)   SpO2 98%   BMI 22.31 kg/m   Body mass index is 22.31 kg/m.  General appearance : Well developed well nourished female. No acute distress HEENT: Eyes: no retinal hemorrhage or exudates,  Neck supple, trachea midline, no carotid bruits, no thyroidmegaly Lungs: Clear to auscultation, no rhonchi or wheezes, or rib retractions  Heart: Regular rate and rhythm, no murmurs or gallops Breast:Examined in sitting and supine position were symmetrical in appearance, no palpable masses or tenderness,  no skin retraction, no nipple inversion, no nipple discharge, no skin discoloration, no axillary or supraclavicular lymphadenopathy Abdomen: no palpable masses or tenderness, no rebound or guarding Extremities: no edema or skin discoloration or tenderness  Pelvic: Vulva: Normal             Vagina: No gross lesions or discharge  Cervix: No gross lesions or discharge.  Pap reflex done.  Uterus  AV, normal size, shape and consistency, non-tender and mobile  Adnexa  Without masses or tenderness  Anus: Normal   Assessment/Plan:  71 y.o. female for annual exam   1. Encounter for routine gynecological examination with Papanicolaou smear of cervix Postmenopause, well on  no HRT.  No PMB.  No pelvic pain. Abstinent. Pap Neg 08/2019.  No h/o abnormal Pap.  Pap reflex today.  Urine/BMs wnl.  H/O Left recurrent Breast Cancer followed by Onco.  S/P Left Mastectomy.  Mammo Rt Neg 03/2021.  BMI 22.31.  BD 12/2019 Osteopenia, T-Score -2.1 at Rt Fem Neck.  Took Alendronate in the past which caused joint pains.  Left hip replacement done. COLONOSCOPY: 12-16-17. - Cytology - PAP( [place])  2. Postmenopause Postmenopause, well on no HRT.  No PMB.  No pelvic pain. Abstinent.  - DG Bone Density; Future  3. Osteopenia of multiple sites Last BD 01/01/2020 at the [place] Osteopenia Rt Fem Neck  T -Score -2.1.  Repeat BD in 01/2022 at the [facility].  Vit D, Ca++ 1.5 g/d total, wt bearing activities. - DG Bone Density; Future  4. Personal history of breast cancer S/P Lt Mastectomy.  5. Other specified personal risk factors, not elsewhere classified   [name] MD, 10:12 AM 09/02/2021

## 2021-09-08 LAB — CYTOLOGY - PAP: Diagnosis: NEGATIVE

## 2021-09-23 ENCOUNTER — Telehealth: Payer: Self-pay | Admitting: Family Medicine

## 2021-09-23 NOTE — Telephone Encounter (Signed)
Left message for patient to call back and schedule Medicare Annual Wellness Visit (AWV) either virtually or in office. Left  my Herbie Drape number [phone]   Last AWV ;08/12/20  please schedule at anytime with [facility] Nurse Health Advisor 1 or 2

## 2021-10-16 DIAGNOSIS — Z96642 Presence of left artificial hip joint: Secondary | ICD-10-CM | POA: Diagnosis not present

## 2021-10-23 ENCOUNTER — Encounter: Payer: Self-pay | Admitting: Family Medicine

## 2021-10-27 ENCOUNTER — Telehealth: Payer: Self-pay | Admitting: Family Medicine

## 2021-10-27 NOTE — Telephone Encounter (Signed)
Left message for patient to call back and schedule Medicare Annual Wellness Visit (AWV) either virtually or in office. Left  my jabber number [phone]   Last AWV 08/12/20 please schedule with Nurse Health Adviser   45 min for awv-i and in office appointments 30 min for awv-s  phone/virtual appointments  

## 2021-11-06 ENCOUNTER — Encounter: Payer: Self-pay | Admitting: Family Medicine

## 2021-11-24 ENCOUNTER — Telehealth: Payer: Self-pay | Admitting: Family Medicine

## 2021-11-24 NOTE — Telephone Encounter (Signed)
Left message for patient to call back and schedule Medicare Annual Wellness Visit (AWV) either virtually or in office. Left  my Tanya [name] number [phone]   Last AWV 08/12/20 please schedule with Nurse Health Adviser   45 min for awv-i and in office appointments 30 min for awv-s  phone/virtual appointments

## 2022-01-12 ENCOUNTER — Telehealth: Payer: Self-pay | Admitting: Family Medicine

## 2022-01-12 NOTE — Telephone Encounter (Signed)
Left message for patient to call back and schedule Medicare Annual Wellness Visit (AWV) either virtually or in office. Left  my Herbie Drape number [phone]   Last AWV  08/12/20 please schedule with Nurse Health Adviser   45 min for awv-i  in office appointments 30 min for awv-s & awv-i in office/ phone/virtual appointments

## 2022-01-26 ENCOUNTER — Ambulatory Visit (INDEPENDENT_AMBULATORY_CARE_PROVIDER_SITE_OTHER): Payer: PPO

## 2022-01-26 DIAGNOSIS — Z13820 Encounter for screening for osteoporosis: Secondary | ICD-10-CM

## 2022-01-26 DIAGNOSIS — M8589 Other specified disorders of bone density and structure, multiple sites: Secondary | ICD-10-CM

## 2022-01-26 DIAGNOSIS — Z780 Asymptomatic menopausal state: Secondary | ICD-10-CM

## 2022-01-27 ENCOUNTER — Other Ambulatory Visit: Payer: Self-pay | Admitting: Obstetrics & Gynecology

## 2022-01-27 DIAGNOSIS — Z1231 Encounter for screening mammogram for malignant neoplasm of breast: Secondary | ICD-10-CM

## 2022-02-01 ENCOUNTER — Encounter: Payer: Self-pay | Admitting: Obstetrics & Gynecology

## 2022-02-08 ENCOUNTER — Ambulatory Visit (INDEPENDENT_AMBULATORY_CARE_PROVIDER_SITE_OTHER): Payer: PPO | Admitting: Family Medicine

## 2022-02-08 ENCOUNTER — Other Ambulatory Visit: Payer: Self-pay

## 2022-02-08 VITALS — BP 120/74 | HR 97 | Temp 98.0°F | Resp 16 | Ht 64.5 in | Wt 137.0 lb

## 2022-02-08 DIAGNOSIS — Z170 Estrogen receptor positive status [ER+]: Secondary | ICD-10-CM

## 2022-02-08 DIAGNOSIS — J449 Chronic obstructive pulmonary disease, unspecified: Secondary | ICD-10-CM

## 2022-02-08 DIAGNOSIS — M542 Cervicalgia: Secondary | ICD-10-CM

## 2022-02-08 DIAGNOSIS — C50412 Malignant neoplasm of upper-outer quadrant of left female breast: Secondary | ICD-10-CM | POA: Diagnosis not present

## 2022-02-08 DIAGNOSIS — E785 Hyperlipidemia, unspecified: Secondary | ICD-10-CM

## 2022-02-08 DIAGNOSIS — I700 Atherosclerosis of aorta: Secondary | ICD-10-CM | POA: Diagnosis not present

## 2022-02-08 DIAGNOSIS — H9202 Otalgia, left ear: Secondary | ICD-10-CM

## 2022-02-08 DIAGNOSIS — R590 Localized enlarged lymph nodes: Secondary | ICD-10-CM

## 2022-02-08 LAB — CBC WITH DIFFERENTIAL/PLATELET
Basophils Absolute: 0 10*3/uL (ref 0.0–0.1)
Basophils Relative: 0.6 % (ref 0.0–3.0)
Eosinophils Absolute: 0.1 10*3/uL (ref 0.0–0.7)
Eosinophils Relative: 1.5 % (ref 0.0–5.0)
HCT: 43.6 % (ref 36.0–46.0)
Hemoglobin: 15.1 g/dL — ABNORMAL HIGH (ref 12.0–15.0)
Lymphocytes Relative: 25.4 % (ref 12.0–46.0)
Lymphs Abs: 1.5 10*3/uL (ref 0.7–4.0)
MCHC: 34.6 g/dL (ref 30.0–36.0)
MCV: 99.4 fl (ref 78.0–100.0)
Monocytes Absolute: 0.5 10*3/uL (ref 0.1–1.0)
Monocytes Relative: 8.9 % (ref 3.0–12.0)
Neutro Abs: 3.7 10*3/uL (ref 1.4–7.7)
Neutrophils Relative %: 63.6 % (ref 43.0–77.0)
Platelets: 213 10*3/uL (ref 150.0–400.0)
RBC: 4.38 Mil/uL (ref 3.87–5.11)
RDW: 12.7 % (ref 11.5–15.5)
WBC: 5.9 10*3/uL (ref 4.0–10.5)

## 2022-02-08 MED ORDER — CELECOXIB 100 MG PO CAPS
100.0000 mg | ORAL_CAPSULE | Freq: Every day | ORAL | 0 refills | Status: AC
Start: 2022-02-08 — End: 2022-02-18

## 2022-02-08 NOTE — Progress Notes (Signed)
ACUTE VISIT Chief Complaint  Patient presents with   Otalgia    Left ear x a week, happens mainly at night and will have jaw & neck pain on the left side as well.   HPI: Tanya [name] is a 72 y.o. female with PMHx significant for Aortic atherosclerosis,tobacco use disorder,COPD, OA,and hx of breat cancer here today complaining of left earache for the past week, accompanied by jaw pain and neck pain on the same side.  She denies any recent upper respiratory infection,nasal congestion, drainage from the ear, or changes in hearing.   She reports that the pain is most severe at night, rating it as a 9 out of 10, and is particularly noticeable when getting up to use the bathroom. During the day, the pain is minimal and does not bother her. She has been taking ibuprofen for pain management when necessary.  Negative for difficulty swallowing, or dental issues.   She also reports occasional minimal epistaxis in the morning, which she attributes to the dryness. She has been using a humidifier to alleviate this issue.  Otalgia  There is pain in the left ear. This is a new problem. The problem has been unchanged. There has been no fever. The pain is severe. Pertinent negatives include no abdominal pain, diarrhea, headaches, rhinorrhea, sore throat or vomiting. She has tried NSAIDs for the symptoms. The treatment provided mild relief.   She has a history of breast cancer but has been in remission for several years. She is not longer following with oncologist.  COPD: No changes in frequency of cough. + Smoker. On Combivent 20-100 mcg qid prn.  She as been experiencing cervical pain for approximately six months, describing a "crunching" sensation with movement. Pain does not radiate down her arm. Negative for UE's numbness, tingling,or weakness. Stable.  She also would like to go through results of chest CT done on 09/01/21. States that she noted coronary calcifications, which were not  discussed with her. 1. Lung-RADS 2, benign appearance or behavior. Continue annual screening with low-dose chest CT without contrast in 12 months. 2. Coronary artery calcifications. 3. Aortic Atherosclerosis (ICD10-I70.0) and Emphysema (ICD10-J43.9).  HLD and aortic atherosclerosis on Simvastatin 20 mg daily. She is also on Aspirin 81 mg daily. Denies chest pain, dyspnea, palpitation,orthopnea,PND, or edema.  Concerned because her brother has hx of CAD and states that initial disease was noted on CT.  Review of Systems  Constitutional:  Positive for fatigue. Negative for activity change, appetite change and unexpected weight change.  HENT:  Positive for ear pain. Negative for rhinorrhea and sore throat.   Gastrointestinal:  Negative for abdominal pain, diarrhea and vomiting.  Neurological:  Negative for syncope, weakness and headaches.  See other pertinent positives and negatives in HPI.  Current Outpatient Medications on File Prior to Visit  Medication Sig Dispense Refill   aspirin EC 81 MG tablet Take 81 mg by mouth daily. Swallow whole.     Calcium Carbonate-Vitamin D 600-400 MG-UNIT tablet Take 1 tablet by mouth in the morning and at bedtime.     carboxymethylcellulose (REFRESH PLUS) 0.5 % SOLN Place 1 drop into both eyes 3 (three) times daily as needed (tired/dry/irritated eyes.).     cholecalciferol (VITAMIN D3) 25 MCG (1000 UNIT) tablet Take 1,000 Units by mouth in the morning and at bedtime.     Ipratropium-Albuterol (COMBIVENT RESPIMAT) 20-100 MCG/ACT AERS respimat Inhale 1 puff into the lungs every 6 (six) hours as needed for wheezing. 4 g 3  Misc Natural Products (OSTEO BI-FLEX TRIPLE STRENGTH) TABS Take 1 tablet by mouth 2 (two) times daily.     Multiple Vitamin (MULTIVITAMIN WITH MINERALS) TABS tablet Take 1 tablet by mouth daily with breakfast.     simvastatin (ZOCOR) 20 MG tablet Take 1 tablet (20 mg total) by mouth at bedtime. 90 tablet 3   No current  facility-administered medications on file prior to visit.   Past Medical History:  Diagnosis Date   Arthritis    left knee and hip   Breast cancer ([place])    Radiation and lumpectomy  mastectomy and chemo   COPD, mild ([place])    Diverticulosis    Family history of breast cancer    Family history of uterine cancer    Hyperlipidemia    Internal hemorrhoids    Osteopenia    DEXA 10/27/11: -1.4, no change from 10/2009   PONV (postoperative nausea and vomiting)    Severe   Shingles    Smoker    Allergies  Allergen Reactions   Tetanus Toxoid Other (See Comments)    shock   Codeine Rash   Social History   Socioeconomic History   Marital status: Widowed    Spouse name: Not on file   Number of children: 0   Years of education: Not on file   Highest education level: Bachelor's degree (e.g., BA, AB, BS)  Occupational History   Not on file  Tobacco Use   Smoking status: Every Day    Packs/day: 1.00    Years: 49.00    Total pack years: 49.00    Types: Cigarettes   Smokeless tobacco: Never   Tobacco comments:    Trying to smoke less.   Vaping Use   Vaping Use: Never used  Substance and Sexual Activity   Alcohol use: Not Currently    Comment: daily   Drug use: No   Sexual activity: Not Currently    Partners: Male    Birth control/protection: Post-menopausal    Comment: older than 58, more than 5  Other Topics Concern   Not on file  Social History Narrative   Lives at home with husband who is disabled.     Social Determinants of Health   Financial Resource Strain: Low Risk  (02/08/2022)   Overall Financial Resource Strain (CARDIA)    Difficulty of Paying Living Expenses: Not hard at all  Food Insecurity: No Food Insecurity (02/08/2022)   Hunger Vital Sign    Worried About Running Out of Food in the Last Year: Never true    Ran Out of Food in the Last Year: Never true  Transportation Needs: No Transportation Needs (02/08/2022)   PRAPARE - Radiographer, therapeutic (Medical): No    Lack of Transportation (Non-Medical): No  Physical Activity: Unknown (02/08/2022)   Exercise Vital Sign    Days of Exercise per Week: 0 days    Minutes of Exercise per Session: Not on file  Stress: No Stress Concern Present (02/08/2022)   [place]    Feeling of Stress : Not at all  Social Connections: Unknown (02/08/2022)   Social Connection and Isolation Panel [NHANES]    Frequency of Communication with Friends and Family: Patient refused    Frequency of Social Gatherings with Friends and Family: Patient refused    Attends Religious Services: Patient refused    Active Member of Clubs or Organizations: Patient refused    Attends Club or  Organization Meetings: Not on file    Marital Status: Widowed   Vitals:   02/08/22 1022  BP: 120/74  Pulse: 97  Resp: 16  Temp: 98 F (36.7 C)  SpO2: 97%   Body mass index is 23.15 kg/m.  Physical Exam Vitals and nursing note reviewed.  Constitutional:      General: She is not in acute distress.    Appearance: She is well-developed. She is not ill-appearing.  HENT:     Head: Normocephalic and atraumatic.     Right Ear: Tympanic membrane, ear canal and external ear normal.     Left Ear: Tympanic membrane, ear canal and external ear normal.     Nose: No rhinorrhea.     Right Sinus: No maxillary sinus tenderness or frontal sinus tenderness.     Left Sinus: No maxillary sinus tenderness or frontal sinus tenderness.  Eyes:     Conjunctiva/sclera: Conjunctivae normal.  Cardiovascular:     Rate and Rhythm: Normal rate and regular rhythm.     Heart sounds: No murmur heard. Pulmonary:     Effort: Pulmonary effort is normal. No respiratory distress.     Breath sounds: Normal breath sounds. No stridor.  Musculoskeletal:     Cervical back: No edema or erythema. No muscular tenderness.  Lymphadenopathy:     Head:     Right side of head: No  submandibular adenopathy.     Left side of head: Posterior auricular adenopathy present. No submandibular or preauricular adenopathy.     Cervical: Cervical adenopathy present.     Left cervical: Superficial cervical adenopathy present.  Skin:    General: Skin is warm.     Findings: No erythema or rash.  Neurological:     Mental Status: She is alert and oriented to person, place, and time.  Psychiatric:        Speech: Speech normal.     Comments: Well groomed, good eye contact.   ASSESSMENT AND PLAN:  Ms. Britnay was seen today for otalgia.  Diagnoses and all orders for this visit: Orders Placed This Encounter  Procedures   CT Soft Tissue Neck W Contrast   CBC with Differential/Platelet   Lab Results  Component Value Date   WBC 5.9 02/08/2022   HGB 15.1 (H) 02/08/2022   HCT 43.6 02/08/2022   MCV 99.4 02/08/2022   PLT 213.0 02/08/2022   Cervical lymphadenopathy Noted on examination, mildly enlarged lymph nodes bilateral but left-sided are tender. We discussed possible etiologies. Pain is severe and given her hx of tobacco use and breast cancer, I think soft tissue neck CT is warrant.  Neck pain We discussed possible etiologies, most likely OA. Left-sided neck pain could be caused by lymphadenopathies. After discussion of some side effects she agrees with short course of Celebrex.  -     celecoxib (CELEBREX) 100 MG capsule; Take 1 capsule (100 mg total) by mouth daily for 10 days.  Earache on left Ear examination today negative. She will try 10 days of Celebrex at night. Monitor for new symptoms. If persistent ENT evaluation can be considered. -     celecoxib (CELEBREX) 100 MG capsule; Take 1 capsule (100 mg total) by mouth daily for 10 days.  Hyperlipidemia We discussed LDL goals, given chest CT findings, we can aim for LDL < 70. For now continue Simvastatin 20 mg daily, will plan on re-checking in 07/2022.  COPD (chronic obstructive pulmonary disease)  (HCC) Stable. Continue Combivent 20-100 mcg 1 puff qud prn. Smoking  cessation encouraged.  Aortic atherosclerosis ([place]) Seen initially on chest CT in 08/2017 and again on recent chest CT, 08/2021 along with coronary artery calcification. Asymptomatic. We discussed options, which include further cardiology work up like stress test. She agrees with treating modifiable risk factors. Continue Simvastatin and Aspirin 81 mg daily. Smoking cessation is vital.  Malignant neoplasm of upper-outer quadrant of left breast in female, estrogen receptor positive ([place]) Completed 5 years treatment of Tamoxifen and not longer following with oncologist.  Last mammogram 03/2021 [facility] 1, has appt for 03/2022.  I spent a total of 43 minutes in both face to face and non face to face activities for this visit on the date of this encounter. During this time history was obtained and documented, examination was performed, prior labs/imaging reviewed, and assessment/plan discussed.  Return if symptoms worsen or fail to improve, for keep next appointment.  Havana Baldwin G. [name], MD  [facility]. [place] office.

## 2022-02-08 NOTE — Assessment & Plan Note (Signed)
Stable. Continue Combivent 20-100 mcg 1 puff qud prn. Smoking cessation encouraged.

## 2022-02-08 NOTE — Assessment & Plan Note (Signed)
Seen initially on chest CT in 08/2017 and again on recent chest CT, 08/2021 along with coronary artery calcification. Asymptomatic. We discussed options, which include further cardiology work up like stress test. She agrees with treating modifiable risk factors. Continue Simvastatin and Aspirin 81 mg daily. Smoking cessation is vital.

## 2022-02-08 NOTE — Assessment & Plan Note (Signed)
We discussed LDL goals, given chest CT findings, we can aim for LDL < 70. For now continue Simvastatin 20 mg daily, will plan on re-checking in 07/2022.

## 2022-02-08 NOTE — Patient Instructions (Addendum)
A few things to remember from today's visit:  Cervical lymphadenopathy - Plan: CBC with Differential/Platelet, CT Soft Tissue Neck W Contrast  Atherosclerosis of aorta (HCC)  Neck pain - Plan: celecoxib (CELEBREX) 100 MG capsule  Hyperlipidemia, unspecified hyperlipidemia type  Earache on left - Plan: celecoxib (CELEBREX) 100 MG capsule  Neck pain is most likely muscular. Range of motion exercises and topical icy hot may help. Ear exam negative today. Because enlarged and tender lymph nodes on examination today as well as tobacco use, a neck CT was ordered. Monitor for new symptoms. Celebrex 100 mg at bedtime for 7-10 days.  If you need refills for medications you take chronically, please call your pharmacy. Do not use My Chart to request refills or for acute issues that need immediate attention. If you send a my chart message, it may take a few days to be addressed, specially if I am not in the office.  Please be sure medication list is accurate. If a new problem present, please set up appointment sooner than planned today.

## 2022-02-08 NOTE — Assessment & Plan Note (Signed)
Completed 5 years treatment of Tamoxifen and not longer following with oncologist.  Last mammogram 03/2021 [facility]-rads 1, has appt for 03/2022.

## 2022-02-22 ENCOUNTER — Ambulatory Visit (INDEPENDENT_AMBULATORY_CARE_PROVIDER_SITE_OTHER)
Admission: RE | Admit: 2022-02-22 | Discharge: 2022-02-22 | Disposition: A | Discharge status: Home / Self Care | Payer: PPO | Source: Ambulatory Visit | Attending: Family Medicine | Admitting: Family Medicine

## 2022-02-22 ENCOUNTER — Ambulatory Visit (INDEPENDENT_AMBULATORY_CARE_PROVIDER_SITE_OTHER): Payer: PPO | Admitting: Family Medicine

## 2022-02-22 ENCOUNTER — Encounter: Payer: Self-pay | Admitting: Family Medicine

## 2022-02-22 VITALS — BP 120/70 | HR 94 | Temp 98.3°F | Resp 16 | Ht 64.5 in | Wt 138.0 lb

## 2022-02-22 DIAGNOSIS — M542 Cervicalgia: Secondary | ICD-10-CM | POA: Diagnosis not present

## 2022-02-22 DIAGNOSIS — R590 Localized enlarged lymph nodes: Secondary | ICD-10-CM | POA: Diagnosis not present

## 2022-02-22 DIAGNOSIS — R519 Headache, unspecified: Secondary | ICD-10-CM | POA: Diagnosis not present

## 2022-02-22 MED ORDER — TIZANIDINE HCL 4 MG PO TABS: 2.0000 mg | ORAL_TABLET | Freq: Every day | ORAL | 0 refills | Status: AC

## 2022-02-22 NOTE — Progress Notes (Unsigned)
ACUTE VISIT Chief Complaint  Patient presents with   Follow-up    Having headaches now, having to use 3 Ibuprofen to help.    HPI: Tanya [name] is a 72 y.o. female, who is here today complaining of *** Headache  This is a new problem.   She was seen on 02/08/22 for left earache, noted cervical mass and pending neck CT.  Review of Systems  Neurological:  Positive for headaches.  See other pertinent positives and negatives in HPI.  Current Outpatient Medications on File Prior to Visit  Medication Sig Dispense Refill   aspirin EC 81 MG tablet Take 81 mg by mouth daily. Swallow whole.     Calcium Carbonate-Vitamin D 600-400 MG-UNIT tablet Take 1 tablet by mouth in the morning and at bedtime.     carboxymethylcellulose (REFRESH PLUS) 0.5 % SOLN Place 1 drop into both eyes 3 (three) times daily as needed (tired/dry/irritated eyes.).     cholecalciferol (VITAMIN D3) 25 MCG (1000 UNIT) tablet Take 1,000 Units by mouth in the morning and at bedtime.     Ipratropium-Albuterol (COMBIVENT RESPIMAT) 20-100 MCG/ACT AERS respimat Inhale 1 puff into the lungs every 6 (six) hours as needed for wheezing. 4 g 3   Misc Natural Products (OSTEO BI-FLEX TRIPLE STRENGTH) TABS Take 1 tablet by mouth 2 (two) times daily.     Multiple Vitamin (MULTIVITAMIN WITH MINERALS) TABS tablet Take 1 tablet by mouth daily with breakfast.     simvastatin (ZOCOR) 20 MG tablet Take 1 tablet (20 mg total) by mouth at bedtime. 90 tablet 3   No current facility-administered medications on file prior to visit.    Past Medical History:  Diagnosis Date   Arthritis    left knee and hip   Breast cancer ([place])    Radiation and lumpectomy  mastectomy and chemo   COPD, mild ([place])    Diverticulosis    Family history of breast cancer    Family history of uterine cancer    Hyperlipidemia    Internal hemorrhoids    Osteopenia    DEXA 10/27/11: -1.4, no change from 10/2009   PONV (postoperative nausea and vomiting)     Severe   Shingles    Smoker    Allergies  Allergen Reactions   Tetanus Toxoid Other (See Comments)    shock   Codeine Rash    Social History   Socioeconomic History   Marital status: Widowed    Spouse name: Not on file   Number of children: 0   Years of education: Not on file   Highest education level: Bachelor's degree (e.g., BA, AB, BS)  Occupational History   Not on file  Tobacco Use   Smoking status: Every Day    Packs/day: 1.00    Years: 49.00    Total pack years: 49.00    Types: Cigarettes   Smokeless tobacco: Never   Tobacco comments:    Trying to smoke less.   Vaping Use   Vaping Use: Never used  Substance and Sexual Activity   Alcohol use: Not Currently    Comment: daily   Drug use: No   Sexual activity: Not Currently    Partners: Male    Birth control/protection: Post-menopausal    Comment: older than 31, more than 5  Other Topics Concern   Not on file  Social History Narrative   Lives at home with husband who is disabled.     Social Determinants of Health   Financial  Resource Strain: Low Risk  (02/08/2022)   Overall Financial Resource Strain (CARDIA)    Difficulty of Paying Living Expenses: Not hard at all  Food Insecurity: No Food Insecurity (02/08/2022)   Hunger Vital Sign    Worried About Running Out of Food in the Last Year: Never true    Ran Out of Food in the Last Year: Never true  Transportation Needs: No Transportation Needs (02/08/2022)   PRAPARE - Hydrologist (Medical): No    Lack of Transportation (Non-Medical): No  Physical Activity: Unknown (02/08/2022)   Exercise Vital Sign    Days of Exercise per Week: 0 days    Minutes of Exercise per Session: Not on file  Stress: No Stress Concern Present (02/08/2022)   [place]    Feeling of Stress : Not at all  Social Connections: Unknown (02/08/2022)   Social Connection and Isolation Panel [NHANES]     Frequency of Communication with Friends and Family: Patient refused    Frequency of Social Gatherings with Friends and Family: Patient refused    Attends Religious Services: Patient refused    Active Member of Clubs or Organizations: Patient refused    Attends Archivist Meetings: Not on file    Marital Status: Widowed    Vitals:   02/22/22 0951  BP: 120/70  Pulse: 94  Resp: 16  Temp: 98.3 F (36.8 C)  SpO2: 98%   Body mass index is 23.32 kg/m.  Physical Exam Vitals and nursing note reviewed.  Constitutional:      General: She is not in acute distress.    Appearance: She is well-developed.  HENT:     Head: Normocephalic and atraumatic.     Right Ear: Ear canal and external ear normal.     Left Ear: Tympanic membrane, ear canal and external ear normal.     Ears:     Comments: Right ear canal excess cerumen, TM seen partially.    Mouth/Throat:     Mouth: Mucous membranes are moist.     Pharynx: Oropharynx is clear.  Eyes:     Conjunctiva/sclera: Conjunctivae normal.     Funduscopic exam:    Right eye: No hemorrhage, exudate or papilledema.        Left eye: No hemorrhage, exudate or papilledema.  Cardiovascular:     Rate and Rhythm: Normal rate and regular rhythm.     Heart sounds: No murmur heard. Pulmonary:     Effort: Pulmonary effort is normal. No respiratory distress.     Breath sounds: Normal breath sounds.  Musculoskeletal:     Cervical back: Spasms, tenderness and crepitus present. Decreased range of motion (Minimally).       Back:  Lymphadenopathy:     Head:     Left side of head: Posterior auricular adenopathy present.     Cervical: Cervical adenopathy present.     Left cervical: Superficial cervical adenopathy present.  Skin:    General: Skin is warm.     Findings: No erythema or rash.  Neurological:     General: No focal deficit present.     Mental Status: She is alert and oriented to person, place, and time.     Cranial Nerves: No  cranial nerve deficit.     Gait: Gait normal.     Deep Tendon Reflexes:     Reflex Scores:      Bicep reflexes are 2+ on the right side  and 2+ on the left side.      Patellar reflexes are 2+ on the right side and 2+ on the left side. Psychiatric:        Mood and Affect: Affect normal. Mood is anxious.     Comments: Well groomed, good eye contact.   ASSESSMENT AND PLAN: New onset headache -     MR BRAIN W WO CONTRAST; Future  Cervicalgia -     tiZANidine HCl; Take 0.5-1 tablets (2-4 mg total) by mouth at bedtime for 21 days.  Dispense: 21 tablet; Refill: 0 -     DG Cervical Spine Complete; Future    Return if symptoms worsen or fail to improve, for keep next appointment.  Aalayah Riles G. [name], MD  [facility]. [place] office.

## 2022-02-22 NOTE — Patient Instructions (Addendum)
A few things to remember from today's visit:  New onset headache - Plan: MR Brain W Wo Contrast  Cervicalgia - Plan: tiZANidine (ZANAFLEX) 4 MG tablet, DG Cervical Spine Complete  Try Zanaflex 1/2 to 1 tab at bedtime for 14-21 days. We could try PT if imaging is negative. Brain MRI is being arranged.  If you need refills for medications you take chronically, please call your pharmacy. Do not use My Chart to request refills or for acute issues that need immediate attention. If you send a my chart message, it may take a few days to be addressed, specially if I am not in the office.  Please be sure medication list is accurate. If a new problem present, please set up appointment sooner than planned today.

## 2022-02-25 ENCOUNTER — Ambulatory Visit (HOSPITAL_BASED_OUTPATIENT_CLINIC_OR_DEPARTMENT_OTHER)
Admission: RE | Admit: 2022-02-25 | Discharge: 2022-02-25 | Disposition: A | Discharge status: Home / Self Care | Payer: PPO | Source: Ambulatory Visit | Attending: Family Medicine | Admitting: Family Medicine

## 2022-02-25 DIAGNOSIS — Z853 Personal history of malignant neoplasm of breast: Secondary | ICD-10-CM | POA: Insufficient documentation

## 2022-02-25 DIAGNOSIS — G319 Degenerative disease of nervous system, unspecified: Secondary | ICD-10-CM | POA: Diagnosis not present

## 2022-02-25 DIAGNOSIS — R519 Headache, unspecified: Secondary | ICD-10-CM | POA: Insufficient documentation

## 2022-02-25 DIAGNOSIS — M542 Cervicalgia: Secondary | ICD-10-CM | POA: Insufficient documentation

## 2022-02-25 DIAGNOSIS — I6782 Cerebral ischemia: Secondary | ICD-10-CM | POA: Insufficient documentation

## 2022-02-25 MED ORDER — GADOBUTROL 1 MMOL/ML IV SOLN
6.2000 mL | Freq: Once | INTRAVENOUS | Status: AC | PRN
  Administered 2022-02-25: 6.2 mL via INTRAVENOUS
  Filled 2022-02-25: qty 7.5

## 2022-03-05 ENCOUNTER — Other Ambulatory Visit: Payer: PPO

## 2022-03-07 ENCOUNTER — Ambulatory Visit (HOSPITAL_BASED_OUTPATIENT_CLINIC_OR_DEPARTMENT_OTHER)
Admission: RE | Admit: 2022-03-07 | Discharge: 2022-03-07 | Disposition: A | Discharge status: Home / Self Care | Payer: PPO | Source: Ambulatory Visit | Attending: Family Medicine | Admitting: Family Medicine

## 2022-03-07 DIAGNOSIS — R590 Localized enlarged lymph nodes: Secondary | ICD-10-CM | POA: Insufficient documentation

## 2022-03-07 DIAGNOSIS — R221 Localized swelling, mass and lump, neck: Secondary | ICD-10-CM | POA: Diagnosis not present

## 2022-03-07 MED ORDER — IOHEXOL 300 MG/ML  SOLN
100.0000 mL | Freq: Once | INTRAMUSCULAR | Status: AC | PRN
  Administered 2022-03-07: 75 mL via INTRAVENOUS

## 2022-03-09 ENCOUNTER — Encounter: Payer: Self-pay | Admitting: Family Medicine

## 2022-03-09 DIAGNOSIS — M542 Cervicalgia: Secondary | ICD-10-CM

## 2022-03-12 ENCOUNTER — Ambulatory Visit (HOSPITAL_BASED_OUTPATIENT_CLINIC_OR_DEPARTMENT_OTHER): Payer: PPO

## 2022-03-15 ENCOUNTER — Telehealth: Payer: Self-pay | Admitting: Family Medicine

## 2022-03-15 DIAGNOSIS — M542 Cervicalgia: Secondary | ICD-10-CM

## 2022-03-15 NOTE — Telephone Encounter (Signed)
Referral placed to [facility].

## 2022-03-15 NOTE — Telephone Encounter (Signed)
Re:  Referral  Pt called to inform MD that [facility] cannot see her until April   Pt would like a new referral to go to:   [name]  Fax:  [phone]  Please advise.

## 2022-03-17 DIAGNOSIS — L814 Other melanin hyperpigmentation: Secondary | ICD-10-CM | POA: Diagnosis not present

## 2022-03-17 DIAGNOSIS — D2261 Melanocytic nevi of right upper limb, including shoulder: Secondary | ICD-10-CM | POA: Diagnosis not present

## 2022-03-17 DIAGNOSIS — L578 Other skin changes due to chronic exposure to nonionizing radiation: Secondary | ICD-10-CM | POA: Diagnosis not present

## 2022-03-17 DIAGNOSIS — D225 Melanocytic nevi of trunk: Secondary | ICD-10-CM | POA: Diagnosis not present

## 2022-03-17 DIAGNOSIS — L821 Other seborrheic keratosis: Secondary | ICD-10-CM | POA: Diagnosis not present

## 2022-03-17 DIAGNOSIS — Z86018 Personal history of other benign neoplasm: Secondary | ICD-10-CM | POA: Diagnosis not present

## 2022-03-17 DIAGNOSIS — D239 Other benign neoplasm of skin, unspecified: Secondary | ICD-10-CM | POA: Diagnosis not present

## 2022-03-22 ENCOUNTER — Telehealth: Payer: Self-pay | Admitting: Family Medicine

## 2022-03-22 NOTE — Telephone Encounter (Signed)
Called patient to schedule Medicare Annual Wellness Visit (AWV). Left message for patient to call back and schedule Medicare Annual Wellness Visit (AWV).  Last date of AWV: 08/12/20  Please schedule an appointment at any time with [facility] or [facility].  If any questions, please contact me at [phone].  Thank you ,  Barkley Boards AWV direct phone # [phone]

## 2022-03-25 DIAGNOSIS — M47812 Spondylosis without myelopathy or radiculopathy, cervical region: Secondary | ICD-10-CM | POA: Diagnosis not present

## 2022-03-29 ENCOUNTER — Ambulatory Visit
Admission: RE | Admit: 2022-03-29 | Discharge: 2022-03-29 | Disposition: A | Discharge status: Home / Self Care | Payer: PPO | Source: Ambulatory Visit | Attending: Obstetrics & Gynecology | Admitting: Obstetrics & Gynecology

## 2022-03-29 DIAGNOSIS — Z1231 Encounter for screening mammogram for malignant neoplasm of breast: Secondary | ICD-10-CM

## 2022-03-31 ENCOUNTER — Other Ambulatory Visit: Payer: Self-pay | Admitting: Obstetrics & Gynecology

## 2022-03-31 DIAGNOSIS — M47812 Spondylosis without myelopathy or radiculopathy, cervical region: Secondary | ICD-10-CM | POA: Diagnosis not present

## 2022-03-31 DIAGNOSIS — R928 Other abnormal and inconclusive findings on diagnostic imaging of breast: Secondary | ICD-10-CM

## 2022-04-02 ENCOUNTER — Ambulatory Visit
Admission: RE | Admit: 2022-04-02 | Discharge: 2022-04-02 | Disposition: A | Discharge status: Home / Self Care | Payer: PPO | Source: Ambulatory Visit | Attending: Obstetrics & Gynecology | Admitting: Obstetrics & Gynecology

## 2022-04-02 DIAGNOSIS — R928 Other abnormal and inconclusive findings on diagnostic imaging of breast: Secondary | ICD-10-CM

## 2022-04-02 DIAGNOSIS — R921 Mammographic calcification found on diagnostic imaging of breast: Secondary | ICD-10-CM | POA: Diagnosis not present

## 2022-04-03 ENCOUNTER — Other Ambulatory Visit: Payer: Self-pay | Admitting: Obstetrics & Gynecology

## 2022-04-03 DIAGNOSIS — R921 Mammographic calcification found on diagnostic imaging of breast: Secondary | ICD-10-CM

## 2022-04-05 ENCOUNTER — Encounter: Payer: Self-pay | Admitting: Obstetrics & Gynecology

## 2022-04-06 DIAGNOSIS — M47812 Spondylosis without myelopathy or radiculopathy, cervical region: Secondary | ICD-10-CM | POA: Diagnosis not present

## 2022-04-08 ENCOUNTER — Ambulatory Visit
Admission: RE | Admit: 2022-04-08 | Discharge: 2022-04-08 | Disposition: A | Discharge status: Home / Self Care | Payer: PPO | Source: Ambulatory Visit | Attending: Obstetrics & Gynecology | Admitting: Obstetrics & Gynecology

## 2022-04-08 DIAGNOSIS — R921 Mammographic calcification found on diagnostic imaging of breast: Secondary | ICD-10-CM

## 2022-04-08 DIAGNOSIS — N6021 Fibroadenosis of right breast: Secondary | ICD-10-CM | POA: Diagnosis not present

## 2022-04-08 DIAGNOSIS — M47812 Spondylosis without myelopathy or radiculopathy, cervical region: Secondary | ICD-10-CM | POA: Diagnosis not present

## 2022-04-08 HISTORY — PX: BREAST BIOPSY: SHX20

## 2022-04-12 DIAGNOSIS — M47812 Spondylosis without myelopathy or radiculopathy, cervical region: Secondary | ICD-10-CM | POA: Diagnosis not present

## 2022-04-12 NOTE — Progress Notes (Signed)
Chief Complaint  Patient presents with   Headache    Follow up - not any better, constant.    HPI: [name] is a 72 y.o. female with PMHx significant for Aortic atherosclerosis,tobacco use disorder,COPD, OA,and hx of breat cancer here today to follow on headaches.  She was last seen on 02/22/22 for headaches.  persistent headaches since February/2024, which have either remained the same, no new associated symptom.  The headaches are described as pounding in the temples and occur daily. She has been taking ibuprofen at night to manage the pain and sleep through the night. Physical therapy for neck pain has not provided relief for the headaches. There is no associated photophobia, nausea or vomiting.  Brain MRI on 02/25/22 1.  No evidence of an acute intracranial abnormality. 2. Minimal chronic small-vessel ischemic changes within the bilateral frontal lobe white matter. 3. Mild generalized cerebral atrophy  She also reports recent bilateral TMJ pain when opening and chewing food. She has followed with her dentist and has been told it is not TMJ synd. She experiences a sensation of a "thick" sensation in her throat when swallowing and sore throat x 1-2 weeks, no clear post nasal drainage, no stridor,or dysphagia. She has not noted lesions in her mouth. + Smoker. Also tender areas around her neck, not longer having enlarged lymph nodes. Negative for fever,chills,abnormal wt loss,or night sweats.  Neck soft tissue CT on 03/08/22:  1. No abnormality seen to explain the clinical presentation. Specific attention to the left retro auricular region does not show any evidence of lymph nodes or other soft tissue masses. 2. Emphysema. Mild patchy scarring in the right upper lobe, unchanged since August of 2023. 3. 8 mm nodule right lobe of the thyroid. No follow-up recommended  Component     Latest Ref Rng 02/08/2022  WBC     4.0 - 10.5 K/uL 5.9   RBC     3.87 - 5.11 Mil/uL 4.38    Hemoglobin     12.0 - 15.0 g/dL 16.1 (H)   HCT     09.6 - 46.0 % 43.6   MCV     78.0 - 100.0 fl 99.4   MCH     26.0 - 34.0 pg   MCHC     30.0 - 36.0 g/dL 04.5   RDW     40.9 - 81.1 % 12.7   Platelets     150.0 - 400.0 K/uL 213.0   nRBC     0.0 - 0.2 %   Neutrophils     43.0 - 77.0 % 63.6   NEUT#     1.4 - 7.7 K/uL 3.7   Lymphocytes     12.0 - 46.0 % 25.4   Lymphocyte #     0.7 - 4.0 K/uL 1.5   Monocytes Relative     3.0 - 12.0 % 8.9   Monocyte #     0.1 - 1.0 K/uL 0.5   Eosinophil     0.0 - 5.0 % 1.5   Eosinophils Absolute     0.0 - 0.7 K/uL 0.1   Basophil     0.0 - 3.0 % 0.6   Basophils Absolute     0.0 - 0.1 K/uL 0.0   Immature Granulocytes     %   Abs Immature Granulocytes     0.00 - 0.07 K/uL     Additionally, she experiences pain in the right shoulder area. She denies hip pain and is able  to brush her hair without difficulty.  Furthermore, she recently underwent a breast biopsy due to an abnormal mammogram, which according to pt, revealed benign but atypical findings. A consultation with a breast surgeon has been arranged.  Review of Systems  Constitutional:  Negative for chills and fever.  Respiratory:  Negative for shortness of breath and wheezing.   Cardiovascular:  Negative for chest pain, palpitations and leg swelling.  Gastrointestinal:  Negative for abdominal pain.       No changes in bowel habits.  Endocrine: Negative for cold intolerance and heat intolerance.  Genitourinary:  Negative for decreased urine volume, dysuria and hematuria.  Skin:  Negative for rash.  Neurological:  Negative for syncope, facial asymmetry, speech difficulty and weakness.  Psychiatric/Behavioral:  Positive for sleep disturbance. Negative for confusion. The patient is nervous/anxious.   See other pertinent positives and negatives in HPI.  Current Outpatient Medications on File Prior to Visit  Medication Sig Dispense Refill   aspirin EC 81 MG tablet Take 81 mg by  mouth daily. Swallow whole.     Calcium Carbonate-Vitamin D 600-400 MG-UNIT tablet Take 1 tablet by mouth in the morning and at bedtime.     carboxymethylcellulose (REFRESH PLUS) 0.5 % SOLN Place 1 drop into both eyes 3 (three) times daily as needed (tired/dry/irritated eyes.).     cholecalciferol (VITAMIN D3) 25 MCG (1000 UNIT) tablet Take 1,000 Units by mouth in the morning and at bedtime.     Ipratropium-Albuterol (COMBIVENT RESPIMAT) 20-100 MCG/ACT AERS respimat Inhale 1 puff into the lungs every 6 (six) hours as needed for wheezing. 4 g 3   Misc Natural Products (OSTEO BI-FLEX TRIPLE STRENGTH) TABS Take 1 tablet by mouth 2 (two) times daily.     Multiple Vitamin (MULTIVITAMIN WITH MINERALS) TABS tablet Take 1 tablet by mouth daily with breakfast.     simvastatin (ZOCOR) 20 MG tablet Take 1 tablet (20 mg total) by mouth at bedtime. 90 tablet 3   No current facility-administered medications on file prior to visit.   Past Medical History:  Diagnosis Date   Arthritis    left knee and hip   Breast cancer    Radiation and lumpectomy  mastectomy and chemo   COPD, mild    Diverticulosis    Family history of breast cancer    Family history of uterine cancer    Hyperlipidemia    Internal hemorrhoids    Osteopenia    DEXA 10/27/11: -1.4, no change from 10/2009   PONV (postoperative nausea and vomiting)    Severe   Shingles    Smoker    Allergies  Allergen Reactions   Tetanus Toxoid Other (See Comments)    shock   Codeine Rash    Social History   Socioeconomic History   Marital status: Widowed    Spouse name: Not on file   Number of children: 0   Years of education: Not on file   Highest education level: Bachelor's degree (e.g., BA, AB, BS)  Occupational History   Not on file  Tobacco Use   Smoking status: Every Day    Packs/day: 1.00    Years: 49.00    Additional pack years: 0.00    Total pack years: 49.00    Types: Cigarettes   Smokeless tobacco: Never   Tobacco  comments:    Trying to smoke less.   Vaping Use   Vaping Use: Never used  Substance and Sexual Activity   Alcohol use: Not Currently  Comment: daily   Drug use: No   Sexual activity: Not Currently    Partners: Male    Birth control/protection: Post-menopausal    Comment: older than 50, more than 5  Other Topics Concern   Not on file  Social History Narrative   Lives at home with husband who is disabled.     Social Determinants of Health   Financial Resource Strain: Low Risk  (02/08/2022)   Overall Financial Resource Strain (CARDIA)    Difficulty of Paying Living Expenses: Not hard at all  Food Insecurity: No Food Insecurity (02/08/2022)   Hunger Vital Sign    Worried About Running Out of Food in the Last Year: Never true    Ran Out of Food in the Last Year: Never true  Transportation Needs: No Transportation Needs (02/08/2022)   PRAPARE - Administrator, Civil Service (Medical): No    Lack of Transportation (Non-Medical): No  Physical Activity: Unknown (02/08/2022)   Exercise Vital Sign    Days of Exercise per Week: 0 days    Minutes of Exercise per Session: Not on file  Stress: No Stress Concern Present (02/08/2022)   [employer] of Occupational Health - Occupational Stress Questionnaire    Feeling of Stress : Not at all  Social Connections: Unknown (02/08/2022)   Social Connection and Isolation Panel [NHANES]    Frequency of Communication with Friends and Family: Patient declined    Frequency of Social Gatherings with Friends and Family: Patient declined    Attends Religious Services: Patient declined    Database administrator or Organizations: Patient declined    Attends Banker Meetings: Not on file    Marital Status: Widowed   Vitals:   04/13/22 1446  BP: 132/70  Pulse: 94  Resp: 16  SpO2: 98%   Body mass index is 23.34 kg/m.  Physical Exam Vitals and nursing note reviewed.  Constitutional:      General: She is not in acute  distress.    Appearance: She is well-developed. She is not ill-appearing.  HENT:     Head: Normocephalic and atraumatic.     Jaw: Tenderness present. No trismus or swelling.     Comments: Very tender upon palpation of areas of scalp and specially bitemporal and TMJ. No crepitus.    Right Ear: Tympanic membrane, ear canal and external ear normal.     Left Ear: Tympanic membrane, ear canal and external ear normal.     Nose:     Right Turbinates: Enlarged.     Left Turbinates: Enlarged.     Mouth/Throat:     Mouth: Mucous membranes are moist. No oral lesions.     Pharynx: No pharyngeal swelling, oropharyngeal exudate or posterior oropharyngeal erythema.  Eyes:     Extraocular Movements: Extraocular movements intact.     Conjunctiva/sclera: Conjunctivae normal.     Pupils: Pupils are equal, round, and reactive to light.  Cardiovascular:     Rate and Rhythm: Normal rate and regular rhythm.     Heart sounds: No murmur heard. Pulmonary:     Effort: Pulmonary effort is normal. No respiratory distress.     Breath sounds: Normal breath sounds. No stridor.  Musculoskeletal:     Cervical back: No edema or erythema. No muscular tenderness.  Lymphadenopathy:     Head:     Right side of head: No submandibular adenopathy.     Left side of head: No submandibular adenopathy.     Cervical:  No cervical adenopathy.  Skin:    General: Skin is warm.     Findings: No erythema or rash.  Neurological:     General: No focal deficit present.     Mental Status: She is alert and oriented to person, place, and time.     Cranial Nerves: No cranial nerve deficit.     Gait: Gait normal.  Psychiatric:        Mood and Affect: Affect normal. Mood is anxious.   ASSESSMENT AND PLAN:  Ms. Britta MccreedyBarbara was seen today for headache.  Diagnoses and all orders for this visit: Lab Results  Component Value Date   WBC 8.8 04/13/2022   HGB 13.4 04/13/2022   HCT 39.6 04/13/2022   MCV 98.0 04/13/2022   PLT 344.0  04/13/2022   Lab Results  Component Value Date   CRP 7.8 04/13/2022   Lab Results  Component Value Date   ESRSEDRATE 82 (H) 04/13/2022   Headache, unspecified headache type Assessment & Plan: Having severe headache since 02/2022. Brain MRI negative otherwise. We discussed again possible etiologies, headache has been mostly occipital and some parietal but today she is reporting severe bitemporal headache. Can be aggravated by TMJ pain. Very sensitive around this area with light touch. ? Tension headache, she agrees with starting Amitriptyline, we are starting with low dose, 5 mg at night then increase to 10 mg in 10 days if well tolerated. We discussed side effects. Temporal arteritis is to be considered. Today CRP and sed rate ordered.  Clearly instructed about warning signs. F/U in 4 weeks, before if needed. Neuro referral will be considered if problem is persistent.  Orders: -     C-reactive protein; Future -     Sedimentation rate; Future -     CBC; Future -     Amitriptyline HCl; Take 1 tablet (10 mg total) by mouth at bedtime.  Dispense: 30 tablet; Refill: 1  Bilateral temporomandibular joint pain Assessment & Plan: Very tender with palpation, no crepitus or limitation of ROM. We discussed possible etiologies. She has had this problem addressed by her dentist. ? OA. General recommendations given on AVS.   Submandibular gland tenderness Assessment & Plan: Not longer enlarged. Soft tissue neck CT done in 03/2022 was negative for suspicious lesions. Very tender upon light touch and reporting sore trhoat for 2 weeks. I do not appreciate abnormalities on examination today, we discussed the option of ENT evaluation but we did not make a decision today. Monitor for new symptoms.   Return in about 4 weeks (around 05/11/2022).  Cherity Blickenstaff G. [place], MD  [facility]. [place] office.

## 2022-04-13 ENCOUNTER — Encounter: Payer: Self-pay | Admitting: Family Medicine

## 2022-04-13 ENCOUNTER — Ambulatory Visit (INDEPENDENT_AMBULATORY_CARE_PROVIDER_SITE_OTHER): Payer: PPO | Admitting: Family Medicine

## 2022-04-13 VITALS — BP 132/70 | HR 94 | Resp 16 | Ht 64.5 in | Wt 138.1 lb

## 2022-04-13 DIAGNOSIS — K118 Other diseases of salivary glands: Secondary | ICD-10-CM | POA: Insufficient documentation

## 2022-04-13 DIAGNOSIS — M26623 Arthralgia of bilateral temporomandibular joint: Secondary | ICD-10-CM | POA: Insufficient documentation

## 2022-04-13 DIAGNOSIS — R519 Headache, unspecified: Secondary | ICD-10-CM | POA: Insufficient documentation

## 2022-04-13 DIAGNOSIS — J029 Acute pharyngitis, unspecified: Secondary | ICD-10-CM

## 2022-04-13 MED ORDER — AMITRIPTYLINE HCL 10 MG PO TABS
10.0000 mg | ORAL_TABLET | Freq: Every day | ORAL | 1 refills | Status: DC
Start: 2022-04-13 — End: 2022-07-26

## 2022-04-13 NOTE — Patient Instructions (Addendum)
A few things to remember from today's visit:  Headache, unspecified headache type - Plan: C-reactive protein, Sedimentation rate, CBC  Bilateral temporomandibular joint pain  Submandibular gland tenderness  ENT evaluation can be arrange, let me know what you decide. Amitriptyline 10 mg, start 1/2 tab and increase to 1 tab in 10 days, at bedtime. Inflammatory markers will be checked today.  Temporomandibular joint pain could be arthritis or TMJ synd.  If you need refills for medications you take chronically, please call your pharmacy. Do not use My Chart to request refills or for acute issues that need immediate attention. If you send a my chart message, it may take a few days to be addressed, specially if I am not in the office.  Please be sure medication list is accurate. If a new problem present, please set up appointment sooner than planned today.

## 2022-04-14 LAB — CBC
HCT: 39.6 % (ref 36.0–46.0)
Hemoglobin: 13.4 g/dL (ref 12.0–15.0)
MCHC: 34 g/dL (ref 30.0–36.0)
MCV: 98 fl (ref 78.0–100.0)
Platelets: 344 10*3/uL (ref 150.0–400.0)
RBC: 4.04 Mil/uL (ref 3.87–5.11)
RDW: 12.4 % (ref 11.5–15.5)
WBC: 8.8 10*3/uL (ref 4.0–10.5)

## 2022-04-14 LAB — C-REACTIVE PROTEIN: CRP: 7.8 mg/dL (ref 0.5–20.0)

## 2022-04-14 LAB — SEDIMENTATION RATE: Sed Rate: 82 mm/hr — ABNORMAL HIGH (ref 0–30)

## 2022-04-15 DIAGNOSIS — M47812 Spondylosis without myelopathy or radiculopathy, cervical region: Secondary | ICD-10-CM | POA: Diagnosis not present

## 2022-04-16 ENCOUNTER — Encounter: Payer: Self-pay | Admitting: Family Medicine

## 2022-04-17 NOTE — Assessment & Plan Note (Signed)
Very tender with palpation, no crepitus or limitation of ROM. We discussed possible etiologies. She has had this problem addressed by her dentist. ? OA. General recommendations given on AVS.

## 2022-04-17 NOTE — Assessment & Plan Note (Signed)
Having severe headache since 02/2022. Brain MRI negative otherwise. We discussed again possible etiologies, headache has been mostly occipital and some parietal but today she is reporting severe bitemporal headache. Can be aggravated by TMJ pain. Very sensitive around this area with light touch. ? Tension headache, she agrees with starting Amitriptyline, we are starting with low dose, 5 mg at night then increase to 10 mg in 10 days if well tolerated. We discussed side effects. Temporal arteritis is to be considered. Today CRP and sed rate ordered.  Clearly instructed about warning signs. F/U in 4 weeks, before if needed. Neuro referral will be considered if problem is persistent.

## 2022-04-17 NOTE — Assessment & Plan Note (Addendum)
Not longer enlarged. Soft tissue neck CT done in 03/2022 was negative for suspicious lesions. Very tender upon light touch and reporting sore trhoat for 2 weeks. I do not appreciate abnormalities on examination today, we discussed the option of ENT evaluation but we did not make a decision today. Monitor for new symptoms.

## 2022-04-20 ENCOUNTER — Telehealth: Payer: Self-pay | Admitting: Family Medicine

## 2022-04-20 DIAGNOSIS — M47812 Spondylosis without myelopathy or radiculopathy, cervical region: Secondary | ICD-10-CM | POA: Diagnosis not present

## 2022-04-20 NOTE — Telephone Encounter (Signed)
Called patient to schedule Medicare Annual Wellness Visit (AWV). Left message for patient to call back and schedule Medicare Annual Wellness Visit (AWV).  Last date of AWV: 08/12/20  Please schedule an appointment at any time with [facility] or [facility].  If any questions, please contact me at [phone].  Thank you ,  Rudell Cobb AWV direct phone # [phone]

## 2022-04-22 DIAGNOSIS — M47812 Spondylosis without myelopathy or radiculopathy, cervical region: Secondary | ICD-10-CM | POA: Diagnosis not present

## 2022-04-26 DIAGNOSIS — M47812 Spondylosis without myelopathy or radiculopathy, cervical region: Secondary | ICD-10-CM | POA: Diagnosis not present

## 2022-05-04 ENCOUNTER — Encounter: Payer: Self-pay | Admitting: Family Medicine

## 2022-05-07 ENCOUNTER — Other Ambulatory Visit: Payer: Self-pay | Admitting: Family Medicine

## 2022-05-07 DIAGNOSIS — E785 Hyperlipidemia, unspecified: Secondary | ICD-10-CM

## 2022-05-07 MED ORDER — ATORVASTATIN CALCIUM 40 MG PO TABS
40.0000 mg | ORAL_TABLET | Freq: Every day | ORAL | 3 refills | Status: DC
Start: 2022-05-07 — End: 2023-06-06

## 2022-05-07 NOTE — Telephone Encounter (Signed)
Checking on status of this request. Says she has 3 pills left and will run out Sunday

## 2022-05-10 NOTE — Progress Notes (Unsigned)
HPI: Tanya [name] is a 72 y.o. female, who is here today to follow on recent visit. She was last seen on 04/13/22 for persistent headache. She has had severe headaches since 02/2022.  Last visit Amitriptyline 10 mg was started, in general she has tolerated medication well.  Inquiring about the risks on for which medication was added,listed as antidepressant.  She also started neck PT and dry needling therapy, the latter one she believes helped the most. Headache has resolved and neck pain has greatly improved. Stills having left TMJ weak feeling with prolonged chewing but not longer having pain.  Brain MRI on 02/25/22 negative for acute intracranial abnormality.  Today she is c/o joint pain, reported as new. Arthralgias particularly in her right shoulder, some in left, exacerbated by certain movements like lifting it sideways and abduction, some limitation of ROM. Knee pain,R>L, which she describes as feeling swollen but has not noted edema or erythema, + stiffness. I This knee pain exacerbated by squatting.  She notes that these symptoms appeared suddenly this spring and are concerning to her. No history of trauma or unusual physical activity.  She has a history of hip surgery dated October 22.  She does not report taking any pain medication for her new joint symptoms.   Lab Results  Component Value Date   ESRSEDRATE 82 (H) 04/13/2022   Lab Results  Component Value Date   CRP 7.8 04/13/2022   Lab Results  Component Value Date   WBC 8.8 04/13/2022   HGB 13.4 04/13/2022   HCT 39.6 04/13/2022   MCV 98.0 04/13/2022   PLT 344.0 04/13/2022   Hyperlipidemia and aortic atherosclerosis: Recently recommend changing simvastatin 20 mg for atorvastatin 40 mg, she is inquiring about possible side effects, concerned about possibility of liver issues with the new medication. She has tolerated atorvastatin well, no side effects reported. She continues smoking.  Lab Results  Component  Value Date   CHOL 202 (H) 07/20/2021   HDL 83.80 07/20/2021   LDLCALC 98 07/20/2021   LDLDIRECT 127.4 03/13/2012   TRIG 100.0 07/20/2021   CHOLHDL 2 07/20/2021   Lab Results  Component Value Date   ALT 15 07/20/2021   AST 20 07/20/2021   ALKPHOS 48 07/20/2021   BILITOT 0.5 07/20/2021   Review of Systems  Constitutional:  Negative for appetite change, chills and fever.  HENT:  Negative for mouth sores, sore throat and trouble swallowing.   Respiratory:  Negative for cough, shortness of breath and wheezing.   Cardiovascular:  Negative for chest pain and palpitations.  Genitourinary:  Negative for decreased urine volume, dysuria and hematuria.  Skin:  Negative for rash.  Neurological:  Negative for syncope, facial asymmetry and weakness.  See other pertinent positives and negatives in HPI.  Current Outpatient Medications on File Prior to Visit  Medication Sig Dispense Refill   amitriptyline (ELAVIL) 10 MG tablet Take 1 tablet (10 mg total) by mouth at bedtime. 30 tablet 1   aspirin EC 81 MG tablet Take 81 mg by mouth daily. Swallow whole.     atorvastatin (LIPITOR) 40 MG tablet Take 1 tablet (40 mg total) by mouth daily. 90 tablet 3   Calcium Carbonate-Vitamin D 600-400 MG-UNIT tablet Take 1 tablet by mouth in the morning and at bedtime.     carboxymethylcellulose (REFRESH PLUS) 0.5 % SOLN Place 1 drop into both eyes 3 (three) times daily as needed (tired/dry/irritated eyes.).     cholecalciferol (VITAMIN D3) 25 MCG (1000 UNIT) tablet  Take 1,000 Units by mouth in the morning and at bedtime.     Ipratropium-Albuterol (COMBIVENT RESPIMAT) 20-100 MCG/ACT AERS respimat Inhale 1 puff into the lungs every 6 (six) hours as needed for wheezing. 4 g 3   Misc Natural Products (OSTEO BI-FLEX TRIPLE STRENGTH) TABS Take 1 tablet by mouth 2 (two) times daily.     Multiple Vitamin (MULTIVITAMIN WITH MINERALS) TABS tablet Take 1 tablet by mouth daily with breakfast.     No current  facility-administered medications on file prior to visit.    Past Medical History:  Diagnosis Date   Arthritis    left knee and hip   Breast cancer (HCC)    Radiation and lumpectomy  mastectomy and chemo   COPD, mild (HCC)    Diverticulosis    Family history of breast cancer    Family history of uterine cancer    Hyperlipidemia    Internal hemorrhoids    Osteopenia    DEXA 10/27/11: -1.4, no change from 10/2009   PONV (postoperative nausea and vomiting)    Severe   Shingles    Smoker    Allergies  Allergen Reactions   Tetanus Toxoid Other (See Comments)    shock   Codeine Rash    Social History   Socioeconomic History   Marital status: Widowed    Spouse name: Not on file   Number of children: 0   Years of education: Not on file   Highest education level: Bachelor's degree (e.g., BA, AB, BS)  Occupational History   Not on file  Tobacco Use   Smoking status: Every Day    Packs/day: 1.00    Years: 49.00    Additional pack years: 0.00    Total pack years: 49.00    Types: Cigarettes   Smokeless tobacco: Never   Tobacco comments:    Trying to smoke less.   Vaping Use   Vaping Use: Never used  Substance and Sexual Activity   Alcohol use: Not Currently    Comment: daily   Drug use: No   Sexual activity: Not Currently    Partners: Male    Birth control/protection: Post-menopausal    Comment: older than 26, more than 5  Other Topics Concern   Not on file  Social History Narrative   Lives at home with husband who is disabled.     Social Determinants of Health   Financial Resource Strain: Low Risk  (02/08/2022)   Overall Financial Resource Strain (CARDIA)    Difficulty of Paying Living Expenses: Not hard at all  Food Insecurity: No Food Insecurity (02/08/2022)   Hunger Vital Sign    Worried About Running Out of Food in the Last Year: Never true    Ran Out of Food in the Last Year: Never true  Transportation Needs: No Transportation Needs (02/08/2022)   PRAPARE  - Administrator, Civil Service (Medical): No    Lack of Transportation (Non-Medical): No  Physical Activity: Unknown (02/08/2022)   Exercise Vital Sign    Days of Exercise per Week: 0 days    Minutes of Exercise per Session: Not on file  Stress: No Stress Concern Present (02/08/2022)   [employer] of Occupational Health - Occupational Stress Questionnaire    Feeling of Stress : Not at all  Social Connections: Unknown (02/08/2022)   Social Connection and Isolation Panel [NHANES]    Frequency of Communication with Friends and Family: Patient declined    Frequency of Social Gatherings with  Friends and Family: Patient declined    Attends Religious Services: Patient declined    Active Member of Clubs or Organizations: Patient declined    Attends Banker Meetings: Not on file    Marital Status: Widowed   Family History  Problem Relation Age of Onset   CAD Mother        stent older age   Hypertension Mother    Breast cancer Mother 59   Cancer Mother        SKIN and BLADDER    Hypertension Father    Heart disease Father 58       CHF    Dementia Father    Heart disease Brother        Visual merchandiser   Hearing loss Maternal Grandfather    Parkinson's disease Paternal Grandmother    Heart disease Paternal Grandfather    Lung cancer Paternal Grandfather     Vitals:   05/11/22 0917  BP: 128/70  Pulse: 100  Resp: 16  Temp: 98.5 F (36.9 C)  SpO2: 98%   Wt Readings from Last 3 Encounters:  05/11/22 134 lb 8 oz (61 kg)  04/13/22 138 lb 2 oz (62.7 kg)  02/22/22 138 lb (62.6 kg)   Body mass index is 22.73 kg/m.  Physical Exam Vitals and nursing note reviewed.  Constitutional:      General: She is not in acute distress.    Appearance: She is well-developed.  HENT:     Head: Normocephalic and atraumatic.     Mouth/Throat:     Mouth: Mucous membranes are moist.  Eyes:     Conjunctiva/sclera: Conjunctivae normal.  Cardiovascular:     Rate and Rhythm:  Normal rate and regular rhythm.     Pulses:          Dorsalis pedis pulses are 2+ on the right side and 2+ on the left side.     Heart sounds: No murmur heard. Pulmonary:     Effort: Pulmonary effort is normal. No respiratory distress.     Breath sounds: Normal breath sounds.  Abdominal:     Palpations: Abdomen is soft. There is no hepatomegaly or mass.     Tenderness: There is no abdominal tenderness.  Musculoskeletal:     Right knee: Crepitus present. Decreased range of motion. Tenderness present.     Left knee: Crepitus present. Decreased range of motion. Tenderness present.     Comments: Right shoulder: Hawkins' test pos, drop arm rotator cuff test pos, empty can supraspinatus test pos, lift-Off Subscapularis test positive. Test negative for left shoulder. ROM mildly limited active abduction, bilateral..   Lymphadenopathy:     Cervical: No cervical adenopathy.  Skin:    General: Skin is warm.     Findings: No erythema or rash.  Neurological:     General: No focal deficit present.     Mental Status: She is alert and oriented to person, place, and time.     Cranial Nerves: No cranial nerve deficit.     Gait: Gait normal.  Psychiatric:        Mood and Affect: Mood and affect normal.   ASSESSMENT AND PLAN:  Tanya [name] was seen today for medical management of chronic issues.  Diagnoses and all orders for this visit: Lab Results  Component Value Date   WBC 8.2 05/11/2022   HGB 13.9 05/11/2022   HCT 40.8 05/11/2022   MCV 96.3 05/11/2022   PLT 352.0 05/11/2022   Lab Results  Component  Value Date   ESRSEDRATE 4 (H) 05/11/2022   Acute pain of right knee We discussed possible etiologies, most likely OA. Because reported as new problem, recommend knee x-ray. She prefers to hold on PT for now. Avoid activities that exacerbate pain.  -     DG Knee Complete 4 Views Right; Future  Impingement syndrome of right shoulder We discussed diagnosis, prognosis, and treatment  options. For now she is not interested in PT but will let me know if she decides to try. ROM exercises. Further recommendation will be given according to shoulder x-ray.  -     DG Shoulder Right; Future  Headache, unspecified headache type Assessment & Plan: Problem has resolved. She has tolerated the amitriptyline well but would like to stop medication. She will continue amitriptyline 10 mg every other day for 2 weeks and then every third day for a week, then she can discontinue.  If headache recurs, she was instructed to resume medication.  Elevated sed rate We discussed possible causes. It is reassured that CRP is normal. Will re-check today and continue following until it is closer to normal.   -     CBC; Future -     C-reactive protein; Future -     Sedimentation rate; Future  Hyperlipidemia, unspecified hyperlipidemia type Assessment & Plan: We discussed some side effects and the impact of the different statin medication on cholesterol as well as LDL goals. Last LDL 98. We want LDL closer to 70, ideally lower for CV prevention. She has tolerated medication well. We will plan on fasting lipid panel and LFTs during her next CPE, 07/2022.  I spent a total of 42 minutes in both face to face and non face to face activities for this visit on the date of this encounter. During this time history was obtained and documented, examination was performed, prior labs/imaging reviewed, and assessment/plan discussed.  Return in about 3 months (around 08/11/2022) for CPE.  Lexxi Koslow G. [place], MD  [facility]. [place] office.

## 2022-05-11 ENCOUNTER — Encounter: Payer: Self-pay | Admitting: Family Medicine

## 2022-05-11 ENCOUNTER — Ambulatory Visit (INDEPENDENT_AMBULATORY_CARE_PROVIDER_SITE_OTHER): Payer: PPO | Admitting: Family Medicine

## 2022-05-11 ENCOUNTER — Ambulatory Visit (INDEPENDENT_AMBULATORY_CARE_PROVIDER_SITE_OTHER): Payer: PPO

## 2022-05-11 VITALS — BP 128/70 | HR 100 | Temp 98.5°F | Resp 16 | Ht 64.5 in | Wt 134.5 lb

## 2022-05-11 DIAGNOSIS — R519 Headache, unspecified: Secondary | ICD-10-CM | POA: Diagnosis not present

## 2022-05-11 DIAGNOSIS — M25511 Pain in right shoulder: Secondary | ICD-10-CM | POA: Diagnosis not present

## 2022-05-11 DIAGNOSIS — M25561 Pain in right knee: Secondary | ICD-10-CM

## 2022-05-11 DIAGNOSIS — M7541 Impingement syndrome of right shoulder: Secondary | ICD-10-CM

## 2022-05-11 DIAGNOSIS — E785 Hyperlipidemia, unspecified: Secondary | ICD-10-CM | POA: Diagnosis not present

## 2022-05-11 DIAGNOSIS — R700 Elevated erythrocyte sedimentation rate: Secondary | ICD-10-CM | POA: Diagnosis not present

## 2022-05-11 LAB — CBC
HCT: 40.8 % (ref 36.0–46.0)
Hemoglobin: 13.9 g/dL (ref 12.0–15.0)
MCHC: 34 g/dL (ref 30.0–36.0)
MCV: 96.3 fl (ref 78.0–100.0)
Platelets: 352 10*3/uL (ref 150.0–400.0)
RBC: 4.24 Mil/uL (ref 3.87–5.11)
RDW: 12.6 % (ref 11.5–15.5)
WBC: 8.2 10*3/uL (ref 4.0–10.5)

## 2022-05-11 LAB — C-REACTIVE PROTEIN: CRP: 1.4 mg/dL (ref 0.5–20.0)

## 2022-05-11 LAB — SEDIMENTATION RATE: Sed Rate: 79 mm/hr — ABNORMAL HIGH (ref 0–30)

## 2022-05-11 NOTE — Patient Instructions (Addendum)
A few things to remember from today's visit:  Impingement syndrome of right shoulder - Plan: DG Shoulder Right  Acute pain of right knee - Plan: DG Knee Complete 4 Views Right  Headache, unspecified headache type  Elevated sed rate - Plan: CBC, C-reactive protein, Sedimentation rate  Let me know if shoulder and knee pain do not improve, we could arrange PT or ortho evaluation.  If you need refills for medications you take chronically, please call your pharmacy. Do not use My Chart to request refills or for acute issues that need immediate attention. If you send a my chart message, it may take a few days to be addressed, specially if I am not in the office.  Please be sure medication list is accurate. If a new problem present, please set up appointment sooner than planned today.

## 2022-05-12 NOTE — Assessment & Plan Note (Signed)
Problem has resolved. She has tolerated the amitriptyline well but would like to stop medication. She will continue amitriptyline 10 mg every other day for 2 weeks and then every third day for a week, then she can discontinue.  If headache recurs, she was instructed to resume medication.

## 2022-05-12 NOTE — Assessment & Plan Note (Addendum)
We discussed some side effects and the impact of the different statin medication on cholesterol as well as LDL goals. Last LDL 98. We want LDL closer to 70, ideally lower for CV prevention. She has tolerated medication well. We will plan on fasting lipid panel and LFTs during her next CPE, 07/2022.

## 2022-05-17 DIAGNOSIS — Z853 Personal history of malignant neoplasm of breast: Secondary | ICD-10-CM | POA: Diagnosis not present

## 2022-05-17 DIAGNOSIS — N6081 Other benign mammary dysplasias of right breast: Secondary | ICD-10-CM | POA: Diagnosis not present

## 2022-06-15 DIAGNOSIS — M25562 Pain in left knee: Secondary | ICD-10-CM | POA: Diagnosis not present

## 2022-06-15 DIAGNOSIS — M25511 Pain in right shoulder: Secondary | ICD-10-CM | POA: Diagnosis not present

## 2022-06-15 DIAGNOSIS — M25512 Pain in left shoulder: Secondary | ICD-10-CM | POA: Diagnosis not present

## 2022-06-15 DIAGNOSIS — M25561 Pain in right knee: Secondary | ICD-10-CM | POA: Diagnosis not present

## 2022-07-26 NOTE — Progress Notes (Unsigned)
HPI: Tanya [name] is a 72 y.o. female, who is here today for her routine physical.  Last CPE: 07/20/2021  Regular exercise 3 or more time per week: *** Following a healthy diet: ***  Chronic medical problems: Aortic atherosclerosis,tobacco use disorder,COPD, OA,and hx of breat cancer among some. Completed 5 years treatment of Tamoxifen and not longer following with oncologist.  Immunization History  Administered Date(s) Administered   COVID-19, mRNA, vaccine(Comirnaty)12 years and older 10/23/2021   Fluad Quad(high Dose 65+) 10/07/2020, 10/09/2021   Influenza Split 10/05/2011   Influenza Whole 09/04/2008   Influenza, High Dose Seasonal PF 10/18/2017, 10/09/2018, 10/09/2019   Influenza,inj,Quad PF,6+ Mos 10/10/2012, 09/18/2013, 12/16/2014, 10/13/2015, 09/15/2016   PFIZER(Purple Top)SARS-COV-2 Vaccination 02/09/2019, 03/07/2019, 10/15/2019   Pfizer Covid-19 Vaccine Bivalent Booster 69yrs & up 04/28/2021   Pneumococcal Conjugate-13 05/02/2017   Pneumococcal Polysaccharide-23 11/04/2008, 08/03/2018   Respiratory Syncytial Virus Vaccine,Recomb Aduvanted(Arexvy) 11/06/2021   Unspecified SARS-COV-2 Vaccination 09/12/2020   Zoster Recombinant(Shingrix) 05/10/2017, 08/03/2017   Zoster, Live 03/17/2012   Health Maintenance  Topic Date Due   Medicare Annual Wellness (AWV)  08/12/2021   COVID-19 Vaccine (7 - 2023-24 season) 08/11/2022 (Originally 12/18/2021)   INFLUENZA VACCINE  08/05/2022   Lung Cancer Screening  09/01/2022   MAMMOGRAM  04/02/2023   Colonoscopy  12/17/2027   Pneumonia Vaccine 65+ Years old  Completed   DEXA SCAN  Completed   Hepatitis C Screening  Completed   Zoster Vaccines- Shingrix  Completed   HPV VACCINES  Aged Out   DTaP/Tdap/Td  Discontinued   DEXA osteopenia, she has not been interested in pharmacologic treatment. She is on Ca++ and Vit D supplementation.   COPD: No changes in chronic cough. Some mild SOB with yard work, has to take breaks sooner  than before. No associated CP or wheezing. She is not using inhalers.   Hyperlipidemia and aortic atherosclerosis: Currently she is on simvastatin 10 mg daily. Lab Results  Component Value Date   CHOL 202 (H) 07/20/2021   HDL 83.80 07/20/2021   LDLCALC 98 07/20/2021   LDLDIRECT 127.4 03/13/2012   TRIG 100.0 07/20/2021   CHOLHDL 2 07/20/2021   Lab Results  Component Value Date   NA 139 07/20/2021   CL 100 07/20/2021   K 4.4 07/20/2021   CO2 31 07/20/2021   BUN 17 07/20/2021   CREATININE 0.67 07/20/2021   GFR 89.18 07/20/2021   CALCIUM 9.9 07/20/2021   ALBUMIN 4.6 07/20/2021   GLUCOSE 97 07/20/2021   Lab Results  Component Value Date   ALT 15 07/20/2021   AST 20 07/20/2021   ALKPHOS 48 07/20/2021   BILITOT 0.5 07/20/2021      Review of Systems  Current Outpatient Medications on File Prior to Visit  Medication Sig Dispense Refill   aspirin EC 81 MG tablet Take 81 mg by mouth daily. Swallow whole.     atorvastatin (LIPITOR) 40 MG tablet Take 1 tablet (40 mg total) by mouth daily. 90 tablet 3   Calcium Carbonate-Vitamin D 600-400 MG-UNIT tablet Take 1 tablet by mouth in the morning and at bedtime.     carboxymethylcellulose (REFRESH PLUS) 0.5 % SOLN Place 1 drop into both eyes 3 (three) times daily as needed (tired/dry/irritated eyes.).     cholecalciferol (VITAMIN D3) 25 MCG (1000 UNIT) tablet Take 1,000 Units by mouth in the morning and at bedtime.     Ipratropium-Albuterol (COMBIVENT RESPIMAT) 20-100 MCG/ACT AERS respimat Inhale 1 puff into the lungs every 6 (six) hours as needed  for wheezing. 4 g 3   Misc Natural Products (OSTEO BI-FLEX TRIPLE STRENGTH) TABS Take 1 tablet by mouth 2 (two) times daily.     Multiple Vitamin (MULTIVITAMIN WITH MINERALS) TABS tablet Take 1 tablet by mouth daily with breakfast.     No current facility-administered medications on file prior to visit.    Past Medical History:  Diagnosis Date   Arthritis    left knee and hip   Breast  cancer (HCC)    Radiation and lumpectomy  mastectomy and chemo   COPD, mild (HCC)    Diverticulosis    Family history of breast cancer    Family history of uterine cancer    Hyperlipidemia    Internal hemorrhoids    Osteopenia    DEXA 10/27/11: -1.4, no change from 10/2009   PONV (postoperative nausea and vomiting)    Severe   Shingles    Smoker     Past Surgical History:  Procedure Laterality Date   BREAST BIOPSY Right    BREAST BIOPSY Right 04/08/2022   MM RT BREAST BX W LOC DEV 1ST LESION IMAGE BX SPEC STEREO GUIDE 04/08/2022 [facility] MAMMO[facility]   BREAST LUMPECTOMY     COLONOSCOPY     DILATION AND EVACUATION     MASTECTOMY Left 08/2014   TONSILLECTOMY     TOTAL HIP ARTHROPLASTY Left 10/28/2020   Procedure: LEFT TOTAL HIP ARTHROPLASTY ANTERIOR APPROACH;  Surgeon: Marcene Corning, MD;  Location: [facility];  Service: Orthopedics;  Laterality: Left;    Allergies  Allergen Reactions   Tetanus Toxoid Other (See Comments)    shock   Codeine Rash    Family History  Problem Relation Age of Onset   CAD Mother        stent older age   Hypertension Mother    Breast cancer Mother 51   Cancer Mother        SKIN and BLADDER    Hypertension Father    Heart disease Father 11       CHF    Dementia Father    Heart disease Brother        pace maker   Hearing loss Maternal Grandfather    Parkinson's disease Paternal Grandmother    Heart disease Paternal Grandfather    Lung cancer Paternal Grandfather     Social History   Socioeconomic History   Marital status: Widowed    Spouse name: Not on file   Number of children: 0   Years of education: Not on file   Highest education level: Bachelor's degree (e.g., BA, AB, BS)  Occupational History   Not on file  Tobacco Use   Smoking status: Every Day    Current packs/day: 1.00    Average packs/day: 1 pack/day for 49.0 years (49.0 ttl pk-yrs)    Types: Cigarettes   Smokeless tobacco: Never   Tobacco comments:    Trying to smoke  less.   Vaping Use   Vaping status: Never Used  Substance and Sexual Activity   Alcohol use: Not Currently    Comment: daily   Drug use: No   Sexual activity: Not Currently    Partners: Male    Birth control/protection: Post-menopausal    Comment: older than 59, more than 5  Other Topics Concern   Not on file  Social History Narrative   Lives at home with husband who is disabled.     Social Determinants of Health   Financial Resource Strain: Low Risk  (02/08/2022)  Overall Financial Resource Strain (CARDIA)    Difficulty of Paying Living Expenses: Not hard at all  Food Insecurity: No Food Insecurity (02/08/2022)   Hunger Vital Sign    Worried About Running Out of Food in the Last Year: Never true    Ran Out of Food in the Last Year: Never true  Transportation Needs: No Transportation Needs (02/08/2022)   PRAPARE - Administrator, Civil Service (Medical): No    Lack of Transportation (Non-Medical): No  Physical Activity: Unknown (02/08/2022)   Exercise Vital Sign    Days of Exercise per Week: 0 days    Minutes of Exercise per Session: Not on file  Stress: No Stress Concern Present (02/08/2022)   [employer] of Occupational Health - Occupational Stress Questionnaire    Feeling of Stress : Not at all  Social Connections: Unknown (02/08/2022)   Social Connection and Isolation Panel [NHANES]    Frequency of Communication with Friends and Family: Patient declined    Frequency of Social Gatherings with Friends and Family: Patient declined    Attends Religious Services: Patient declined    Database administrator or Organizations: Patient declined    Attends Banker Meetings: Not on file    Marital Status: Widowed    There were no vitals filed for this visit. There is no height or weight on file to calculate BMI.  Wt Readings from Last 3 Encounters:  05/11/22 134 lb 8 oz (61 kg)  04/13/22 138 lb 2 oz (62.7 kg)  02/22/22 138 lb (62.6 kg)    Physical  Exam  ASSESSMENT AND PLAN: Ms. KENI WAFER was here today annual physical examination.  No orders of the defined types were placed in this encounter.   Diagnoses and all orders for this visit:  Routine general medical examination at a health care facility  Hyperlipidemia, unspecified hyperlipidemia type  Aortic atherosclerosis (HCC)    Routine general medical examination at a health care facility  Hyperlipidemia, unspecified hyperlipidemia type  Aortic atherosclerosis (HCC)    No follow-ups on file.  Zyiah Withington G. [place], MD  [facility]. [place] office.

## 2022-07-26 NOTE — Patient Instructions (Incomplete)
A few things to remember from today's visit:  Routine general medical examination at a health care facility  Hyperlipidemia, unspecified hyperlipidemia type  Aortic atherosclerosis (HCC)  If you need refills for medications you take chronically, please call your pharmacy. Do not use My Chart to request refills or for acute issues that need immediate attention. If you send a my chart message, it may take a few days to be addressed, specially if I am not in the office.  Please be sure medication list is accurate. If a new problem present, please set up appointment sooner than planned today.

## 2022-07-27 ENCOUNTER — Encounter: Payer: Self-pay | Admitting: Family Medicine

## 2022-07-27 ENCOUNTER — Ambulatory Visit (INDEPENDENT_AMBULATORY_CARE_PROVIDER_SITE_OTHER): Payer: PPO | Admitting: Family Medicine

## 2022-07-27 VITALS — BP 120/72 | HR 100 | Temp 98.8°F | Resp 16 | Ht 64.5 in | Wt 133.0 lb

## 2022-07-27 DIAGNOSIS — M816 Localized osteoporosis [Lequesne]: Secondary | ICD-10-CM | POA: Diagnosis not present

## 2022-07-27 DIAGNOSIS — I700 Atherosclerosis of aorta: Secondary | ICD-10-CM | POA: Diagnosis not present

## 2022-07-27 DIAGNOSIS — R0989 Other specified symptoms and signs involving the circulatory and respiratory systems: Secondary | ICD-10-CM

## 2022-07-27 DIAGNOSIS — J449 Chronic obstructive pulmonary disease, unspecified: Secondary | ICD-10-CM

## 2022-07-27 DIAGNOSIS — Z0000 Encounter for general adult medical examination without abnormal findings: Secondary | ICD-10-CM | POA: Diagnosis not present

## 2022-07-27 DIAGNOSIS — E785 Hyperlipidemia, unspecified: Secondary | ICD-10-CM | POA: Diagnosis not present

## 2022-07-27 DIAGNOSIS — R700 Elevated erythrocyte sedimentation rate: Secondary | ICD-10-CM | POA: Diagnosis not present

## 2022-07-27 LAB — LIPID PANEL
Cholesterol: 189 mg/dL (ref 0–200)
HDL: 88.5 mg/dL (ref >39.00)
LDL Cholesterol: 81 mg/dL (ref 0–99)
NonHDL: 100.81
Total CHOL/HDL Ratio: 2
Triglycerides: 101 mg/dL (ref 0.0–149.0)
VLDL: 20.2 mg/dL (ref 0.0–40.0)

## 2022-07-27 LAB — COMPREHENSIVE METABOLIC PANEL
ALT: 20 U/L (ref 0–35)
AST: 21 U/L (ref 0–37)
Albumin: 4.5 g/dL (ref 3.5–5.2)
Alkaline Phosphatase: 62 U/L (ref 39–117)
BUN: 17 mg/dL (ref 6–23)
CO2: 33 mEq/L — ABNORMAL HIGH (ref 19–32)
Calcium: 10 mg/dL (ref 8.4–10.5)
Chloride: 98 mEq/L (ref 96–112)
Creatinine, Ser: 0.7 mg/dL (ref 0.40–1.20)
GFR: 87.61 mL/min (ref >60.00)
Glucose, Bld: 98 mg/dL (ref 70–99)
Potassium: 4.2 mEq/L (ref 3.5–5.1)
Sodium: 138 mEq/L (ref 135–145)
Total Bilirubin: 0.5 mg/dL (ref 0.2–1.2)
Total Protein: 7.5 g/dL (ref 6.0–8.3)

## 2022-07-27 LAB — SEDIMENTATION RATE: Sed Rate: 20 mm/hr (ref 0–30)

## 2022-07-27 LAB — C-REACTIVE PROTEIN: CRP: <1 mg/dL (ref 0.5–20.0)

## 2022-07-27 NOTE — Assessment & Plan Note (Signed)
She tried Fosamax for a year, discontinued because of body aches.  She is not interested in pharmacologic treatment. Continue adequate calcium and vitamin D supplementation. Weightbearing exercises 3-4 times per day recommended. Continue fall precautions.

## 2022-07-27 NOTE — Assessment & Plan Note (Signed)
Symptoms have been stable. Currently she is not on pharmacologic treatment, she is not interested in doing so. Encouraged smoking cessation.

## 2022-07-27 NOTE — Assessment & Plan Note (Addendum)
Continue Atorvastatin 40 mg daily and low-fat diet. Last LDL 98 in 07/2021. Further recommendations will be given according to lipid panel results.

## 2022-07-27 NOTE — Assessment & Plan Note (Addendum)
Currently on atorvastatin 40 mg daily and Aspirin 81 mg daily.

## 2022-07-27 NOTE — Assessment & Plan Note (Addendum)
We discussed the importance of regular physical activity and healthy diet for prevention of chronic illness and/or complications. Preventive guidelines reviewed. Vaccination up-to-date. Mammogram to be repeated in 09/2022. Lung cancer screening scheduled for 09/02/2022. Colonoscopy due for 12/2027. Ca++ and vit D supplementation to continue. Next CPE in a year.

## 2022-07-27 NOTE — Assessment & Plan Note (Addendum)
On 05/11/22 was 79 (82 in 04/2022). We reviewed possible etiologies. Her orthopedist recommended rheumatology consultation, she is planning on arranging appointment after coming back from trip to [place]. Today CRP and sed rate ordered.

## 2022-07-29 DIAGNOSIS — H0102B Squamous blepharitis left eye, upper and lower eyelids: Secondary | ICD-10-CM | POA: Diagnosis not present

## 2022-07-29 DIAGNOSIS — H2513 Age-related nuclear cataract, bilateral: Secondary | ICD-10-CM | POA: Diagnosis not present

## 2022-07-29 DIAGNOSIS — H1045 Other chronic allergic conjunctivitis: Secondary | ICD-10-CM | POA: Diagnosis not present

## 2022-07-29 DIAGNOSIS — D492 Neoplasm of unspecified behavior of bone, soft tissue, and skin: Secondary | ICD-10-CM | POA: Diagnosis not present

## 2022-07-29 DIAGNOSIS — H0102A Squamous blepharitis right eye, upper and lower eyelids: Secondary | ICD-10-CM | POA: Diagnosis not present

## 2022-07-29 DIAGNOSIS — H04123 Dry eye syndrome of bilateral lacrimal glands: Secondary | ICD-10-CM | POA: Diagnosis not present

## 2022-08-18 ENCOUNTER — Other Ambulatory Visit: Payer: Self-pay | Admitting: General Surgery

## 2022-08-18 DIAGNOSIS — N6081 Other benign mammary dysplasias of right breast: Secondary | ICD-10-CM

## 2022-08-23 ENCOUNTER — Ambulatory Visit (INDEPENDENT_AMBULATORY_CARE_PROVIDER_SITE_OTHER): Payer: PPO

## 2022-08-23 DIAGNOSIS — R0989 Other specified symptoms and signs involving the circulatory and respiratory systems: Secondary | ICD-10-CM | POA: Diagnosis not present

## 2022-08-30 ENCOUNTER — Other Ambulatory Visit: Payer: Self-pay | Admitting: Family Medicine

## 2022-08-30 DIAGNOSIS — I700 Atherosclerosis of aorta: Secondary | ICD-10-CM

## 2022-09-02 ENCOUNTER — Ambulatory Visit (HOSPITAL_BASED_OUTPATIENT_CLINIC_OR_DEPARTMENT_OTHER)
Admission: RE | Admit: 2022-09-02 | Discharge: 2022-09-02 | Disposition: A | Discharge status: Home / Self Care | Payer: PPO | Source: Ambulatory Visit | Attending: Family Medicine | Admitting: Family Medicine

## 2022-09-02 DIAGNOSIS — Z87891 Personal history of nicotine dependence: Secondary | ICD-10-CM | POA: Insufficient documentation

## 2022-09-02 DIAGNOSIS — F17210 Nicotine dependence, cigarettes, uncomplicated: Secondary | ICD-10-CM | POA: Diagnosis not present

## 2022-09-02 DIAGNOSIS — Z122 Encounter for screening for malignant neoplasm of respiratory organs: Secondary | ICD-10-CM | POA: Insufficient documentation

## 2022-09-13 ENCOUNTER — Other Ambulatory Visit: Payer: Self-pay | Admitting: Acute Care

## 2022-09-13 DIAGNOSIS — F17210 Nicotine dependence, cigarettes, uncomplicated: Secondary | ICD-10-CM

## 2022-09-13 DIAGNOSIS — Z6822 Body mass index (BMI) 22.0-22.9, adult: Secondary | ICD-10-CM | POA: Diagnosis not present

## 2022-09-13 DIAGNOSIS — Z87891 Personal history of nicotine dependence: Secondary | ICD-10-CM

## 2022-09-13 DIAGNOSIS — Z122 Encounter for screening for malignant neoplasm of respiratory organs: Secondary | ICD-10-CM

## 2022-09-13 DIAGNOSIS — R700 Elevated erythrocyte sedimentation rate: Secondary | ICD-10-CM | POA: Diagnosis not present

## 2022-09-13 DIAGNOSIS — M2559 Pain in other specified joint: Secondary | ICD-10-CM | POA: Diagnosis not present

## 2022-09-13 DIAGNOSIS — R5383 Other fatigue: Secondary | ICD-10-CM | POA: Diagnosis not present

## 2022-09-14 ENCOUNTER — Ambulatory Visit (HOSPITAL_COMMUNITY)
Admission: RE | Admit: 2022-09-14 | Discharge: 2022-09-14 | Disposition: A | Discharge status: Home / Self Care | Payer: PPO | Source: Ambulatory Visit | Attending: Family Medicine | Admitting: Family Medicine

## 2022-09-14 DIAGNOSIS — I708 Atherosclerosis of other arteries: Secondary | ICD-10-CM | POA: Diagnosis not present

## 2022-09-14 DIAGNOSIS — I700 Atherosclerosis of aorta: Secondary | ICD-10-CM | POA: Insufficient documentation

## 2022-09-15 ENCOUNTER — Encounter: Payer: Self-pay | Admitting: Family Medicine

## 2022-09-16 ENCOUNTER — Encounter: Payer: Self-pay | Admitting: Family Medicine

## 2022-09-16 ENCOUNTER — Ambulatory Visit (INDEPENDENT_AMBULATORY_CARE_PROVIDER_SITE_OTHER): Payer: PPO | Admitting: Family Medicine

## 2022-09-16 VITALS — BP 136/80 | HR 73 | Temp 98.4°F | Wt 135.4 lb

## 2022-09-16 DIAGNOSIS — S61512A Laceration without foreign body of left wrist, initial encounter: Secondary | ICD-10-CM

## 2022-09-16 NOTE — Progress Notes (Signed)
   Subjective:    Patient ID: Tanya [name], female    DOB: [birth year], 72 y.o.   MRN: [id number]  HPI Here to check a skin tear on the left wrist that occurred about 2 weeks ago. She was helping to pull something out of a car, and the edge struck her wrist. She has been dressing it daily with Neosporin and a Bandaid. It feels fine, but she is concerned whether the skin flap should be removed or not.    Review of Systems  Constitutional: Negative.   Respiratory: Negative.    Cardiovascular: Negative.   Skin:  Positive for wound.       Objective:   Physical Exam Constitutional:      Appearance: Normal appearance.  Cardiovascular:     Rate and Rhythm: Normal rate and regular rhythm.     Pulses: Normal pulses.     Heart sounds: Normal heart sounds.  Pulmonary:     Effort: Pulmonary effort is normal.     Breath sounds: Normal breath sounds.  Skin:    Comments: There is a V shaped skin tear on the dorsal left wrist which looks clean and is healing well. The flap is flat and attached to the wound  Neurological:     Mental Status: She is alert.           Assessment & Plan:  Skin tear. This is healing nicely and I advised her to not remove the flap. Instead of an antibiotic dressing, I advised her to use petroleum jelly. Recheck as needed.  Gershon Crane, MD

## 2022-09-19 LAB — VAS US ABI WITH/WO TBI
Left ABI: 0.95
Right ABI: 1.08

## 2022-09-27 DIAGNOSIS — R700 Elevated erythrocyte sedimentation rate: Secondary | ICD-10-CM | POA: Diagnosis not present

## 2022-09-27 DIAGNOSIS — M25512 Pain in left shoulder: Secondary | ICD-10-CM | POA: Diagnosis not present

## 2022-09-27 DIAGNOSIS — Z6822 Body mass index (BMI) 22.0-22.9, adult: Secondary | ICD-10-CM | POA: Diagnosis not present

## 2022-09-27 DIAGNOSIS — M2559 Pain in other specified joint: Secondary | ICD-10-CM | POA: Diagnosis not present

## 2022-10-05 ENCOUNTER — Ambulatory Visit
Admission: RE | Admit: 2022-10-05 | Discharge: 2022-10-05 | Disposition: A | Discharge status: Home / Self Care | Payer: PPO | Source: Ambulatory Visit | Attending: General Surgery | Admitting: General Surgery

## 2022-10-05 ENCOUNTER — Other Ambulatory Visit: Payer: Self-pay | Admitting: General Surgery

## 2022-10-05 DIAGNOSIS — N6081 Other benign mammary dysplasias of right breast: Secondary | ICD-10-CM | POA: Diagnosis not present

## 2022-10-12 DIAGNOSIS — M25561 Pain in right knee: Secondary | ICD-10-CM | POA: Diagnosis not present

## 2022-10-12 DIAGNOSIS — M25512 Pain in left shoulder: Secondary | ICD-10-CM | POA: Diagnosis not present

## 2022-10-12 DIAGNOSIS — M25511 Pain in right shoulder: Secondary | ICD-10-CM | POA: Diagnosis not present

## 2022-10-12 DIAGNOSIS — R531 Weakness: Secondary | ICD-10-CM | POA: Diagnosis not present

## 2022-10-18 ENCOUNTER — Encounter: Payer: Self-pay | Admitting: Family Medicine

## 2022-10-19 DIAGNOSIS — M25561 Pain in right knee: Secondary | ICD-10-CM | POA: Diagnosis not present

## 2022-10-19 DIAGNOSIS — M25562 Pain in left knee: Secondary | ICD-10-CM | POA: Diagnosis not present

## 2022-10-19 DIAGNOSIS — R531 Weakness: Secondary | ICD-10-CM | POA: Diagnosis not present

## 2022-10-19 DIAGNOSIS — M25512 Pain in left shoulder: Secondary | ICD-10-CM | POA: Diagnosis not present

## 2022-10-29 DIAGNOSIS — M25512 Pain in left shoulder: Secondary | ICD-10-CM | POA: Diagnosis not present

## 2022-10-29 DIAGNOSIS — M25562 Pain in left knee: Secondary | ICD-10-CM | POA: Diagnosis not present

## 2022-10-29 DIAGNOSIS — M25561 Pain in right knee: Secondary | ICD-10-CM | POA: Diagnosis not present

## 2022-10-29 DIAGNOSIS — R531 Weakness: Secondary | ICD-10-CM | POA: Diagnosis not present

## 2022-11-02 ENCOUNTER — Encounter: Payer: Self-pay | Admitting: Family Medicine

## 2022-11-02 DIAGNOSIS — M25561 Pain in right knee: Secondary | ICD-10-CM | POA: Diagnosis not present

## 2022-11-02 DIAGNOSIS — M25562 Pain in left knee: Secondary | ICD-10-CM | POA: Diagnosis not present

## 2022-11-02 DIAGNOSIS — R531 Weakness: Secondary | ICD-10-CM | POA: Diagnosis not present

## 2022-11-02 DIAGNOSIS — M25512 Pain in left shoulder: Secondary | ICD-10-CM | POA: Diagnosis not present

## 2022-11-15 DIAGNOSIS — D0461 Carcinoma in situ of skin of right upper limb, including shoulder: Secondary | ICD-10-CM | POA: Diagnosis not present

## 2022-11-15 DIAGNOSIS — D485 Neoplasm of uncertain behavior of skin: Secondary | ICD-10-CM | POA: Diagnosis not present

## 2022-11-15 DIAGNOSIS — N6081 Other benign mammary dysplasias of right breast: Secondary | ICD-10-CM | POA: Diagnosis not present

## 2022-11-15 DIAGNOSIS — Z853 Personal history of malignant neoplasm of breast: Secondary | ICD-10-CM | POA: Diagnosis not present

## 2022-11-15 DIAGNOSIS — L821 Other seborrheic keratosis: Secondary | ICD-10-CM | POA: Diagnosis not present

## 2022-11-16 DIAGNOSIS — M25561 Pain in right knee: Secondary | ICD-10-CM | POA: Diagnosis not present

## 2022-11-16 DIAGNOSIS — M25562 Pain in left knee: Secondary | ICD-10-CM | POA: Diagnosis not present

## 2022-11-16 DIAGNOSIS — R531 Weakness: Secondary | ICD-10-CM | POA: Diagnosis not present

## 2022-11-16 DIAGNOSIS — M25512 Pain in left shoulder: Secondary | ICD-10-CM | POA: Diagnosis not present

## 2022-11-22 DIAGNOSIS — M25561 Pain in right knee: Secondary | ICD-10-CM | POA: Diagnosis not present

## 2022-11-22 DIAGNOSIS — M25512 Pain in left shoulder: Secondary | ICD-10-CM | POA: Diagnosis not present

## 2022-11-22 DIAGNOSIS — M25562 Pain in left knee: Secondary | ICD-10-CM | POA: Diagnosis not present

## 2022-11-22 DIAGNOSIS — R531 Weakness: Secondary | ICD-10-CM | POA: Diagnosis not present

## 2022-12-09 DIAGNOSIS — D0461 Carcinoma in situ of skin of right upper limb, including shoulder: Secondary | ICD-10-CM | POA: Diagnosis not present

## 2022-12-09 DIAGNOSIS — L905 Scar conditions and fibrosis of skin: Secondary | ICD-10-CM | POA: Diagnosis not present

## 2022-12-09 DIAGNOSIS — C44622 Squamous cell carcinoma of skin of right upper limb, including shoulder: Secondary | ICD-10-CM | POA: Diagnosis not present

## 2023-01-27 DIAGNOSIS — M2559 Pain in other specified joint: Secondary | ICD-10-CM | POA: Diagnosis not present

## 2023-01-27 DIAGNOSIS — M25512 Pain in left shoulder: Secondary | ICD-10-CM | POA: Diagnosis not present

## 2023-01-27 DIAGNOSIS — R700 Elevated erythrocyte sedimentation rate: Secondary | ICD-10-CM | POA: Diagnosis not present

## 2023-01-27 DIAGNOSIS — Z6823 Body mass index (BMI) 23.0-23.9, adult: Secondary | ICD-10-CM | POA: Diagnosis not present

## 2023-01-27 DIAGNOSIS — M1991 Primary osteoarthritis, unspecified site: Secondary | ICD-10-CM | POA: Diagnosis not present

## 2023-03-30 ENCOUNTER — Ambulatory Visit
Admission: RE | Admit: 2023-03-30 | Discharge: 2023-03-30 | Disposition: A | Discharge status: Home / Self Care | Payer: PPO | Source: Ambulatory Visit | Attending: General Surgery | Admitting: General Surgery

## 2023-03-30 DIAGNOSIS — Z08 Encounter for follow-up examination after completed treatment for malignant neoplasm: Secondary | ICD-10-CM | POA: Diagnosis not present

## 2023-03-30 DIAGNOSIS — N6081 Other benign mammary dysplasias of right breast: Secondary | ICD-10-CM

## 2023-03-30 DIAGNOSIS — Z853 Personal history of malignant neoplasm of breast: Secondary | ICD-10-CM | POA: Diagnosis not present

## 2023-04-12 DIAGNOSIS — D239 Other benign neoplasm of skin, unspecified: Secondary | ICD-10-CM | POA: Diagnosis not present

## 2023-04-12 DIAGNOSIS — D2261 Melanocytic nevi of right upper limb, including shoulder: Secondary | ICD-10-CM | POA: Diagnosis not present

## 2023-04-12 DIAGNOSIS — R202 Paresthesia of skin: Secondary | ICD-10-CM | POA: Diagnosis not present

## 2023-04-12 DIAGNOSIS — Z86018 Personal history of other benign neoplasm: Secondary | ICD-10-CM | POA: Diagnosis not present

## 2023-04-12 DIAGNOSIS — L821 Other seborrheic keratosis: Secondary | ICD-10-CM | POA: Diagnosis not present

## 2023-04-12 DIAGNOSIS — L814 Other melanin hyperpigmentation: Secondary | ICD-10-CM | POA: Diagnosis not present

## 2023-04-12 DIAGNOSIS — L578 Other skin changes due to chronic exposure to nonionizing radiation: Secondary | ICD-10-CM | POA: Diagnosis not present

## 2023-04-12 DIAGNOSIS — D225 Melanocytic nevi of trunk: Secondary | ICD-10-CM | POA: Diagnosis not present

## 2023-04-14 DIAGNOSIS — R700 Elevated erythrocyte sedimentation rate: Secondary | ICD-10-CM | POA: Diagnosis not present

## 2023-04-14 DIAGNOSIS — M25512 Pain in left shoulder: Secondary | ICD-10-CM | POA: Diagnosis not present

## 2023-04-14 DIAGNOSIS — M2559 Pain in other specified joint: Secondary | ICD-10-CM | POA: Diagnosis not present

## 2023-04-14 DIAGNOSIS — M1991 Primary osteoarthritis, unspecified site: Secondary | ICD-10-CM | POA: Diagnosis not present

## 2023-04-14 DIAGNOSIS — Z6823 Body mass index (BMI) 23.0-23.9, adult: Secondary | ICD-10-CM | POA: Diagnosis not present

## 2023-04-14 DIAGNOSIS — M353 Polymyalgia rheumatica: Secondary | ICD-10-CM | POA: Diagnosis not present

## 2023-05-09 ENCOUNTER — Encounter: Payer: Self-pay | Admitting: Family Medicine

## 2023-06-05 ENCOUNTER — Other Ambulatory Visit: Payer: Self-pay | Admitting: Family Medicine

## 2023-06-05 DIAGNOSIS — E785 Hyperlipidemia, unspecified: Secondary | ICD-10-CM

## 2023-07-13 DIAGNOSIS — M25512 Pain in left shoulder: Secondary | ICD-10-CM | POA: Diagnosis not present

## 2023-07-13 DIAGNOSIS — M2559 Pain in other specified joint: Secondary | ICD-10-CM | POA: Diagnosis not present

## 2023-07-13 DIAGNOSIS — M353 Polymyalgia rheumatica: Secondary | ICD-10-CM | POA: Diagnosis not present

## 2023-07-13 DIAGNOSIS — R700 Elevated erythrocyte sedimentation rate: Secondary | ICD-10-CM | POA: Diagnosis not present

## 2023-07-13 DIAGNOSIS — M1991 Primary osteoarthritis, unspecified site: Secondary | ICD-10-CM | POA: Diagnosis not present

## 2023-07-13 DIAGNOSIS — Z6823 Body mass index (BMI) 23.0-23.9, adult: Secondary | ICD-10-CM | POA: Diagnosis not present

## 2023-07-14 LAB — HEPATIC FUNCTION PANEL
ALT: 21 U/L (ref 7–35)
AST: 24 (ref 13–35)
Alkaline Phosphatase: 62 (ref 25–125)
Bilirubin, Total: 0.4

## 2023-07-14 LAB — CBC AND DIFFERENTIAL
HCT: 49 — AB (ref 36–46)
Hemoglobin: 16.5 — AB (ref 12.0–16.0)
Platelets: 218 K/uL (ref 150–400)
WBC: 8.4

## 2023-07-14 LAB — BASIC METABOLIC PANEL WITH GFR
BUN: 23 — AB (ref 4–21)
CO2: 27 — AB (ref 13–22)
Chloride: 98 — AB (ref 99–108)
Creatinine: 0.7 (ref 0.5–1.1)
Glucose: 98
Potassium: 5.5 meq/L — AB (ref 3.5–5.1)
Sodium: 140 (ref 137–147)

## 2023-07-14 LAB — COMPREHENSIVE METABOLIC PANEL WITH GFR
Albumin: 4.6 (ref 3.5–5.0)
Calcium: 10.2 (ref 8.7–10.7)

## 2023-07-14 LAB — POCT ERYTHROCYTE SEDIMENTATION RATE, NON-AUTOMATED: Sed Rate: 5

## 2023-07-29 ENCOUNTER — Encounter: Payer: Self-pay | Admitting: Family Medicine

## 2023-07-29 ENCOUNTER — Ambulatory Visit: Payer: Self-pay | Admitting: Family Medicine

## 2023-07-29 ENCOUNTER — Ambulatory Visit (INDEPENDENT_AMBULATORY_CARE_PROVIDER_SITE_OTHER): Admitting: Family Medicine

## 2023-07-29 VITALS — BP 128/80 | HR 91 | Temp 98.0°F | Resp 16 | Ht 65.0 in | Wt 140.1 lb

## 2023-07-29 DIAGNOSIS — Z170 Estrogen receptor positive status [ER+]: Secondary | ICD-10-CM

## 2023-07-29 DIAGNOSIS — E785 Hyperlipidemia, unspecified: Secondary | ICD-10-CM

## 2023-07-29 DIAGNOSIS — J449 Chronic obstructive pulmonary disease, unspecified: Secondary | ICD-10-CM

## 2023-07-29 DIAGNOSIS — Z0000 Encounter for general adult medical examination without abnormal findings: Secondary | ICD-10-CM

## 2023-07-29 DIAGNOSIS — E875 Hyperkalemia: Secondary | ICD-10-CM

## 2023-07-29 DIAGNOSIS — C50412 Malignant neoplasm of upper-outer quadrant of left female breast: Secondary | ICD-10-CM

## 2023-07-29 LAB — POTASSIUM: Potassium: 4.3 meq/L (ref 3.5–5.1)

## 2023-07-29 LAB — LIPID PANEL
Cholesterol: 193 mg/dL (ref 0–200)
HDL: 97.9 mg/dL (ref >39.00)
LDL Cholesterol: 79 mg/dL (ref 0–99)
NonHDL: 94.96
Total CHOL/HDL Ratio: 2
Triglycerides: 81 mg/dL (ref 0.0–149.0)
VLDL: 16.2 mg/dL (ref 0.0–40.0)

## 2023-07-29 MED ORDER — COMBIVENT RESPIMAT 20-100 MCG/ACT IN AERS
1.0000 | INHALATION_SPRAY | Freq: Four times a day (QID) | RESPIRATORY_TRACT | 3 refills | Status: AC | PRN
Start: 2023-07-29 — End: ?

## 2023-07-29 NOTE — Assessment & Plan Note (Addendum)
 Reporting some SOB with certain activities. Albuterol innh has helped in the past. No associated symptoms. Encouraged smoking cessation. Resume Combivent  Respimat 20-100 mg 1 puff every 6 hours as needed.

## 2023-07-29 NOTE — Assessment & Plan Note (Signed)
 Completed 5 years treatment of Tamoxifen and not longer following with oncologist.  Follows with surgeon annually, last seen in 11/2022. Last mammogram 03/2023,per pt report it was  recommended to continue with annual screening mammograms.

## 2023-07-29 NOTE — Patient Instructions (Addendum)
 A few things to remember from today's visit:  Routine general medical examination at a health care facility  Hyperlipidemia, unspecified hyperlipidemia type - Plan: Lipid panel  Chronic obstructive pulmonary disease, unspecified COPD type (HCC), Chronic - Plan: Ipratropium-Albuterol (COMBIVENT  RESPIMAT) 20-100 MCG/ACT AERS respimat  Hyperkalemia - Plan: Potassium  If you need refills for medications you take chronically, please call your pharmacy. Do not use My Chart to request refills or for acute issues that need immediate attention. If you send a my chart message, it may take a few days to be addressed, specially if I am not in the office.  Please be sure medication list is accurate. If a new problem present, please set up appointment sooner than planned today.

## 2023-07-29 NOTE — Assessment & Plan Note (Signed)
 Currently on atorvastatin 40 mg daily. Further recommendation will be given according to lipid panel result.

## 2023-07-29 NOTE — Assessment & Plan Note (Addendum)
 We discussed the importance of regular physical activity and healthy diet for prevention of chronic illness and/or complications. Preventive guidelines reviewed. Vaccination up to date. Hx of osteoporosis, has declined pharmacologic treatment.Fosamax  caused body aches. Ca++ and vit D supplementation to continue. Continue fall precautions. Lung cancer screening up to date. Follows with gynecologist for her female preventive care. Next CPE in a year.

## 2023-07-29 NOTE — Progress Notes (Signed)
 HPI: Ms.Storie E Besaw is a 73 y.o. female with PMHx significant for aortic atherosclerosis, tobacco use disorder, COPD, OA, and history of left breast cancer, among some, who is here today for her routine physical.  Last CPE: 07/27/2022  Exercise: No exercise. Occasional yard work Diet: Home cooked with vegetables daily.  Sleep: 8 hours/night. Reports sleep interruptions; difficulty going back to sleep after using the bathroom around 4 AM.  Smoking: 1 ppd.  Alcohol consumption: 1-2 beers or glasses of wine daily.  Dental: UTD with routine dental care.  Vision: UTD with routine vision exams  Health Maintenance  Topic Date Due   Medicare Annual Wellness Visit  08/12/2021   Mammogram  04/02/2023   COVID-19 Vaccine (8 - 2024-25 season) 08/14/2023*   Flu Shot  08/05/2023   Screening for Lung Cancer  09/02/2023   Colon Cancer Screening  12/17/2027   Pneumococcal Vaccine for age over 81  Completed   DEXA scan (bone density measurement)  Completed   Hepatitis C Screening  Completed   Zoster (Shingles) Vaccine  Completed   Hepatitis B Vaccine  Aged Out   HPV Vaccine  Aged Out   Meningitis B Vaccine  Aged Out   DTaP/Tdap/Td vaccine  Discontinued  *Topic was postponed. The date shown is not the original due date.   Immunization History  Administered Date(s) Administered   Fluad Quad(high Dose 65+) 10/07/2020, 10/09/2021   Influenza Split 10/05/2011, 10/18/2022   Influenza Whole 09/04/2008   Influenza, High Dose Seasonal PF 10/18/2017, 10/09/2018, 10/09/2019   Influenza,inj,Quad PF,6+ Mos 10/10/2012, 09/18/2013, 12/16/2014, 10/13/2015, 09/15/2016   PFIZER(Purple Top)SARS-COV-2 Vaccination 02/09/2019, 03/07/2019, 10/15/2019   Pfizer Covid-19 Vaccine Bivalent Booster 12yrs & up 04/28/2021, 11/02/2022   Pfizer(Comirnaty)Fall Seasonal Vaccine 12 years and older 10/23/2021   Pneumococcal Conjugate-13 05/02/2017   Pneumococcal Polysaccharide-23 11/04/2008, 08/03/2018   Respiratory  Syncytial Virus Vaccine,Recomb Aduvanted(Arexvy) 11/06/2021   Unspecified SARS-COV-2 Vaccination 09/12/2020   Zoster Recombinant(Shingrix) 05/10/2017, 08/03/2017   Zoster, Live 03/17/2012   Chronic medical problems:  Hx of L breast cancer Once said she can go back to regular screening now.  Last mamm was done 03/30/2023.  Saw a Careers adviser in 11/2022.  COPD/Shortness of Breath Pt reports occasional episodes of shortness of breath.  She was previously prescribed a rescue inhaler, however she only used it once in the past.  Denies any chest pain, diaphoresis, palpitations, cough, or wheezing.   Chronic shoulder pain Overall her shoulder pain has improved.  She is established with Dr. Mai of rheumatology and follows up every 3-4 months.  Currently on Prednisone for management of shoulder pain.  She is currently being weaned off Prednisone by Dr. Mai.   Had blood work done recently, CMP and CBC, no significant abnormalities except for potassium 5.5 (07/14/2023).  Hyperlipidemia Currently on Rosuvastatin 40 mg once daily.  Good compliance and tolerance.   Lab Results  Component Value Date   CHOL 189 07/27/2022   HDL 88.50 07/27/2022   LDLCALC 81 07/27/2022   LDLDIRECT 127.4 03/13/2012   TRIG 101.0 07/27/2022   CHOLHDL 2 07/27/2022   She has no new concerns today.  Review of Systems  Constitutional:  Negative for activity change, appetite change and fever.  HENT:  Negative for mouth sores, sore throat and trouble swallowing.   Eyes:  Negative for redness and visual disturbance.  Respiratory:  Positive for shortness of breath. Negative for cough and wheezing.   Cardiovascular:  Negative for chest pain, palpitations and leg swelling.  Gastrointestinal:  Negative for abdominal pain, nausea and vomiting.  Endocrine: Negative for cold intolerance, heat intolerance, polydipsia, polyphagia and polyuria.  Genitourinary:  Negative for decreased urine volume, dysuria and hematuria.   Musculoskeletal:  Negative for gait problem and myalgias.  Skin:  Negative for color change and rash.  Allergic/Immunologic: Positive for environmental allergies.  Neurological:  Negative for seizures, syncope, weakness and headaches.  Hematological:  Negative for adenopathy. Does not bruise/bleed easily.  Psychiatric/Behavioral:  Negative for confusion. The patient is not nervous/anxious.   All other systems reviewed and are negative.  Current Outpatient Medications on File Prior to Visit  Medication Sig Dispense Refill   aspirin  EC 81 MG tablet Take 81 mg by mouth daily. Swallow whole.     atorvastatin  (LIPITOR) 40 MG tablet TAKE 1 TABLET BY MOUTH EVERY DAY 90 tablet 3   Calcium  Carbonate-Vitamin D  600-400 MG-UNIT tablet Take 1 tablet by mouth in the morning and at bedtime.     carboxymethylcellulose (REFRESH PLUS) 0.5 % SOLN Place 1 drop into both eyes 3 (three) times daily as needed (tired/dry/irritated eyes.).     cholecalciferol (VITAMIN D3) 25 MCG (1000 UNIT) tablet Take 1,000 Units by mouth in the morning and at bedtime.     Misc Natural Products (OSTEO BI-FLEX TRIPLE STRENGTH) TABS Take 1 tablet by mouth 2 (two) times daily.     Multiple Vitamin (MULTIVITAMIN WITH MINERALS) TABS tablet Take 1 tablet by mouth daily with breakfast.     predniSONE (DELTASONE) 1 MG tablet Take 4 mg by mouth daily with breakfast.     No current facility-administered medications on file prior to visit.   Past Medical History:  Diagnosis Date   Allergy    in file   Arthritis    left knee and hip   Breast cancer (HCC)    Radiation and lumpectomy  mastectomy and chemo   COPD, mild (HCC)    Diverticulosis    Family history of breast cancer    Family history of uterine cancer    Hyperlipidemia    Internal hemorrhoids    Osteopenia    DEXA 10/27/11: -1.4, no change from 10/2009   PONV (postoperative nausea and vomiting)    Severe   Shingles    Smoker     Past Surgical History:  Procedure  Laterality Date   BREAST BIOPSY Right    BREAST BIOPSY Right 04/08/2022   MM RT BREAST BX W LOC DEV 1ST LESION IMAGE BX SPEC STEREO GUIDE 04/08/2022 [facility]   BREAST LUMPECTOMY     COLONOSCOPY     DILATION AND EVACUATION     JOINT REPLACEMENT  10/28/2020   Left hip   MASTECTOMY Left 08/2014   TONSILLECTOMY     TOTAL HIP ARTHROPLASTY Left 10/28/2020   Procedure: LEFT TOTAL HIP ARTHROPLASTY ANTERIOR APPROACH;  Surgeon: Sheril Coy, MD;  Location: [facility];  Service: Orthopedics;  Laterality: Left;    Allergies  Allergen Reactions   Tetanus Toxoid Other (See Comments)    shock   Codeine Rash    Family History  Problem Relation Age of Onset   CAD Mother        stent older age   Hypertension Mother    Breast cancer Mother 37   Cancer Mother        SKIN and BLADDER    Arthritis Mother    Hypertension Father    Heart disease Father 30       CHF    Dementia Father  Heart disease Brother        Visual merchandiser   Hearing loss Maternal Grandfather    Heart disease Maternal Grandfather    Parkinson's disease Paternal Grandmother    Heart disease Paternal Grandfather    Lung cancer Paternal Grandfather     Social History   Socioeconomic History   Marital status: Widowed    Spouse name: Not on file   Number of children: 0   Years of education: Not on file   Highest education level: Bachelor's degree (e.g., BA, AB, BS)  Occupational History   Not on file  Tobacco Use   Smoking status: Every Day    Current packs/day: 1.00    Average packs/day: 1 pack/day for 49.0 years (49.0 ttl pk-yrs)    Types: Cigarettes   Smokeless tobacco: Never   Tobacco comments:    Trying to smoke less.   Vaping Use   Vaping status: Never Used  Substance and Sexual Activity   Alcohol use: Yes    Comment: daily   Drug use: No   Sexual activity: Not Currently    Partners: Male    Birth control/protection: None    Comment: older than 16, more than 5  Other Topics Concern   Not on  file  Social History Narrative   Lives at home with husband who is disabled.     Social Drivers of Corporate investment banker Strain: Low Risk  (07/25/2023)   Overall Financial Resource Strain (CARDIA)    Difficulty of Paying Living Expenses: Not hard at all  Food Insecurity: No Food Insecurity (07/25/2023)   Hunger Vital Sign    Worried About Running Out of Food in the Last Year: Never true    Ran Out of Food in the Last Year: Never true  Transportation Needs: No Transportation Needs (07/25/2023)   PRAPARE - Administrator, Civil Service (Medical): No    Lack of Transportation (Non-Medical): No  Physical Activity: Insufficiently Active (07/25/2023)   Exercise Vital Sign    Days of Exercise per Week: 1 day    Minutes of Exercise per Session: 60 min  Stress: No Stress Concern Present (07/25/2023)   [employer] of Occupational Health - Occupational Stress Questionnaire    Feeling of Stress: Not at all  Social Connections: Unknown (07/25/2023)   Social Connection and Isolation Panel    Frequency of Communication with Friends and Family: Three times a week    Frequency of Social Gatherings with Friends and Family: Patient declined    Attends Religious Services: Patient declined    Active Member of Clubs or Organizations: Patient declined    Attends Banker Meetings: Not on file    Marital Status: Widowed   Vitals:   07/29/23 0716  BP: 128/80  Pulse: 91  Resp: 16  Temp: 98 F (36.7 C)  SpO2: 95%   Body mass index is 23.32 kg/m.  Wt Readings from Last 3 Encounters:  07/29/23 140 lb 2 oz (63.6 kg)  09/16/22 135 lb 6.4 oz (61.4 kg)  09/02/22 130 lb (59 kg)   Physical Exam Vitals and nursing note reviewed.  Constitutional:      General: She is not in acute distress.    Appearance: She is well-developed.  HENT:     Head: Normocephalic and atraumatic.     Right Ear: External ear normal. Tympanic membrane is not erythematous.     Left Ear:  Tympanic membrane, ear canal and external ear normal.  Ears:     Comments: Right ear canal with cerumen excess, able to see TM partially.    Mouth/Throat:     Mouth: Mucous membranes are moist.     Pharynx: Oropharynx is clear. Uvula midline.  Eyes:     Extraocular Movements: Extraocular movements intact.     Conjunctiva/sclera: Conjunctivae normal.     Pupils: Pupils are equal, round, and reactive to light.  Neck:     Thyroid : No thyroid  mass or thyromegaly.  Cardiovascular:     Rate and Rhythm: Normal rate and regular rhythm.     Heart sounds: No murmur heard.    Comments: DP and PT pulses palpable bilaterally Pulmonary:     Effort: Pulmonary effort is normal. No respiratory distress.     Breath sounds: Normal breath sounds.  Abdominal:     Palpations: Abdomen is soft. There is no hepatomegaly or mass.     Tenderness: There is no abdominal tenderness.  Genitourinary:    Comments: Deferred to gyn. Musculoskeletal:     Comments: No major deformity or signs of synovitis appreciated.  Lymphadenopathy:     Cervical: No cervical adenopathy.     Upper Body:     Right upper body: No supraclavicular adenopathy.     Left upper body: No supraclavicular adenopathy.  Skin:    General: Skin is warm.     Findings: No erythema or rash.  Neurological:     General: No focal deficit present.     Mental Status: She is alert and oriented to person, place, and time.     Cranial Nerves: No cranial nerve deficit.     Coordination: Coordination normal.     Gait: Gait normal.     Deep Tendon Reflexes:     Reflex Scores:      Bicep reflexes are 2+ on the right side and 2+ on the left side.      Patellar reflexes are 2+ on the right side and 2+ on the left side. Psychiatric:        Mood and Affect: Mood and affect normal.   ASSESSMENT AND PLAN: Ms. ALLIZON WOZNICK was here today annual physical examination.  Orders Placed This Encounter  Procedures   Potassium   Lipid panel   Lab  Results  Component Value Date   CHOL 193 07/29/2023   HDL 97.90 07/29/2023   LDLCALC 79 07/29/2023   LDLDIRECT 127.4 03/13/2012   TRIG 81.0 07/29/2023   CHOLHDL 2 07/29/2023   Routine general medical examination at a health care facility Assessment & Plan: We discussed the importance of regular physical activity and healthy diet for prevention of chronic illness and/or complications. Preventive guidelines reviewed. Vaccination up to date. Hx of osteoporosis, has declined pharmacologic treatment.Fosamax  caused body aches. Ca++ and vit D supplementation to continue. Continue fall precautions. Lung cancer screening up to date. Follows with gynecologist for her female preventive care. Next CPE in a year.   Hyperlipidemia, unspecified hyperlipidemia type Assessment & Plan: Currently on atorvastatin  40 mg daily. Further recommendation will be given according to lipid panel result.  Orders: -     Lipid panel; Future  Chronic obstructive pulmonary disease, unspecified COPD type (HCC) Assessment & Plan: Reporting some SOB with certain activities. Albuterol innh has helped in the past. No associated symptoms. Encouraged smoking cessation. Resume Combivent  Respimat 20-100 mg 1 puff every 6 hours as needed.  Orders: -     Combivent  Respimat; Inhale 1 puff into the lungs every 6 (six)  hours as needed for wheezing.  Dispense: 4 g; Refill: 3  Hyperkalemia -     Potassium; Future  Malignant neoplasm of upper-outer quadrant of left breast in female, estrogen receptor positive (HCC) Assessment & Plan: Completed 5 years treatment of Tamoxifen and not longer following with oncologist.  Follows with surgeon annually, last seen in 11/2022. Last mammogram 03/2023,per pt report it was  recommended to continue with annual screening mammograms.   Return in 1 year (on 07/28/2024) for CPE.  I, Vernell Forest, acting as a scribe for Geoff Dacanay [place], MD., have documented all relevant documentation on  the behalf of Matayah Reyburn [place], MD, as directed by   while in the presence of Laquan Beier [place], MD.  I, Faustino Luecke [place], MD, have reviewed all documentation for this visit. The documentation on 07/29/23 for the exam, diagnosis, procedures, and orders are all accurate and complete.  Blane Worthington G. [place], MD  [facility]. [place] office.

## 2023-08-10 DIAGNOSIS — H0102A Squamous blepharitis right eye, upper and lower eyelids: Secondary | ICD-10-CM | POA: Diagnosis not present

## 2023-08-10 DIAGNOSIS — H0102B Squamous blepharitis left eye, upper and lower eyelids: Secondary | ICD-10-CM | POA: Diagnosis not present

## 2023-08-10 DIAGNOSIS — H2513 Age-related nuclear cataract, bilateral: Secondary | ICD-10-CM | POA: Diagnosis not present

## 2023-08-10 DIAGNOSIS — H1045 Other chronic allergic conjunctivitis: Secondary | ICD-10-CM | POA: Diagnosis not present

## 2023-08-10 DIAGNOSIS — D492 Neoplasm of unspecified behavior of bone, soft tissue, and skin: Secondary | ICD-10-CM | POA: Diagnosis not present

## 2023-08-10 DIAGNOSIS — H04123 Dry eye syndrome of bilateral lacrimal glands: Secondary | ICD-10-CM | POA: Diagnosis not present

## 2023-09-03 ENCOUNTER — Ambulatory Visit (HOSPITAL_BASED_OUTPATIENT_CLINIC_OR_DEPARTMENT_OTHER)
Admission: RE | Admit: 2023-09-03 | Discharge: 2023-09-03 | Disposition: A | Discharge status: Home / Self Care | Source: Ambulatory Visit | Attending: Acute Care | Admitting: Acute Care

## 2023-09-03 DIAGNOSIS — Z122 Encounter for screening for malignant neoplasm of respiratory organs: Secondary | ICD-10-CM | POA: Diagnosis not present

## 2023-09-03 DIAGNOSIS — Z87891 Personal history of nicotine dependence: Secondary | ICD-10-CM | POA: Diagnosis not present

## 2023-09-03 DIAGNOSIS — F17210 Nicotine dependence, cigarettes, uncomplicated: Secondary | ICD-10-CM | POA: Insufficient documentation

## 2023-09-12 ENCOUNTER — Encounter: Payer: Self-pay | Admitting: Family Medicine

## 2023-09-15 ENCOUNTER — Other Ambulatory Visit: Payer: Self-pay

## 2023-09-15 ENCOUNTER — Encounter: Payer: Self-pay | Admitting: Family Medicine

## 2023-09-15 DIAGNOSIS — F17210 Nicotine dependence, cigarettes, uncomplicated: Secondary | ICD-10-CM

## 2023-09-15 DIAGNOSIS — Z87891 Personal history of nicotine dependence: Secondary | ICD-10-CM

## 2023-09-15 DIAGNOSIS — Z122 Encounter for screening for malignant neoplasm of respiratory organs: Secondary | ICD-10-CM

## 2023-09-20 ENCOUNTER — Encounter: Payer: Self-pay | Admitting: Obstetrics and Gynecology

## 2023-09-20 NOTE — Progress Notes (Unsigned)
 73 y.o. G31P0030 Widowed Caucasian female here for a breast and pelvic exam. Would like to get a pap smear.    The patient is also followed for osteopenia, history of breast cancer.  PCP: [place], [name], MD   No LMP recorded. Patient is postmenopausal.           Sexually active: No.  The current method of family planning is post menopausal status.    Menopausal hormone therapy:  n/a Exercising: Yes.    Yard work  Smoker:  yes  OB History     Gravida  3   Para  0   Term      Preterm      AB  3   Living  0      SAB  1   IAB  2   Ectopic      Multiple      Live Births              HEALTH MAINTENANCE: Last 2 paps: 09/02/21 neg, 08/21/19 neg History of abnormal Pap or positive HPV:  no Mammogram:  03/30/23 Breast Density Cat C, BIRADS Cat 2 benign  Colonoscopy:  12/16/17 due 10 years  Bone Density:  02/01/22  Result  osteopenia right and left hip.  FRAX 16%/4.4%    Immunization History  Administered Date(s) Administered   Fluad Quad(high Dose 65+) 10/07/2020, 10/09/2021   INFLUENZA, HIGH DOSE SEASONAL PF 10/18/2017, 10/09/2018, 10/09/2019   Influenza Split 10/05/2011, 10/18/2022   Influenza Whole 09/04/2008   Influenza,inj,Quad PF,6+ Mos 10/10/2012, 09/18/2013, 12/16/2014, 10/13/2015, 09/15/2016   PFIZER(Purple Top)SARS-COV-2 Vaccination 02/09/2019, 03/07/2019, 10/15/2019   Pfizer Covid-19 Vaccine Bivalent Booster 47yrs & up 04/28/2021, 11/02/2022   Pfizer(Comirnaty)Fall Seasonal Vaccine 12 years and older 10/23/2021   Pneumococcal Conjugate-13 05/02/2017   Pneumococcal Polysaccharide-23 11/04/2008, 08/03/2018   Respiratory Syncytial Virus Vaccine,Recomb Aduvanted(Arexvy) 11/06/2021   Unspecified SARS-COV-2 Vaccination 09/12/2020   Zoster Recombinant(Shingrix) 05/10/2017, 08/03/2017   Zoster, Live 03/17/2012      reports that she has been smoking cigarettes. She has a 49 pack-year smoking history. She has never used smokeless tobacco. She reports  current alcohol use. She reports that she does not use drugs.  Past Medical History:  Diagnosis Date   Allergy    in file   Arthritis    left knee and hip   Breast cancer (HCC)    Radiation and lumpectomy  mastectomy and chemo   COPD, mild (HCC)    Diverticulosis    Family history of breast cancer    Hyperlipidemia    Internal hemorrhoids    Osteopenia    DEXA 10/27/11: -1.4, no change from 10/2009   PONV (postoperative nausea and vomiting)    Severe   Shingles    Smoker     Past Surgical History:  Procedure Laterality Date   BREAST BIOPSY Right    BREAST BIOPSY Right 04/08/2022   MM RT BREAST BX W LOC DEV 1ST LESION IMAGE BX SPEC STEREO GUIDE 04/08/2022 [facility]   BREAST LUMPECTOMY     COLONOSCOPY     DILATION AND EVACUATION     JOINT REPLACEMENT  10/28/2020   Left hip   MASTECTOMY Left 08/2014   TONSILLECTOMY     TOTAL HIP ARTHROPLASTY Left 10/28/2020   Procedure: LEFT TOTAL HIP ARTHROPLASTY ANTERIOR APPROACH;  Surgeon: Sheril Coy, MD;  Location: [facility];  Service: Orthopedics;  Laterality: Left;    Current Outpatient Medications  Medication Sig Dispense Refill   aspirin  EC  81 MG tablet Take 81 mg by mouth daily. Swallow whole.     atorvastatin  (LIPITOR) 40 MG tablet TAKE 1 TABLET BY MOUTH EVERY DAY 90 tablet 3   Calcium  Carbonate-Vitamin D  600-400 MG-UNIT tablet Take 1 tablet by mouth in the morning and at bedtime.     carboxymethylcellulose (REFRESH PLUS) 0.5 % SOLN Place 1 drop into both eyes 3 (three) times daily as needed (tired/dry/irritated eyes.).     cholecalciferol (VITAMIN D3) 25 MCG (1000 UNIT) tablet Take 1,000 Units by mouth in the morning and at bedtime.     Ipratropium-Albuterol (COMBIVENT  RESPIMAT) 20-100 MCG/ACT AERS respimat Inhale 1 puff into the lungs every 6 (six) hours as needed for wheezing. 4 g 3   Misc Natural Products (OSTEO BI-FLEX TRIPLE STRENGTH) TABS Take 1 tablet by mouth 2 (two) times daily.     Multiple Vitamin  (MULTIVITAMIN WITH MINERALS) TABS tablet Take 1 tablet by mouth daily with breakfast.     predniSONE (DELTASONE) 1 MG tablet Take 4 mg by mouth daily with breakfast. (Patient taking differently: Take 4 mg by mouth daily with breakfast. Now taking 2 mg, for 1 month; then tapering off to 1 mg per month; then zero)     No current facility-administered medications for this visit.    ALLERGIES: Tetanus toxoid and Codeine  Family History  Problem Relation Age of Onset   CAD Mother        stent older age   Hypertension Mother    Breast cancer Mother 42   Cancer Mother        SKIN and BLADDER    Arthritis Mother    Hypertension Father    Heart disease Father 79       CHF    Dementia Father    Heart disease Brother        pace maker   Hearing loss Maternal Grandfather    Heart disease Maternal Grandfather    Parkinson's disease Paternal Grandmother    Heart disease Paternal Grandfather    Lung cancer Paternal Grandfather     Review of Systems  All other systems reviewed and are negative.   PHYSICAL EXAM:  BP 124/72 (BP Location: Right Arm, Patient Position: Sitting)   Pulse 100   Ht 5' 5.25 (1.657 m)   Wt 142 lb (64.4 kg)   SpO2 97%   BMI 23.45 kg/m     General appearance: alert, cooperative and appears stated age Head: normocephalic, without obvious abnormality, atraumatic Neck: no adenopathy, supple, symmetrical, trachea midline and thyroid  normal to inspection and palpation Lungs: clear to auscultation bilaterally Breasts: right - normal appearance, no masses or tenderness, No nipple retraction or dimpling, No nipple discharge or bleeding, No axillary adenopathy Left - absent Heart: regular rate and rhythm Abdomen: soft, non-tender; no masses, no organomegaly Extremities: extremities normal, atraumatic, no cyanosis or edema Skin: skin color, texture, turgor normal. No rashes or lesions Lymph nodes: cervical, supraclavicular, and axillary nodes normal. Neurologic:  grossly normal  Pelvic: External genitalia:  no lesions              No abnormal inguinal nodes palpated.              Urethra:  normal appearing urethra with no masses, tenderness or lesions              Bartholins and Skenes: normal                 Vagina: normal appearing vagina with  normal color and discharge, no lesions              Cervix: no lesions              Pap taken: yes Bimanual Exam:  Uterus:  normal size, contour, position, consistency, mobility, non-tender              Adnexa: no mass, fullness, tenderness              Rectal exam: yes.  Confirms.              Anus:  normal sphincter tone, no lesions  Chaperone was present for exam:  Kari HERO, CMA  ASSESSMENT: Encounter for breast and pelvic exam.  Other specified risk factors.  Personal history of other medical treatment.  Flat epithelial atypia of right breast. Hx left breast cancer.  Status post left mastectomy.  Osteopenia.  Alendronate  caused joint pain.  Took Zometa  once in 2016 and did ok. Increased risk of hip fracture by FRAX. Takes steroids prn.  Smoker. FH breast and bladder cancer.   PLAN: Mammogram screening discussed. Self breast awareness reviewed. Pap and reflex HRV collected:  yes Guidelines for Calcium , Vitamin D , regular exercise program including cardiovascular and weight bearing exercise. Medication refills:  NA Prior BMD and FRAX discussed. Will get BMD at [facility] per patient preference and then consider Reclast .  Labs with PCP.  Follow up:  yearly and prn.     Additional counseling given.  yes. 25 min  total time was spent for this patient encounter, including preparation, face-to-face counseling with the patient, coordination of care, and documentation of the encounter in addition to doing the breast and pelvic exam.   ADDENDUM:  Encounter corrected per patient request to reflect that blood pressure was taken in the right arm and to correct family history of bladder cancer and not  uterine cancer in her mother.

## 2023-09-21 ENCOUNTER — Other Ambulatory Visit (HOSPITAL_COMMUNITY)
Admission: RE | Admit: 2023-09-21 | Discharge: 2023-09-21 | Disposition: A | Discharge status: Home / Self Care | Source: Ambulatory Visit | Attending: Obstetrics and Gynecology | Admitting: Obstetrics and Gynecology

## 2023-09-21 ENCOUNTER — Ambulatory Visit (INDEPENDENT_AMBULATORY_CARE_PROVIDER_SITE_OTHER): Admitting: Obstetrics and Gynecology

## 2023-09-21 ENCOUNTER — Encounter: Payer: Self-pay | Admitting: Obstetrics and Gynecology

## 2023-09-21 ENCOUNTER — Encounter: Payer: Self-pay | Admitting: Oncology

## 2023-09-21 VITALS — BP 124/72 | HR 100 | Ht 65.25 in | Wt 142.0 lb

## 2023-09-21 DIAGNOSIS — Z9189 Other specified personal risk factors, not elsewhere classified: Secondary | ICD-10-CM

## 2023-09-21 DIAGNOSIS — B029 Zoster without complications: Secondary | ICD-10-CM

## 2023-09-21 DIAGNOSIS — Z853 Personal history of malignant neoplasm of breast: Secondary | ICD-10-CM | POA: Diagnosis not present

## 2023-09-21 DIAGNOSIS — Z01419 Encounter for gynecological examination (general) (routine) without abnormal findings: Secondary | ICD-10-CM

## 2023-09-21 DIAGNOSIS — Z780 Asymptomatic menopausal state: Secondary | ICD-10-CM

## 2023-09-21 DIAGNOSIS — Z9289 Personal history of other medical treatment: Secondary | ICD-10-CM

## 2023-09-21 DIAGNOSIS — Z124 Encounter for screening for malignant neoplasm of cervix: Secondary | ICD-10-CM | POA: Diagnosis not present

## 2023-09-21 NOTE — Patient Instructions (Addendum)
 Provided phone number for scheduling bone density at [facility], [phone].     EXERCISE AND DIET:  We recommended that you start or continue a regular exercise program for good health. Regular exercise means any activity that makes your heart beat faster and makes you sweat.  We recommend exercising at least 30 minutes per day at least 3 days a week, preferably 4 or 5.  We also recommend a diet low in fat and sugar.  Inactivity, poor dietary choices and obesity can cause diabetes, heart attack, stroke, and kidney damage, among others.    ALCOHOL AND SMOKING:  Women should limit their alcohol intake to no more than 7 drinks/beers/glasses of wine (combined, not each!) per week. Moderation of alcohol intake to this level decreases your risk of breast cancer and liver damage. And of course, no recreational drugs are part of a healthy lifestyle.  And absolutely no smoking or even second hand smoke. Most people know smoking can cause heart and lung diseases, but did you know it also contributes to weakening of your bones? Aging of your skin?  Yellowing of your teeth and nails?  CALCIUM  AND VITAMIN D :  Adequate intake of calcium  and Vitamin D  are recommended.  The recommendations for exact amounts of these supplements seem to change often, but generally speaking 600 mg of calcium  (either carbonate or citrate) and 800 units of Vitamin D  per day seems prudent. Certain women may benefit from higher intake of Vitamin D .  If you are among these women, your doctor will have told you during your visit.    PAP SMEARS:  Pap smears, to check for cervical cancer or precancers,  have traditionally been done yearly, although recent scientific advances have shown that most women can have pap smears less often.  However, every woman still should have a physical exam from her gynecologist every year. It will include a breast check, inspection of the vulva and vagina to check for abnormal growths or skin changes, a visual exam  of the cervix, and then an exam to evaluate the size and shape of the uterus and ovaries.  And after 71 years of age, a rectal exam is indicated to check for rectal cancers. We will also provide age appropriate advice regarding health maintenance, like when you should have certain vaccines, screening for sexually transmitted diseases, bone density testing, colonoscopy, mammograms, etc.   MAMMOGRAMS:  All women over 40 years old should have a yearly mammogram. Many facilities now offer a 3D mammogram, which may cost around $50 extra out of pocket. If possible,  we recommend you accept the option to have the 3D mammogram performed.  It both reduces the number of women who will be called back for extra views which then turn out to be normal, and it is better than the routine mammogram at detecting truly abnormal areas.    COLONOSCOPY:  Colonoscopy to screen for colon cancer is recommended for all women at age 15.  We know, you hate the idea of the prep.  We agree, BUT, having colon cancer and not knowing it is worse!!  Colon cancer so often starts as a polyp that can be seen and removed at colonscopy, which can quite literally save your life!  And if your first colonoscopy is normal and you have no family history of colon cancer, most women don't have to have it again for 10 years.  Once every ten years, you can do something that may end up saving your life, right?  We will be happy to help you get it scheduled when you are ready.  Be sure to check your insurance coverage so you understand how much it will cost.  It may be covered as a preventative service at no cost, but you should check your particular policy.    Zoledronic  Acid Injection (Bone Disorders) What is this medication? ZOLEDRONIC  ACID (ZOE le dron ik AS id) prevents and treats osteoporosis. It may also be used to treat Paget's disease of the bone. It works by Interior and spatial designer stronger and less likely to break (fracture). It belongs to a group of  medications called bisphosphonates. This medicine may be used for other purposes; ask your health care provider or pharmacist if you have questions. COMMON BRAND NAME(S): Reclast  What should I tell my care team before I take this medication? They need to know if you have any of these conditions: Bleeding disorder Cancer Dental disease Kidney disease Low levels of calcium  in the blood Low red blood cell counts Lung or breathing disease, such as asthma Receiving steroids, such as dexamethasone  or prednisone An unusual or allergic reaction to zoledronic  acid, other medications, foods, dyes, or preservatives Pregnant or trying to get pregnant Breast-feeding How should I use this medication? This medication is injected into a vein. It is given by your care team in a hospital or clinic setting. A special MedGuide will be given to you before each treatment. Be sure to read this information carefully each time. Talk to your care team about the use of this medication in children. Special care may be needed. Overdosage: If you think you have taken too much of this medicine contact a poison control center or emergency room at once. NOTE: This medicine is only for you. Do not share this medicine with others. What if I miss a dose? Keep appointments for follow-up doses. It is important not to miss your dose. Call your care team if you are unable to keep an appointment. What may interact with this medication? Certain antibiotics given by injection Medications for pain and inflammation, such as ibuprofen, naproxen, NSAIDs Some diuretics, such as bumetanide, furosemide Teriparatide This list may not describe all possible interactions. Give your health care provider a list of all the medicines, herbs, non-prescription drugs, or dietary supplements you use. Also tell them if you smoke, drink alcohol, or use illegal drugs. Some items may interact with your medicine. What should I watch for while using  this medication? Visit your care team for regular checks on your progress. It may be some time before you see the benefit from this medication. Some people who take this medication have severe bone, joint, or muscle pain. This medication may also increase your risk for jaw problems or a broken thigh bone. Tell your care team right away if you have severe pain in your jaw, bones, joints, or muscles. Tell your care team if you have any pain that does not go away or that gets worse. You should make sure you get enough calcium  and vitamin D  while you are taking this medication. Discuss the foods you eat and the vitamins you take with your care team. You may need bloodwork while taking this medication. Tell your dentist and dental surgeon that you are taking this medication. You should not have major dental surgery while on this medication. See your dentist to have a dental exam and fix any dental problems before starting this medication. Take good care of your teeth while on this medication. Make  sure you see your dentist for regular follow-up appointments. What side effects may I notice from receiving this medication? Side effects that you should report to your care team as soon as possible: Allergic reactions--skin rash, itching, hives, swelling of the face, lips, tongue, or throat Kidney injury--decrease in the amount of urine, swelling of the ankles, hands, or feet Low calcium  level--muscle pain or cramps, confusion, tingling, or numbness in the hands or feet Osteonecrosis of the jaw--pain, swelling, or redness in the mouth, numbness of the jaw, poor healing after dental work, unusual discharge from the mouth, visible bones in the mouth Severe bone, joint, or muscle pain Side effects that usually do not require medical attention (report to your care team if they continue or are bothersome): Diarrhea Dizziness Headache Nausea Stomach pain Vomiting This list may not describe all possible side  effects. Call your doctor for medical advice about side effects. You may report side effects to FDA at 1-800-FDA-1088. Where should I keep my medication? This medication is given in a hospital or clinic. It will not be stored at home. NOTE: This sheet is a summary. It may not cover all possible information. If you have questions about this medicine, talk to your doctor, pharmacist, or health care provider.  2024 Elsevier/Gold Standard (2021-02-06 00:00:00)  Denosumab Injection (Osteoporosis) What is this medication? DENOSUMAB (den oh SUE mab) prevents and treats osteoporosis. It works by Interior and spatial designer stronger and less likely to break (fracture). It is a monoclonal antibody. This medicine may be used for other purposes; ask your health care provider or pharmacist if you have questions. COMMON BRAND NAME(S): Prolia What should I tell my care team before I take this medication? They need to know if you have any of these conditions: Dental or gum disease Had thyroid  or parathyroid (glands located in neck) surgery Having dental surgery or a tooth pulled Kidney disease Low levels of calcium  in the blood On dialysis Poor nutrition Thyroid  disease Trouble absorbing nutrients from your food An unusual or allergic reaction to denosumab, other medications, foods, dyes, or preservatives Pregnant or trying to get pregnant Breastfeeding How should I use this medication? This medication is injected under the skin. It is given by your care team in a hospital or clinic setting. A special MedGuide will be given to you before each treatment. Be sure to read this information carefully each time. Talk to your care team about the use of this medication in children. Special care may be needed. Overdosage: If you think you have taken too much of this medicine contact a poison control center or emergency room at once. NOTE: This medicine is only for you. Do not share this medicine with others. What if I  miss a dose? Keep appointments for follow-up doses. It is important not to miss your dose. Call your care team if you are unable to keep an appointment. What may interact with this medication? Do not take this medication with any of the following: Other medications that contain denosumab This medication may also interact with the following: Medications that lower your chance of fighting infection Steroid medications, such as prednisone or cortisone This list may not describe all possible interactions. Give your health care provider a list of all the medicines, herbs, non-prescription drugs, or dietary supplements you use. Also tell them if you smoke, drink alcohol, or use illegal drugs. Some items may interact with your medicine. What should I watch for while using this medication? Your condition will be monitored  carefully while you are receiving this medication. You may need blood work done while taking this medication. This medication may increase your risk of getting an infection. Call your care team for advice if you get a fever, chills, sore throat, or other symptoms of a cold or flu. Do not treat yourself. Try to avoid being around people who are sick. Tell your dentist and dental surgeon that you are taking this medication. You should not have major dental surgery while on this medication. See your dentist to have a dental exam and fix any dental problems before starting this medication. Take good care of your teeth while on this medication. Make sure you see your dentist for regular follow-up appointments. This medication may cause low levels of calcium  in your body. The risk of severe side effects is increased in people with kidney disease. Your care team may prescribe calcium  and vitamin D  to help prevent low calcium  levels while you take this medication. It is important to take calcium  and vitamin D  as directed by your care team. Talk to your care team if you may be pregnant. Serious birth  defects may occur if you take this medication during pregnancy and for 5 months after the last dose. You will need a negative pregnancy test before starting this medication. Contraception is recommended while taking this medication and for 5 months after the last dose. Your care team can help you find the option that works for you. Talk to your care team before breastfeeding. Changes to your treatment plan may be needed. What side effects may I notice from receiving this medication? Side effects that you should report to your care team as soon as possible: Allergic reactions--skin rash, itching, hives, swelling of the face, lips, tongue, or throat Infection--fever, chills, cough, sore throat, wounds that don't heal, pain or trouble when passing urine, general feeling of discomfort or being unwell Low calcium  level--muscle pain or cramps, confusion, tingling, or numbness in the hands or feet Osteonecrosis of the jaw--pain, swelling, or redness in the mouth, numbness of the jaw, poor healing after dental work, unusual discharge from the mouth, visible bones in the mouth Severe bone, joint, or muscle pain Skin infection--skin redness, swelling, warmth, or pain Side effects that usually do not require medical attention (report these to your care team if they continue or are bothersome): Back pain Headache Joint pain Muscle pain Pain in the hands, arms, legs, or feet Runny or stuffy nose Sore throat This list may not describe all possible side effects. Call your doctor for medical advice about side effects. You may report side effects to FDA at 1-800-FDA-1088. Where should I keep my medication? This medication is given in a hospital or clinic. It will not be stored at home. NOTE: This sheet is a summary. It may not cover all possible information. If you have questions about this medicine, talk to your doctor, pharmacist, or health care provider.  2024 Elsevier/Gold Standard (2022-01-26 00:00:00)

## 2023-09-21 NOTE — Addendum Note (Signed)
 Addended by: CATHLYN JAYSON CARY, BOBIE E on: 09/21/2023 09:51 AM   Modules accepted: Orders

## 2023-09-22 ENCOUNTER — Encounter: Payer: Self-pay | Admitting: Obstetrics and Gynecology

## 2023-09-22 LAB — CYTOLOGY - PAP: Diagnosis: NEGATIVE

## 2023-09-23 ENCOUNTER — Encounter: Payer: Self-pay | Admitting: Oncology

## 2023-09-29 ENCOUNTER — Ambulatory Visit: Payer: Self-pay | Admitting: Obstetrics and Gynecology

## 2023-10-11 ENCOUNTER — Encounter: Payer: Self-pay | Admitting: Family Medicine

## 2023-10-31 ENCOUNTER — Encounter: Payer: Self-pay | Admitting: Family Medicine

## 2023-11-15 DIAGNOSIS — M353 Polymyalgia rheumatica: Secondary | ICD-10-CM | POA: Diagnosis not present

## 2023-11-15 DIAGNOSIS — M2559 Pain in other specified joint: Secondary | ICD-10-CM | POA: Diagnosis not present

## 2023-11-15 DIAGNOSIS — Z6823 Body mass index (BMI) 23.0-23.9, adult: Secondary | ICD-10-CM | POA: Diagnosis not present

## 2023-11-15 DIAGNOSIS — M25512 Pain in left shoulder: Secondary | ICD-10-CM | POA: Diagnosis not present

## 2023-11-15 DIAGNOSIS — M1991 Primary osteoarthritis, unspecified site: Secondary | ICD-10-CM | POA: Diagnosis not present

## 2023-11-15 DIAGNOSIS — R700 Elevated erythrocyte sedimentation rate: Secondary | ICD-10-CM | POA: Diagnosis not present

## 2024-04-30 ENCOUNTER — Ambulatory Visit (HOSPITAL_BASED_OUTPATIENT_CLINIC_OR_DEPARTMENT_OTHER)
